# Patient Record
Sex: Female | Born: 1990 | Race: Black or African American | Hispanic: No | Smoking: Never smoker
Health system: Southern US, Community
[De-identification: ages and names within clinical notes are randomized; demographics above are authoritative.]

## PROBLEM LIST (undated history)

## (undated) DIAGNOSIS — Z8669 Personal history of other diseases of the nervous system and sense organs: Secondary | ICD-10-CM

## (undated) DIAGNOSIS — Z86711 Personal history of pulmonary embolism: Secondary | ICD-10-CM

## (undated) DIAGNOSIS — F419 Anxiety disorder, unspecified: Secondary | ICD-10-CM

## (undated) DIAGNOSIS — F32A Depression, unspecified: Secondary | ICD-10-CM

## (undated) DIAGNOSIS — N946 Dysmenorrhea, unspecified: Secondary | ICD-10-CM

## (undated) DIAGNOSIS — I6310 Cerebral infarction due to embolism of unspecified precerebral artery: Secondary | ICD-10-CM

## (undated) DIAGNOSIS — G809 Cerebral palsy, unspecified: Secondary | ICD-10-CM

## (undated) DIAGNOSIS — T7840XA Allergy, unspecified, initial encounter: Secondary | ICD-10-CM

## (undated) DIAGNOSIS — Z8742 Personal history of other diseases of the female genital tract: Secondary | ICD-10-CM

## (undated) HISTORY — DX: Personal history of other diseases of the nervous system and sense organs: Z86.69

## (undated) HISTORY — DX: Anxiety disorder, unspecified: F41.9

## (undated) HISTORY — DX: Personal history of pulmonary embolism: Z86.711

## (undated) HISTORY — DX: Cerebral palsy, unspecified: G80.9

## (undated) HISTORY — DX: Allergy, unspecified, initial encounter: T78.40XA

## (undated) HISTORY — DX: Dysmenorrhea, unspecified: N94.6

## (undated) HISTORY — DX: Personal history of other diseases of the female genital tract: Z87.42

## (undated) HISTORY — DX: Depression, unspecified: F32.A

## (undated) HISTORY — PX: COLPOSCOPY: SHX161

## (undated) HISTORY — DX: Cerebral infarction due to embolism of unspecified precerebral artery: I63.10

---

## 2017-09-18 ENCOUNTER — Ambulatory Visit: Payer: Self-pay | Admitting: Emergency Medicine

## 2017-09-20 ENCOUNTER — Encounter: Payer: Self-pay | Admitting: Obstetrics and Gynecology

## 2017-09-20 ENCOUNTER — Other Ambulatory Visit: Payer: Self-pay

## 2017-09-20 ENCOUNTER — Ambulatory Visit: Payer: BC Managed Care – PPO | Admitting: Obstetrics and Gynecology

## 2017-09-20 VITALS — BP 122/80 | HR 104 | Resp 14 | Ht 64.5 in | Wt 194.0 lb

## 2017-09-20 DIAGNOSIS — Z7189 Other specified counseling: Secondary | ICD-10-CM

## 2017-09-20 DIAGNOSIS — Z8669 Personal history of other diseases of the nervous system and sense organs: Secondary | ICD-10-CM

## 2017-09-20 DIAGNOSIS — Z833 Family history of diabetes mellitus: Secondary | ICD-10-CM | POA: Diagnosis not present

## 2017-09-20 DIAGNOSIS — Z01419 Encounter for gynecological examination (general) (routine) without abnormal findings: Secondary | ICD-10-CM

## 2017-09-20 DIAGNOSIS — Z124 Encounter for screening for malignant neoplasm of cervix: Secondary | ICD-10-CM | POA: Diagnosis not present

## 2017-09-20 DIAGNOSIS — Z7185 Encounter for immunization safety counseling: Secondary | ICD-10-CM

## 2017-09-20 DIAGNOSIS — Z0000 Encounter for general adult medical examination without abnormal findings: Secondary | ICD-10-CM

## 2017-09-20 DIAGNOSIS — Z23 Encounter for immunization: Secondary | ICD-10-CM | POA: Diagnosis not present

## 2017-09-20 MED ORDER — BLISOVI 24 FE 1-20 MG-MCG(24) PO TABS: 1.0000 | ORAL_TABLET | Freq: Every day | ORAL | 3 refills | Status: DC

## 2017-09-20 NOTE — Progress Notes (Signed)
27 y.o. G0P0000 MarriedAfrican AmericanF here for annual exam.   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 Mom had preeclampsia.   Menses q month, occasionally skipped cycles on OCP's. Bleeds x 3-4 day, light. No cramps with OCP's.  Sexually active, no pain. Same partner x 10 years, married in 2016. No current plans for children. Husband is getting his PhD in educational psychology.    Patient's last menstrual period was 09/12/2017.          Sexually active: Yes.    The current method of family planning is OCP (estrogen/progesterone).    Exercising: No.  The patient does not participate in regular exercise at present. Smoker:  no  Health Maintenance: Pap:  2017 WNL per patient  History of abnormal Pap:  Yes 2016 + HR HPV - had colposcopy TDaP:  unsure Gardasil: no    reports that  has never smoked. she has never used smokeless tobacco. She reports that she drinks about 0.6 oz of alcohol per week. She reports that she does not use drugs. 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 Prefers to work with [employer]. Just finished her masters degree in curriculum and instruction. Thinking about changing her job.   Past Medical History:  Diagnosis Date  . Cerebral palsy (HCC)   . Dysmenorrhea   . History of abnormal cervical Pap smear    + HPV  . 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 COLPOSCOPY      Current Outpatient Medications  Medication Sig Dispense Refill  . BLISOVI 24 FE 1-20 MG-MCG(24) tablet TAKE 1 TAB BY MOUTH DAILY. CALL OFFICE FOR APPOINTMENT.  0   No current facility-administered medications for this visit.     Family History  Problem Relation Age of Onset  . Hypertension Mother   . Diabetes Mother     Review of Systems  Constitutional: Negative.   HENT: Negative.   Eyes: Negative.   Respiratory: Negative.   Cardiovascular: Negative.   Gastrointestinal:  Negative.   Endocrine: Negative.   Genitourinary: Negative.   Musculoskeletal: Negative.   Skin: Negative.   Allergic/Immunologic: Negative.   Neurological: Negative.   Psychiatric/Behavioral: Negative.     Exam:   BP 122/80 (BP Location: Right Arm, Patient Position: Sitting, Cuff Size: Normal)   Pulse (!) 104   Resp 14   Ht 5' 4.5" (1.638 m)   Wt 194 lb (88 kg)   LMP 09/12/2017   BMI 32.79 kg/m   Weight change: @WEIGHTCHANGE @ Height:   Height: 5' 4.5" (163.8 cm)  Ht Readings from Last 3 Encounters:  09/20/17 5' 4.5" (1.638 m)    General appearance: alert, cooperative and appears stated age Head: Normocephalic, without obvious abnormality, atraumatic Neck: no adenopathy, supple, symmetrical, trachea midline and thyroid normal to inspection and palpation Lungs: clear to auscultation bilaterally Cardiovascular: regular rate and rhythm Breasts: normal appearance, no masses or tenderness Abdomen: soft, non-tender; non distended,  no masses,  no organomegaly Extremities: extremities normal, atraumatic, no cyanosis or edema Skin: Skin color, texture, turgor normal. No rashes or lesions Lymph nodes: Cervical, supraclavicular, and axillary nodes normal. No abnormal inguinal nodes palpated Neurologic: Grossly normal   Pelvic: External genitalia:  no lesions              Urethra:  normal appearing urethra with no masses, tenderness or lesions  Bartholins and Skenes: normal                 Vagina: normal appearing vagina with normal color and discharge, no lesions              Cervix: no lesions               Bimanual Exam:  Uterus:  normal size, contour, position, consistency, mobility, non-tender and anteverted              Adnexa: no mass, fullness, tenderness               Rectovaginal: Confirms               Anus:  normal sphincter tone, no lesions  Chaperone was present for exam.  A:  Well Woman with normal exam  Contraception, happy with OCP's  H/O  +HPV  H/O preterm birth, stroke, has very mild CP  P:   Pap with reflex hpv  Continue OCP's  TDAP  Gardasil, will start the series today  Screening labs  Discussed breast self exam  Discussed calcium and vit D intake

## 2017-09-20 NOTE — Patient Instructions (Signed)
EXERCISE AND DIET:  We recommended that you start or continue a regular exercise program for good health. Regular exercise means any activity that makes your heart beat faster and makes you sweat.  We recommend exercising at least 30 minutes per day at least 3 days a week, preferably 4 or 5.  We also recommend a diet low in fat and sugar.  Inactivity, poor dietary choices and obesity can cause diabetes, heart attack, stroke, and kidney damage, among others.    ALCOHOL AND SMOKING:  Women should limit their alcohol intake to no more than 7 drinks/beers/glasses of wine (combined, not each!) per week. Moderation of alcohol intake to this level decreases your risk of breast cancer and liver damage. And of course, no recreational drugs are part of a healthy lifestyle.  And absolutely no smoking or even second hand smoke. Most people know smoking can cause heart and lung diseases, but did you know it also contributes to weakening of your bones? Aging of your skin?  Yellowing of your teeth and nails?  CALCIUM AND VITAMIN D:  Adequate intake of calcium and Vitamin D are recommended.  The recommendations for exact amounts of these supplements seem to change often, but generally speaking 600 mg of calcium (either carbonate or citrate) and 800 units of Vitamin D per day seems prudent. Certain women may benefit from higher intake of Vitamin D.  If you are among these women, your doctor will have told you during your visit.    PAP SMEARS:  Pap smears, to check for cervical cancer or precancers,  have traditionally been done yearly, although recent scientific advances have shown that most women can have pap smears less often.  However, every woman still should have a physical exam from her gynecologist every year. It will include a breast check, inspection of the vulva and vagina to check for abnormal growths or skin changes, a visual exam of the cervix, and then an exam to evaluate the size and shape of the uterus and  ovaries.  And after 27 years of age, a rectal exam is indicated to check for rectal cancers. We will also provide age appropriate advice regarding health maintenance, like when you should have certain vaccines, screening for sexually transmitted diseases, bone density testing, colonoscopy, mammograms, etc.   MAMMOGRAMS:  All women over 40 years old should have a yearly mammogram. Many facilities now offer a "3D" mammogram, which may cost around $50 extra out of pocket. If possible,  we recommend you accept the option to have the 3D mammogram performed.  It both reduces the number of women who will be called back for extra views which then turn out to be normal, and it is better than the routine mammogram at detecting truly abnormal areas.    COLONOSCOPY:  Colonoscopy to screen for colon cancer is recommended for all women at age 50.  We know, you hate the idea of the prep.  We agree, BUT, having colon cancer and not knowing it is worse!!  Colon cancer so often starts as a polyp that can be seen and removed at colonscopy, which can quite literally save your life!  And if your first colonoscopy is normal and you have no family history of colon cancer, most women don't have to have it again for 10 years.  Once every ten years, you can do something that may end up saving your life, right?  We will be happy to help you get it scheduled when you are ready.    Be sure to check your insurance coverage so you understand how much it will cost.  It may be covered as a preventative service at no cost, but you should check your particular policy.      Breast Self-Awareness Breast self-awareness means being familiar with how your breasts look and feel. It involves checking your breasts regularly and reporting any changes to your health care provider. Practicing breast self-awareness is important. A change in your breasts can be a sign of a serious medical problem. Being familiar with how your breasts look and feel allows  you to find any problems early, when treatment is more likely to be successful. All women should practice breast self-awareness, including women who have had breast implants. How to do a breast self-exam One way to learn what is normal for your breasts and whether your breasts are changing is to do a breast self-exam. To do a breast self-exam: Look for Changes  1. Remove all the clothing above your waist. 2. Stand in front of a mirror in a room with good lighting. 3. Put your hands on your hips. 4. Push your hands firmly downward. 5. Compare your breasts in the mirror. Look for differences between them (asymmetry), such as: ? Differences in shape. ? Differences in size. ? Puckers, dips, and bumps in one breast and not the other. 6. Look at each breast for changes in your skin, such as: ? Redness. ? Scaly areas. 7. Look for changes in your nipples, such as: ? Discharge. ? Bleeding. ? Dimpling. ? Redness. ? A change in position. Feel for Changes  Carefully feel your breasts for lumps and changes. It is best to do this while lying on your back on the floor and again while sitting or standing in the shower or tub with soapy water on your skin. Feel each breast in the following way:  Place the arm on the side of the breast you are examining above your head.  Feel your breast with the other hand.  Start in the nipple area and make  inch (2 cm) overlapping circles to feel your breast. Use the pads of your three middle fingers to do this. Apply light pressure, then medium pressure, then firm pressure. The light pressure will allow you to feel the tissue closest to the skin. The medium pressure will allow you to feel the tissue that is a little deeper. The firm pressure will allow you to feel the tissue close to the ribs.  Continue the overlapping circles, moving downward over the breast until you feel your ribs below your breast.  Move one finger-width toward the center of the body.  Continue to use the  inch (2 cm) overlapping circles to feel your breast as you move slowly up toward your collarbone.  Continue the up and down exam using all three pressures until you reach your armpit.  Write Down What You Find  Write down what is normal for each breast and any changes that you find. Keep a written record with breast changes or normal findings for each breast. By writing this information down, you do not need to depend only on memory for size, tenderness, or location. Write down where you are in your menstrual cycle, if you are still menstruating. If you are having trouble noticing differences in your breasts, do not get discouraged. With time you will become more familiar with the variations in your breasts and more comfortable with the exam. How often should I examine my breasts? Examine   your breasts every month. If you are breastfeeding, the best time to examine your breasts is after a feeding or after using a breast pump. If you menstruate, the best time to examine your breasts is 5-7 days after your period is over. During your period, your breasts are lumpier, and it may be more difficult to notice changes. When should I see my health care provider? See your health care provider if you notice:  A change in shape or size of your breasts or nipples.  A change in the skin of your breast or nipples, such as a reddened or scaly area.  Unusual discharge from your nipples.  A lump or thick area that was not there before.  Pain in your breasts.  Anything that concerns you.  This information is not intended to replace advice given to you by your health care provider. Make sure you discuss any questions you have with your health care provider. Document Released: 07/10/2005 Document Revised: 12/16/2015 Document Reviewed: 05/30/2015 Elsevier Interactive Patient Education  2018 Elsevier Inc.  

## 2017-09-21 ENCOUNTER — Other Ambulatory Visit (HOSPITAL_COMMUNITY)
Admission: RE | Admit: 2017-09-21 | Discharge: 2017-09-21 | Disposition: A | Discharge status: Home / Self Care | Payer: BC Managed Care – PPO | Source: Ambulatory Visit | Attending: Obstetrics and Gynecology | Admitting: Obstetrics and Gynecology

## 2017-09-21 DIAGNOSIS — Z124 Encounter for screening for malignant neoplasm of cervix: Secondary | ICD-10-CM | POA: Insufficient documentation

## 2017-09-21 LAB — COMPREHENSIVE METABOLIC PANEL
A/G RATIO: 1.1 — AB (ref 1.2–2.2)
ALK PHOS: 43 IU/L (ref 39–117)
ALT: 11 IU/L (ref 0–32)
AST: 14 IU/L (ref 0–40)
Albumin: 4.1 g/dL (ref 3.5–5.5)
BUN/Creatinine Ratio: 12 (ref 9–23)
BUN: 9 mg/dL (ref 6–20)
Bilirubin Total: 0.2 mg/dL (ref 0.0–1.2)
CALCIUM: 9.6 mg/dL (ref 8.7–10.2)
CHLORIDE: 103 mmol/L (ref 96–106)
CO2: 25 mmol/L (ref 20–29)
Creatinine, Ser: 0.74 mg/dL (ref 0.57–1.00)
GFR calc Af Amer: 128 mL/min/{1.73_m2} (ref >59)
GFR, EST NON AFRICAN AMERICAN: 111 mL/min/{1.73_m2} (ref >59)
Globulin, Total: 3.9 g/dL (ref 1.5–4.5)
Glucose: 76 mg/dL (ref 65–99)
POTASSIUM: 4 mmol/L (ref 3.5–5.2)
SODIUM: 140 mmol/L (ref 134–144)
Total Protein: 8 g/dL (ref 6.0–8.5)

## 2017-09-21 LAB — HEMOGLOBIN A1C
Est. average glucose Bld gHb Est-mCnc: 103 mg/dL
Hgb A1c MFr Bld: 5.2 % (ref 4.8–5.6)

## 2017-09-21 LAB — CBC
Hematocrit: 40 % (ref 34.0–46.6)
Hemoglobin: 13 g/dL (ref 11.1–15.9)
MCH: 30.1 pg (ref 26.6–33.0)
MCHC: 32.5 g/dL (ref 31.5–35.7)
MCV: 93 fL (ref 79–97)
PLATELETS: 449 10*3/uL — AB (ref 150–379)
RBC: 4.32 x10E6/uL (ref 3.77–5.28)
RDW: 12.8 % (ref 12.3–15.4)
WBC: 8.5 10*3/uL (ref 3.4–10.8)

## 2017-09-21 LAB — LIPID PANEL
CHOLESTEROL TOTAL: 135 mg/dL (ref 100–199)
Chol/HDL Ratio: 2.8 ratio (ref 0.0–4.4)
HDL: 49 mg/dL (ref >39)
LDL Calculated: 67 mg/dL (ref 0–99)
TRIGLYCERIDES: 93 mg/dL (ref 0–149)
VLDL Cholesterol Cal: 19 mg/dL (ref 5–40)

## 2017-09-21 NOTE — Addendum Note (Signed)
Addended by: Tobi BastosJERTSON, JILL E on: 09/21/2017 05:11 PM   Modules accepted: Orders

## 2017-09-25 LAB — CYTOLOGY - PAP: Diagnosis: NEGATIVE

## 2017-10-17 ENCOUNTER — Other Ambulatory Visit: Payer: Self-pay

## 2017-10-17 ENCOUNTER — Ambulatory Visit: Payer: BC Managed Care – PPO | Admitting: Emergency Medicine

## 2017-10-17 ENCOUNTER — Encounter: Payer: Self-pay | Admitting: Emergency Medicine

## 2017-10-17 VITALS — BP 123/81 | HR 85 | Temp 98.6°F | Resp 16 | Ht 65.25 in | Wt 193.2 lb

## 2017-10-17 DIAGNOSIS — M25551 Pain in right hip: Secondary | ICD-10-CM | POA: Insufficient documentation

## 2017-10-17 DIAGNOSIS — G8191 Hemiplegia, unspecified affecting right dominant side: Secondary | ICD-10-CM | POA: Insufficient documentation

## 2017-10-17 DIAGNOSIS — M545 Low back pain, unspecified: Secondary | ICD-10-CM

## 2017-10-17 DIAGNOSIS — Z7689 Persons encountering health services in other specified circumstances: Secondary | ICD-10-CM

## 2017-10-17 DIAGNOSIS — M25552 Pain in left hip: Secondary | ICD-10-CM | POA: Diagnosis not present

## 2017-10-17 DIAGNOSIS — Z8669 Personal history of other diseases of the nervous system and sense organs: Secondary | ICD-10-CM

## 2017-10-17 DIAGNOSIS — M549 Dorsalgia, unspecified: Secondary | ICD-10-CM | POA: Insufficient documentation

## 2017-10-17 NOTE — Patient Instructions (Addendum)
     IF you received an x-ray today, you will receive an invoice from [facility]. Please contact [facility] at [phone] with questions or concerns regarding your invoice.   IF you received labwork today, you will receive an invoice from [facility]. Please contact [facility] at [phone] with questions or concerns regarding your invoice.   Our billing staff will not be able to assist you with questions regarding bills from these companies.  You will be contacted with the lab results as soon as they are available. The fastest way to get your results is to activate your My Chart account. Instructions are located on the last page of this paperwork. If you have not heard from us regarding the results in 2 weeks, please contact this office.     Hip Pain The hip is the joint between the upper legs and the lower pelvis. The bones, cartilage, tendons, and muscles of your hip joint support your body and allow you to move around. Hip pain can range from a minor ache to severe pain in one or both of your hips. The pain may be felt on the inside of the hip joint near the groin, or the outside near the buttocks and upper thigh. You may also have swelling or stiffness. Follow these instructions at home: Managing pain, stiffness, and swelling  If directed, apply ice to the injured area. ? Put ice in a plastic bag. ? Place a towel between your skin and the bag. ? Leave the ice on for 20 minutes, 2-3 times a day  Sleep with a pillow between your legs on your most comfortable side.  Avoid any activities that cause pain. General instructions  Take over-the-counter and prescription medicines only as told by your health care provider.  Do any exercises as told by your health care provider.  Record the following: ? How often you have hip pain. ? The location of your pain. ? What the pain feels like. ? What makes the pain worse.  Keep all follow-up visits as told by your health  care provider. This is important. Contact a health care provider if:  You cannot put weight on your leg.  Your pain or swelling continues or gets worse after one week.  It gets harder to walk.  You have a fever. Get help right away if:  You fall.  You have a sudden increase in pain and swelling in your hip.  Your hip is red or swollen or very tender to touch. Summary  Hip pain can range from a minor ache to severe pain in one or both of your hips.  The pain may be felt on the inside of the hip joint near the groin, or the outside near the buttocks and upper thigh.  Avoid any activities that cause pain.  Record how often you have hip pain, the location of the pain, what makes it worse and what it feels like. This information is not intended to replace advice given to you by your health care provider. Make sure you discuss any questions you have with your health care provider. Document Released: 12/28/2009 Document Revised: 06/12/2016 Document Reviewed: 06/12/2016 Elsevier Interactive Patient Education  2018 Elsevier Inc.  

## 2017-10-17 NOTE — Progress Notes (Signed)
Elizabeth Stanton 27 y.o.   Chief Complaint  Patient presents with  . Establish Care    PATIENT HAS HEMIPLEGIA,  REFERRAL - PATIENT WILL DISCUSS REASON    HISTORY OF PRESENT ILLNESS: This is a 27 y.o. female here to establish care.  Has a history of CP with right-sided hemiplegia.  Concerned about her low back and hips.  States she was told right hip is higher than the left hip.  Sometimes after sitting down for 20-30 minutes she gets up, feels sharp pain to her hips, feels unsteady, legs feel weak, but has some trouble walking.  After short while symptoms get better.  Requesting referral to orthopedics.  Recently seen by her GYN doctor who did some blood work and a Pap smear.  All results within normal limits.  Reviewed with patient. HPI   Prior to Admission medications   Medication Sig Start Date End Date Taking? Authorizing Provider  BLISOVI 24 FE 1-20 MG-MCG(24) tablet Take 1 tablet by mouth daily. 09/20/17  Yes Romualdo Bolk, MD    Allergies  Allergen Reactions  . Lavender Oil Hives    Patient Active Problem List   Diagnosis Date Noted  . History of cerebral palsy     Past Medical History:  Diagnosis Date  . Cerebral palsy (HCC)   . Dysmenorrhea   . History of abnormal cervical Pap smear    + HPV  . History of cerebral palsy   . 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 COLPOSCOPY      Social History   Socioeconomic History  . Marital status: Married    Spouse name: Not on file  . Number of children: Not on file  . Years of education: Not on file  . Highest education level: Not on file  Occupational History  . Not on file  Social Needs  . Financial resource strain: Not on file  . Food insecurity:    Worry: Not on file    Inability: Not on file  . Transportation needs:    Medical: Not on file    Non-medical: Not on file  Tobacco Use  . Smoking status: Never Smoker  . Smokeless  tobacco: Never Used  Substance and Sexual Activity  . Alcohol use: Yes    Alcohol/week: 0.6 oz    Types: 1 Standard drinks or equivalent per week  . Drug use: No  . Sexual activity: Yes    Partners: Male    Birth control/protection: Pill  Lifestyle  . Physical activity:    Days per week: Not on file    Minutes per session: Not on file  . Stress: Not on file  Relationships  . Social connections:    Talks on phone: Not on file    Gets together: Not on file    Attends religious service: Not on file    Active member of club or organization: Not on file    Attends meetings of clubs or organizations: Not on file    Relationship status: Not on file  . Intimate partner violence:    Fear of current or ex partner: Not on file    Emotionally abused: Not on file    Physically abused: Not on file    Forced sexual activity: Not on file  Other Topics Concern  . Not on file  Social History Narrative  . Not on file    Family History  Problem Relation Age of Onset  . Hypertension Mother   . Diabetes Mother      Review of Systems  Constitutional: Negative.  Negative for chills, fever and weight loss.  HENT: Negative.  Negative for congestion, nosebleeds and sore throat.   Eyes: Negative.  Negative for blurred vision and double vision.  Respiratory: Negative.  Negative for cough, hemoptysis, shortness of breath and wheezing.   Cardiovascular: Negative.  Negative for chest pain, palpitations and leg swelling.  Gastrointestinal: Negative.  Negative for abdominal pain, blood in stool, diarrhea, heartburn, melena, nausea and vomiting.  Genitourinary: Negative.  Negative for dysuria and hematuria.  Musculoskeletal: Positive for back pain and joint pain (Bilateral hips).  Skin: Negative.  Negative for rash.  Neurological: Negative.  Negative for dizziness, sensory change, focal weakness, weakness and headaches.  Endo/Heme/Allergies: Negative.   All other systems reviewed and are  negative.   Vitals:   10/17/17 0837  BP: 123/81  Pulse: 85  Resp: 16  Temp: 98.6 F (37 C)  SpO2: 98%    Physical Exam  Constitutional: She is oriented to person, place, and time. She appears well-developed and well-nourished.  HENT:  Head: Normocephalic and atraumatic.  Right Ear: External ear normal.  Left Ear: External ear normal.  Nose: Nose normal.  Mouth/Throat: Oropharynx is clear and moist.  Eyes: Pupils are equal, round, and reactive to light. Conjunctivae and EOM are normal.  Neck: Normal range of motion. Neck supple. No thyromegaly present.  Cardiovascular: Normal rate, regular rhythm and normal heart sounds.  Pulmonary/Chest: Effort normal and breath sounds normal. No respiratory distress.  Abdominal: Soft. Bowel sounds are normal. She exhibits no distension. There is no tenderness.  Musculoskeletal:       Lumbar back: She exhibits decreased range of motion and tenderness. She exhibits no bony tenderness, no spasm and normal pulse.  Hips: Nontender.  Fairly good range of motion.  Lymphadenopathy:    She has no cervical adenopathy.  Neurological: She is alert and oriented to person, place, and time. No sensory deficit. She exhibits normal muscle tone.  Right-sided hemiplegia.  Some atrophy noted on the right arm.  Skin: Skin is warm and dry. Capillary refill takes less than 2 seconds. No rash noted.  Psychiatric: She has a normal mood and affect. Her behavior is normal.  Vitals reviewed.  A total of 30 minutes was spent in the room with the patient, greater than 50% of which was in counseling/coordination of care.   ASSESSMENT & PLAN: Elizabeth Stanton was seen today for establish care.  Diagnoses and all orders for this visit:  Lumbar pain -     Ambulatory referral to Orthopedic Surgery  Bilateral hip pain -     Ambulatory referral to Orthopedic Surgery  History of cerebral palsy  Right hemiplegia (HCC)  Encounter to establish care    Patient Instructions        IF you received an x-ray today, you will receive an invoice from [facility]. Please contact [facility] at [phone] with questions or concerns regarding your invoice.   IF you received labwork today, you will receive an invoice from [facility]. Please contact [facility] at [phone] with questions or concerns regarding your invoice.   Our billing staff will not be able to assist you with questions regarding bills from these companies.  You will be contacted with the lab results as soon as they are available. The fastest way to get your results is to activate your My Chart account. Instructions  are located on the last page of this paperwork. If you have not heard from [place] regarding the results in 2 weeks, please contact this office.     Hip Pain The hip is the joint between the upper legs and the lower pelvis. The bones, cartilage, tendons, and muscles of your hip joint support your body and allow you to move around. Hip pain can range from a minor ache to severe pain in one or both of your hips. The pain may be felt on the inside of the hip joint near the groin, or the outside near the buttocks and upper thigh. You may also have swelling or stiffness. Follow these instructions at home: Managing pain, stiffness, and swelling  If directed, apply ice to the injured area. ? Put ice in a plastic bag. ? Place a towel between your skin and the bag. ? Leave the ice on for 20 minutes, 2-3 times a day  Sleep with a pillow between your legs on your most comfortable side.  Avoid any activities that cause pain. General instructions  Take over-the-counter and prescription medicines only as told by your health care provider.  Do any exercises as told by your health care provider.  Record the following: ? How often you have hip pain. ? The location of your pain. ? What the pain feels like. ? What makes the pain worse.  Keep all follow-up visits as told by your  health care provider. This is important. Contact a health care provider if:  You cannot put weight on your leg.  Your pain or swelling continues or gets worse after one week.  It gets harder to walk.  You have a fever. Get help right away if:  You fall.  You have a sudden increase in pain and swelling in your hip.  Your hip is red or swollen or very tender to touch. Summary  Hip pain can range from a minor ache to severe pain in one or both of your hips.  The pain may be felt on the inside of the hip joint near the groin, or the outside near the buttocks and upper thigh.  Avoid any activities that cause pain.  Record how often you have hip pain, the location of the pain, what makes it worse and what it feels like. This information is not intended to replace advice given to you by your health care provider. Make sure you discuss any questions you have with your health care provider. Document Released: 12/28/2009 Document Revised: 06/12/2016 Document Reviewed: 06/12/2016 Elsevier Interactive Patient Education  2018 Elsevier Inc.      Edwina Barth, MD [facility]

## 2017-10-23 ENCOUNTER — Ambulatory Visit (INDEPENDENT_AMBULATORY_CARE_PROVIDER_SITE_OTHER): Payer: BC Managed Care – PPO | Admitting: Orthopaedic Surgery

## 2017-10-29 ENCOUNTER — Other Ambulatory Visit (INDEPENDENT_AMBULATORY_CARE_PROVIDER_SITE_OTHER): Payer: Self-pay | Admitting: Radiology

## 2017-10-29 ENCOUNTER — Ambulatory Visit (INDEPENDENT_AMBULATORY_CARE_PROVIDER_SITE_OTHER): Payer: BC Managed Care – PPO | Admitting: Orthopaedic Surgery

## 2017-10-29 ENCOUNTER — Ambulatory Visit (INDEPENDENT_AMBULATORY_CARE_PROVIDER_SITE_OTHER): Payer: Self-pay

## 2017-10-29 ENCOUNTER — Encounter (INDEPENDENT_AMBULATORY_CARE_PROVIDER_SITE_OTHER): Payer: Self-pay | Admitting: Orthopaedic Surgery

## 2017-10-29 VITALS — BP 120/84 | HR 80 | Resp 18 | Ht 63.0 in | Wt 190.0 lb

## 2017-10-29 DIAGNOSIS — G8929 Other chronic pain: Secondary | ICD-10-CM | POA: Diagnosis not present

## 2017-10-29 DIAGNOSIS — M545 Low back pain, unspecified: Secondary | ICD-10-CM

## 2017-10-29 NOTE — Progress Notes (Signed)
Office Visit Note   Patient: Elizabeth SimpsonCaptoria Schwegel           Date of Birth: 12/12/1990           MRN: [id number] Visit Date: 10/29/2017              Requested by: Georgina QuintSagardia, Miguel Jose, MD [address] [place], [place] [id number] PCP: Georgina QuintSagardia, Miguel Jose, MD   Assessment & Plan: Visit Diagnoses:  1. Chronic midline low back pain without sciatica     Plan: Probable musculoligamentous origin for low back pain without evidence of radiculopathy.  Long discussion regarding x-ray findings and need for physical therapy.  We will set this up and check her back in 6 weeks or sooner if there is no improvement.  Follow-Up Instructions: No follow-ups on file.   Orders:  Orders Placed This Encounter  Procedures  . XR Lumbar Spine 2-3 Views  . XR Pelvis 1-2 Views   No orders of the defined types were placed in this encounter.     Procedures: No procedures performed   Clinical Data: No additional findings.   Subjective: Chief Complaint  Patient presents with  . Left Hip - Pain  . Right Hip - Pain  . Right Knee - Pain  . Left Knee - Pain  . Lower Back - Pain  . New Patient (Initial Visit)    LOWER BACK, BIL LAT HIP AND KNEE PAIN FOR 10 YEARS. HEMOPLESIA   Mrs. Elizabeth Stanton relates at least a 5-year history of recurrent low back pain.  Pain  seems to originate lower part of her back and radiate to the one buttock or the other.  Occasionally she has had some knee pain.  No radicular symptoms.  Just returned with her husband from a road trip to [place] and notes that she has had an exacerbation of her pain to the point where she is compromised.  She is having trouble sleeping.  Has tried some over-the-counter medicines. Has a history of hemiplegia  since birth affecting more of her right upper than her right lower extremity.  Denies any hip discomfort or problem with her back up until 5 years ago  HPI  Review of Systems  Constitutional: Negative for fever.  HENT: Negative for ear pain.   Eyes:  Negative for pain.  Respiratory: Negative for cough and shortness of breath.   Cardiovascular: Negative for leg swelling.  Gastrointestinal: Negative for constipation and diarrhea.  Genitourinary: Negative for difficulty urinating.  Musculoskeletal: Positive for back pain. Negative for neck pain.  Skin: Negative for rash.  Allergic/Immunologic: Negative for food allergies.  Neurological: Positive for weakness. Negative for dizziness, numbness and headaches.  Hematological: Bruises/bleeds easily.  Psychiatric/Behavioral: Negative for sleep disturbance.     Objective: Vital Signs: BP 120/84 (BP Location: Left Arm, Patient Position: Sitting, Cuff Size: Normal)   Pulse 80   Resp 18   Ht 5\' 3"  (1.6 m)   Wt 190 lb (86.2 kg)   LMP 10/10/2017   BMI 33.66 kg/m   Physical Exam  Ortho Exam awake alert and oriented x3.  Comfortable sitting hyperreflexic right upper and lower extremity.  Straight leg raise negative.  Some percussible tenderness of the lumbar spine.  Painless range of motion of both hips with internal and external rotation.  Functions without braces.  Atrophy of both right upper and right lower extremity related to her neurologic deficits. Right upper extremity postures with increased reflexes.  Positive stretch reflexes in both upper and lower extremities  walks with a mildly spastic limp to right lower extremity.  Motor exam intact right foot with active dorsiflexion plantarflexion inversion and Specialty Comments:  No specialty comments available.  Imaging: No results found.   PMFS History: Patient Active Problem List   Diagnosis Date Noted  . Lumbar pain 10/17/2017  . Bilateral hip pain 10/17/2017  . Right hemiplegia (HCC) 10/17/2017  . History of cerebral palsy    Past Medical History:  Diagnosis Date  . Cerebral palsy (HCC)   . Dysmenorrhea   . History of abnormal cervical Pap smear    + HPV  . History of cerebral palsy   . 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 Hypertension Mother   . Diabetes Mother   . Hypertension Maternal Grandmother   . Cancer Paternal Grandfather        skin    Past Surgical History:  Procedure Laterality Date  . COLPOSCOPY     Social History   Occupational History  . Not on file  Tobacco Use  . Smoking status: Never Smoker  . Smokeless tobacco: Never Used  Substance and Sexual Activity  . Alcohol use: Yes    Alcohol/week: 0.6 oz    Types: 1 Standard drinks or equivalent per week    Comment: wine  . Drug use: No  . Sexual activity: Yes    Partners: Male    Birth control/protection: Pill

## 2017-11-19 ENCOUNTER — Ambulatory Visit (INDEPENDENT_AMBULATORY_CARE_PROVIDER_SITE_OTHER): Payer: BC Managed Care – PPO

## 2017-11-19 VITALS — BP 138/80 | HR 76 | Ht 64.5 in | Wt 194.0 lb

## 2017-11-19 DIAGNOSIS — Z23 Encounter for immunization: Secondary | ICD-10-CM

## 2017-11-19 NOTE — Progress Notes (Signed)
Patient here today for 2nd Gardasil vaccine which is within dates.  BC--OCPs  Patient given Gardasil vaccine in Left deltoid and tolerated injection well. Her next injection will be due in 4 months.

## 2017-11-20 ENCOUNTER — Ambulatory Visit: Payer: BC Managed Care – PPO | Admitting: Physical Therapy

## 2018-01-02 ENCOUNTER — Encounter: Payer: Self-pay | Admitting: Obstetrics and Gynecology

## 2018-01-02 ENCOUNTER — Telehealth: Payer: Self-pay | Admitting: Obstetrics and Gynecology

## 2018-01-02 NOTE — Telephone Encounter (Signed)
Message   ----- Message from Mychart, Generic sent at 01/02/2018 12:54 AM EDT -----    Hello Dr. Oscar LaJertson,     I was looking at my blood work and was just wondering if these results also tested for STIs or HIV. If not is there a way that I can request that I be tested. I have only been with my husband however, I am not sure that I have ever been tested. Thank you in advance for your response.

## 2018-01-11 ENCOUNTER — Ambulatory Visit: Payer: BC Managed Care – PPO | Admitting: Obstetrics and Gynecology

## 2018-02-19 ENCOUNTER — Ambulatory Visit (INDEPENDENT_AMBULATORY_CARE_PROVIDER_SITE_OTHER): Payer: BC Managed Care – PPO

## 2018-02-19 VITALS — BP 115/68 | HR 68 | Resp 14 | Ht 63.0 in | Wt 196.0 lb

## 2018-02-19 DIAGNOSIS — Z23 Encounter for immunization: Secondary | ICD-10-CM

## 2018-02-19 NOTE — Progress Notes (Signed)
Patient in today for 3rd Gardasil injection.   Contraception: OCP LMP: 02/05/18 Last AEX: 09/20/17 with JJ  Injection given in Left Deltoid  Patient tolerated shot well.   Patient informed this is her last injection  Advised patient, if not on birth control, to return for next injection with cycle.   Routed to provider for final review.  Encounter closed.

## 2018-08-11 ENCOUNTER — Other Ambulatory Visit: Payer: Self-pay | Admitting: Obstetrics and Gynecology

## 2018-08-12 NOTE — Telephone Encounter (Signed)
Medication refill request: BLISOVI Last AEX:  09/20/17 JJ Next AEX: 10/10/18 Last MMG (if hormonal medication request): n/a Refill authorized: 09/20/17 #3 w/3 refills; today please advise. Quantity and number of refills adjusted in order to last patient until her AEX.

## 2018-09-05 ENCOUNTER — Other Ambulatory Visit: Payer: Self-pay | Admitting: Obstetrics and Gynecology

## 2018-09-16 ENCOUNTER — Other Ambulatory Visit: Payer: Self-pay

## 2018-09-16 ENCOUNTER — Ambulatory Visit (HOSPITAL_COMMUNITY)
Admission: EM | Admit: 2018-09-16 | Discharge: 2018-09-16 | Disposition: A | Discharge status: Home / Self Care | Payer: BC Managed Care – PPO | Attending: Family Medicine | Admitting: Family Medicine

## 2018-09-16 ENCOUNTER — Encounter (HOSPITAL_COMMUNITY): Payer: Self-pay | Admitting: Emergency Medicine

## 2018-09-16 DIAGNOSIS — J069 Acute upper respiratory infection, unspecified: Secondary | ICD-10-CM

## 2018-09-16 MED ORDER — TRIAMCINOLONE ACETONIDE 55 MCG/ACT NA AERO: 2.0000 | INHALATION_SPRAY | Freq: Every day | NASAL | 0 refills | Status: DC

## 2018-09-16 MED ORDER — BENZONATATE 200 MG PO CAPS: 200.0000 mg | ORAL_CAPSULE | Freq: Two times a day (BID) | ORAL | 0 refills | Status: DC | PRN

## 2018-09-16 NOTE — ED Triage Notes (Signed)
Cough and both ears hurting "feels like fluid in ears".  Having pain behind right eye.  No known fever, non -productive cough, has throat drainage

## 2018-09-16 NOTE — Discharge Instructions (Signed)
Rest Lots of fluids.  Tea and honey is a good idea Take Tessalon 2 times a day.  This is for cough Use Nasacort to help with the sinus congestion and drainage Expect improvement over the next few days

## 2018-09-16 NOTE — ED Provider Notes (Signed)
[facility]    CSN: [id number] Arrival date & time: 09/16/18  1641     History   Chief Complaint Chief Complaint  Patient presents with  . Cough  . Appointment    5:00pm    HPI Elizabeth Stanton is a 28 y.o. female.   HPI  Patient is here with nasal pressure, nasal congestion, postnasal drip.  Ear pressure and discomfort.  Sore throat.  She has coughing and some clear mucus.  No chest pain.  Low-grade fever.  No sweats or chills.  No body aches.  She has a headache.  She is a Runner, broadcasting/film/video.  This is been going on for 2 or 3 days.    Past Medical History:  Diagnosis Date  . Cerebral palsy (HCC)   . Dysmenorrhea   . History of abnormal cervical Pap smear    + HPV  . History of cerebral palsy   . 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 [facility])     Patient Active Problem List   Diagnosis Date Noted  . Lumbar pain 10/17/2017  . Bilateral hip pain 10/17/2017  . Right hemiplegia (HCC) 10/17/2017  . History of cerebral palsy     Past Surgical History:  Procedure Laterality Date  . COLPOSCOPY      OB History    Gravida  0   Para  0   Term  0   Preterm  0   AB  0   Living  0     SAB  0   TAB  0   Ectopic  0   Multiple  0   Live Births  0            Home Medications    Prior to Admission medications   Medication Sig Start Date End Date Taking? Authorizing Provider  benzonatate (TESSALON) 200 MG capsule Take 1 capsule (200 mg total) by mouth 2 (two) times daily as needed for cough. 09/16/18   Eustace Moore, MD  BLISOVI 24 FE 1-20 MG-MCG(24) tablet TAKE 1 TABLET BY MOUTH EVERY DAY 08/13/18   Romualdo Bolk, MD  triamcinolone (NASACORT) 55 MCG/ACT AERO nasal inhaler Place 2 sprays into the nose daily. 09/16/18   Eustace Moore, MD    Family History Family History  Problem Relation Age of Onset  . Hypertension Mother   . Diabetes Mother   . Hypertension Maternal Grandmother   . Cancer Paternal Grandfather        skin     Social History Social History   Tobacco Use  . Smoking status: Never Smoker  . Smokeless tobacco: Never Used  Substance Use Topics  . Alcohol use: Yes    Alcohol/week: 1.0 standard drinks    Types: 1 Standard drinks or equivalent per week    Comment: wine  . Drug use: No     Allergies   Lavender oil   Review of Systems Review of Systems  Constitutional: Positive for fatigue and fever. Negative for chills.  HENT: Positive for congestion, postnasal drip and rhinorrhea. Negative for ear pain and sore throat.   Eyes: Negative for pain and visual disturbance.  Respiratory: Positive for cough. Negative for shortness of breath.   Cardiovascular: Negative for chest pain and palpitations.  Gastrointestinal: Negative for abdominal pain and vomiting.  Genitourinary: Negative for dysuria and hematuria.  Musculoskeletal: Negative for arthralgias and back pain.  Skin: Negative for color change and rash.  Neurological: Positive for weakness. Negative for seizures and  syncope.       Cerebral palsy  All other systems reviewed and are negative.    Physical Exam Triage Vital Signs ED Triage Vitals  Enc Vitals Group     BP 09/16/18 1708 (!) 132/95     Pulse Rate 09/16/18 1708 98     Resp 09/16/18 1708 18     Temp 09/16/18 1708 98.2 F (36.8 C)     Temp Source 09/16/18 1708 Oral     SpO2 09/16/18 1708 98 %     Weight --      Height --      Head Circumference --      Peak Flow --      Pain Score 09/16/18 1705 6     Pain Loc --      Pain Edu? --      Excl. in GC? --    No data found.  Updated Vital Signs BP (!) 132/95 (BP Location: Right Arm)   Pulse 98   Temp 98.2 F (36.8 C) (Oral)   Resp 18   LMP 09/12/2018   SpO2 98%      Physical Exam Constitutional:      General: She is not in acute distress.    Appearance: She is well-developed. She is ill-appearing.     Comments: Congested  HENT:     Head: Normocephalic and atraumatic.     Right Ear: Tympanic  membrane, ear canal and external ear normal.     Left Ear: Tympanic membrane, ear canal and external ear normal.     Nose: No congestion or rhinorrhea.     Mouth/Throat:     Mouth: Mucous membranes are moist.     Pharynx: Posterior oropharyngeal erythema present.     Comments: Posterior pharyngeal injection.  No exudate Eyes:     Conjunctiva/sclera: Conjunctivae normal.     Pupils: Pupils are equal, round, and reactive to light.  Neck:     Musculoskeletal: Normal range of motion.  Cardiovascular:     Rate and Rhythm: Normal rate and regular rhythm.     Heart sounds: Normal heart sounds.  Pulmonary:     Effort: Pulmonary effort is normal. No respiratory distress.     Breath sounds: Normal breath sounds.  Abdominal:     General: Abdomen is flat. There is no distension.     Palpations: Abdomen is soft.  Musculoskeletal: Normal range of motion.  Lymphadenopathy:     Cervical: Cervical adenopathy present.  Skin:    General: Skin is warm and dry.  Neurological:     Mental Status: She is alert.     Comments: Right arm atrophy and weakness  Psychiatric:        Mood and Affect: Mood normal.        Behavior: Behavior normal.      UC Treatments / Results  Labs (all labs ordered are listed, but only abnormal results are displayed) Labs Reviewed - No data to display  EKG None  Radiology No results found.  Procedures Procedures (including critical care time)  Medications Ordered in UC Medications - No data to display  Initial Impression / Assessment and Plan / UC Course  I have reviewed the triage vital signs and the nursing notes.  Pertinent labs & imaging results that were available during my care of the patient were reviewed by me and considered in my medical decision making (see chart for details).     I reviewed with the patient symptomatic care of  viral upper respiratory infection.  Explained that antibiotics will not help her. Final Clinical Impressions(s) / UC  Diagnoses   Final diagnoses:  Acute upper respiratory infection     Discharge Instructions     Rest Lots of fluids.  Tea and honey is a good idea Take Tessalon 2 times a day.  This is for cough Use Nasacort to help with the sinus congestion and drainage Expect improvement over the next few days   ED Prescriptions    Medication Sig Dispense Auth. Provider   benzonatate (TESSALON) 200 MG capsule Take 1 capsule (200 mg total) by mouth 2 (two) times daily as needed for cough. 20 capsule Eustace Moore, MD   triamcinolone (NASACORT) 55 MCG/ACT AERO nasal inhaler Place 2 sprays into the nose daily. 1 Inhaler Eustace Moore, MD     Controlled Substance Prescriptions [place] Controlled Substance Registry consulted? Not Applicable   Eustace Moore, MD 09/16/18 [phone]

## 2018-10-08 NOTE — Progress Notes (Signed)
28 y.o. G0P0000 Married Black or [place] American Not Hispanic or Latino female here for annual exam. She is on OCP's, doing well. No dyspareunia.     Period Cycle (Days): 28 Period Duration (Days): 3-4 days Period Pattern: Regular Menstrual Flow: Moderate, Light Menstrual Control: Tampon, Thin pad, Panty liner Menstrual Control Change Freq (Hours): changes tamon/pad every 4 hours Dysmenorrhea: None  Patient's last menstrual period was 09/13/2018 (exact date).          Sexually active: Yes.    The current method of family planning is OCP (estrogen/progesterone).    Exercising: No.  The patient does not participate in regular exercise at present. Smoker:  no  Health Maintenance: Pap:  09/21/2017 WNL, 2017 WNL per patient  History of abnormal Pap:  Yes 2016 + HR HPV - had colposcopy TDaP: 09/20/2017 Gardasil: completed all 3   reports that she has never smoked. She has never used smokeless tobacco. She reports current alcohol use of about 1.0 standard drinks of alcohol per week. She reports that she does not use drugs. She is a high school social studies Runner, broadcasting/film/video. She has her masters degree in curriculum and instruction. Her husband is getting his PhD in educational psychology.  Past Medical History:  Diagnosis Date  . Cerebral palsy (HCC)   . Dysmenorrhea   . History of abnormal cervical Pap smear    + HPV  . History of cerebral palsy   . 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 Mom had preeclampsia.  Past Surgical History:  Procedure Laterality Date  . COLPOSCOPY      Current Outpatient Medications  Medication Sig Dispense Refill  . BLISOVI 24 FE 1-20 MG-MCG(24) tablet TAKE 1 TABLET BY MOUTH EVERY DAY 28 tablet 1   No current facility-administered medications for this visit.     Family History  Problem Relation Age of Onset  . Hypertension Mother   . Diabetes Mother   . Hypertension  Maternal Grandmother   . Cancer Paternal Grandfather        skin    Review of Systems  Constitutional: Negative.   HENT: Negative.   Eyes: Negative.   Respiratory: Negative.   Cardiovascular: Negative.   Gastrointestinal: Negative.   Endocrine: Negative.   Genitourinary: Negative.   Musculoskeletal: Positive for joint swelling.       Joint pain Muscle weakness  Skin: Negative.   Allergic/Immunologic: Negative.   Neurological: Negative.   Hematological: Negative.   Psychiatric/Behavioral: Negative.     Exam:   BP 120/68 (BP Location: Right Arm, Patient Position: Sitting, Cuff Size: Normal)   Pulse 60   Ht 5\' 5"  (1.651 m)   Wt 189 lb 9.6 oz (86 kg)   LMP 09/13/2018 (Exact Date)   BMI 31.55 kg/m   Weight change: @WEIGHTCHANGE @ Height:   Height: 5\' 5"  (165.1 cm)  Ht Readings from Last 3 Encounters:  10/10/18 5\' 5"  (1.651 m)  02/19/18 5\' 3"  (1.6 m)  11/19/17 5' 4.5" (1.638 m)    General appearance: alert, cooperative and appears stated age Head: Normocephalic, without obvious abnormality, atraumatic Neck: no adenopathy, supple, symmetrical, trachea midline and thyroid normal to inspection and palpation Lungs: clear to auscultation bilaterally Cardiovascular: regular rate and rhythm Breasts: normal appearance, no masses or tenderness Abdomen: soft, non-tender; non distended,  no masses,  no organomegaly Extremities: extremities normal, atraumatic, no cyanosis or edema Skin: Skin color, texture,  turgor normal. No rashes or lesions Lymph nodes: Cervical, supraclavicular, and axillary nodes normal. No abnormal inguinal nodes palpated Neurologic: Grossly normal   Pelvic: External genitalia:  no lesions              Urethra:  normal appearing urethra with no masses, tenderness or lesions              Bartholins and Skenes: normal                 Vagina: normal appearing vagina with normal color and discharge, no lesions              Cervix: no lesions                Bimanual Exam:  Uterus:  normal size, contour, position, consistency, mobility, non-tender              Adnexa: no mass, fullness, tenderness               Rectovaginal: Confirms               Anus:  normal sphincter tone, no lesions  Chaperone was present for exam.  A:  Well Woman with normal exam  On OCP's doing well  FH of diabetes  BMI 31  P:   No pap this year  Discussed breast self exam  Discussed calcium and vit D intake  Screening labs, HgbA1C, TSH  Continue OCP's

## 2018-10-10 ENCOUNTER — Ambulatory Visit: Payer: BC Managed Care – PPO | Admitting: Obstetrics and Gynecology

## 2018-10-10 ENCOUNTER — Other Ambulatory Visit: Payer: Self-pay

## 2018-10-10 ENCOUNTER — Encounter: Payer: Self-pay | Admitting: Obstetrics and Gynecology

## 2018-10-10 VITALS — BP 120/68 | HR 60 | Ht 65.0 in | Wt 189.6 lb

## 2018-10-10 DIAGNOSIS — Z833 Family history of diabetes mellitus: Secondary | ICD-10-CM

## 2018-10-10 DIAGNOSIS — Z3041 Encounter for surveillance of contraceptive pills: Secondary | ICD-10-CM

## 2018-10-10 DIAGNOSIS — Z01419 Encounter for gynecological examination (general) (routine) without abnormal findings: Secondary | ICD-10-CM | POA: Diagnosis not present

## 2018-10-10 DIAGNOSIS — Z0000 Encounter for general adult medical examination without abnormal findings: Secondary | ICD-10-CM | POA: Diagnosis not present

## 2018-10-10 DIAGNOSIS — Z6831 Body mass index (BMI) 31.0-31.9, adult: Secondary | ICD-10-CM

## 2018-10-10 MED ORDER — BLISOVI 24 FE 1-20 MG-MCG(24) PO TABS: 1.0000 | ORAL_TABLET | Freq: Every day | ORAL | 3 refills | Status: DC

## 2018-10-10 NOTE — Patient Instructions (Signed)
EXERCISE AND DIET:  We recommended that you start or continue a regular exercise program for good health. Regular exercise means any activity that makes your heart beat faster and makes you sweat.  We recommend exercising at least 30 minutes per day at least 3 days a week, preferably 4 or 5.  We also recommend a diet low in fat and sugar.  Inactivity, poor dietary choices and obesity can cause diabetes, heart attack, stroke, and kidney damage, among others.    ALCOHOL AND SMOKING:  Women should limit their alcohol intake to no more than 7 drinks/beers/glasses of wine (combined, not each!) per week. Moderation of alcohol intake to this level decreases your risk of breast cancer and liver damage. And of course, no recreational drugs are part of a healthy lifestyle.  And absolutely no smoking or even second hand smoke. Most people know smoking can cause heart and lung diseases, but did you know it also contributes to weakening of your bones? Aging of your skin?  Yellowing of your teeth and nails?  CALCIUM AND VITAMIN D:  Adequate intake of calcium and Vitamin D are recommended.  The recommendations for exact amounts of these supplements seem to change often, but generally speaking 1,000 mg of calcium (between diet and supplement) and 800 units of Vitamin D per day seems prudent. Certain women may benefit from higher intake of Vitamin D.  If you are among these women, your doctor will have told you during your visit.    PAP SMEARS:  Pap smears, to check for cervical cancer or precancers,  have traditionally been done yearly, although recent scientific advances have shown that most women can have pap smears less often.  However, every woman still should have a physical exam from her gynecologist every year. It will include a breast check, inspection of the vulva and vagina to check for abnormal growths or skin changes, a visual exam of the cervix, and then an exam to evaluate the size and shape of the uterus and  ovaries.  And after 28 years of age, a rectal exam is indicated to check for rectal cancers. We will also provide age appropriate advice regarding health maintenance, like when you should have certain vaccines, screening for sexually transmitted diseases, bone density testing, colonoscopy, mammograms, etc.   MAMMOGRAMS:  All women over 40 years old should have a yearly mammogram. Many facilities now offer a "3D" mammogram, which may cost around $50 extra out of pocket. If possible,  we recommend you accept the option to have the 3D mammogram performed.  It both reduces the number of women who will be called back for extra views which then turn out to be normal, and it is better than the routine mammogram at detecting truly abnormal areas.    COLON CANCER SCREENING: Now recommend starting at age 45. At this time colonoscopy is not covered for routine screening until 50. There are take home tests that can be done between 45-49.   COLONOSCOPY:  Colonoscopy to screen for colon cancer is recommended for all women at age 50.  We know, you hate the idea of the prep.  We agree, BUT, having colon cancer and not knowing it is worse!!  Colon cancer so often starts as a polyp that can be seen and removed at colonscopy, which can quite literally save your life!  And if your first colonoscopy is normal and you have no family history of colon cancer, most women don't have to have it again for   10 years.  Once every ten years, you can do something that may end up saving your life, right?  We will be happy to help you get it scheduled when you are ready.  Be sure to check your insurance coverage so you understand how much it will cost.  It may be covered as a preventative service at no cost, but you should check your particular policy.      Breast Self-Awareness Breast self-awareness means being familiar with how your breasts look and feel. It involves checking your breasts regularly and reporting any changes to your  health care provider. Practicing breast self-awareness is important. A change in your breasts can be a sign of a serious medical problem. Being familiar with how your breasts look and feel allows you to find any problems early, when treatment is more likely to be successful. All women should practice breast self-awareness, including women who have had breast implants. How to do a breast self-exam One way to learn what is normal for your breasts and whether your breasts are changing is to do a breast self-exam. To do a breast self-exam: Look for Changes  1. Remove all the clothing above your waist. 2. Stand in front of a mirror in a room with good lighting. 3. Put your hands on your hips. 4. Push your hands firmly downward. 5. Compare your breasts in the mirror. Look for differences between them (asymmetry), such as: ? Differences in shape. ? Differences in size. ? Puckers, dips, and bumps in one breast and not the other. 6. Look at each breast for changes in your skin, such as: ? Redness. ? Scaly areas. 7. Look for changes in your nipples, such as: ? Discharge. ? Bleeding. ? Dimpling. ? Redness. ? A change in position. Feel for Changes Carefully feel your breasts for lumps and changes. It is best to do this while lying on your back on the floor and again while sitting or standing in the shower or tub with soapy water on your skin. Feel each breast in the following way:  Place the arm on the side of the breast you are examining above your head.  Feel your breast with the other hand.  Start in the nipple area and make  inch (2 cm) overlapping circles to feel your breast. Use the pads of your three middle fingers to do this. Apply light pressure, then medium pressure, then firm pressure. The light pressure will allow you to feel the tissue closest to the skin. The medium pressure will allow you to feel the tissue that is a little deeper. The firm pressure will allow you to feel the tissue  close to the ribs.  Continue the overlapping circles, moving downward over the breast until you feel your ribs below your breast.  Move one finger-width toward the center of the body. Continue to use the  inch (2 cm) overlapping circles to feel your breast as you move slowly up toward your collarbone.  Continue the up and down exam using all three pressures until you reach your armpit.  Write Down What You Find  Write down what is normal for each breast and any changes that you find. Keep a written record with breast changes or normal findings for each breast. By writing this information down, you do not need to depend only on memory for size, tenderness, or location. Write down where you are in your menstrual cycle, if you are still menstruating. If you are having trouble noticing differences   in your breasts, do not get discouraged. With time you will become more familiar with the variations in your breasts and more comfortable with the exam. How often should I examine my breasts? Examine your breasts every month. If you are breastfeeding, the best time to examine your breasts is after a feeding or after using a breast pump. If you menstruate, the best time to examine your breasts is 5-7 days after your period is over. During your period, your breasts are lumpier, and it may be more difficult to notice changes. When should I see my health care provider? See your health care provider if you notice:  A change in shape or size of your breasts or nipples.  A change in the skin of your breast or nipples, such as a reddened or scaly area.  Unusual discharge from your nipples.  A lump or thick area that was not there before.  Pain in your breasts.  Anything that concerns you.  

## 2018-10-11 LAB — CBC
HEMOGLOBIN: 12.8 g/dL (ref 11.1–15.9)
Hematocrit: 39.2 % (ref 34.0–46.6)
MCH: 29.6 pg (ref 26.6–33.0)
MCHC: 32.7 g/dL (ref 31.5–35.7)
MCV: 91 fL (ref 79–97)
PLATELETS: 431 10*3/uL (ref 150–450)
RBC: 4.33 x10E6/uL (ref 3.77–5.28)
RDW: 11.6 % — ABNORMAL LOW (ref 11.7–15.4)
WBC: 6.5 10*3/uL (ref 3.4–10.8)

## 2018-10-11 LAB — COMPREHENSIVE METABOLIC PANEL
ALBUMIN: 4 g/dL (ref 3.9–5.0)
ALT: 7 IU/L (ref 0–32)
AST: 12 IU/L (ref 0–40)
Albumin/Globulin Ratio: 1 — ABNORMAL LOW (ref 1.2–2.2)
Alkaline Phosphatase: 46 IU/L (ref 39–117)
BUN/Creatinine Ratio: 12 (ref 9–23)
BUN: 9 mg/dL (ref 6–20)
CALCIUM: 9.3 mg/dL (ref 8.7–10.2)
CHLORIDE: 101 mmol/L (ref 96–106)
CO2: 24 mmol/L (ref 20–29)
CREATININE: 0.73 mg/dL (ref 0.57–1.00)
GFR, EST AFRICAN AMERICAN: 130 mL/min/{1.73_m2} (ref >59)
GFR, EST NON AFRICAN AMERICAN: 112 mL/min/{1.73_m2} (ref >59)
GLUCOSE: 84 mg/dL (ref 65–99)
Globulin, Total: 4.1 g/dL (ref 1.5–4.5)
Potassium: 4.8 mmol/L (ref 3.5–5.2)
Sodium: 139 mmol/L (ref 134–144)
TOTAL PROTEIN: 8.1 g/dL (ref 6.0–8.5)

## 2018-10-11 LAB — TSH: TSH: 1.46 u[IU]/mL (ref 0.450–4.500)

## 2018-10-11 LAB — LIPID PANEL
Chol/HDL Ratio: 3.1 ratio (ref 0.0–4.4)
Cholesterol, Total: 154 mg/dL (ref 100–199)
HDL: 49 mg/dL (ref >39)
LDL Calculated: 88 mg/dL (ref 0–99)
Triglycerides: 84 mg/dL (ref 0–149)
VLDL Cholesterol Cal: 17 mg/dL (ref 5–40)

## 2018-10-11 LAB — HEMOGLOBIN A1C
Est. average glucose Bld gHb Est-mCnc: 103 mg/dL
Hgb A1c MFr Bld: 5.2 % (ref 4.8–5.6)

## 2019-03-08 ENCOUNTER — Other Ambulatory Visit: Payer: Self-pay | Admitting: Radiology

## 2019-03-08 DIAGNOSIS — Z20822 Contact with and (suspected) exposure to covid-19: Secondary | ICD-10-CM

## 2019-03-09 LAB — NOVEL CORONAVIRUS, NAA: SARS-CoV-2, NAA: NOT DETECTED

## 2019-06-26 ENCOUNTER — Other Ambulatory Visit: Payer: Self-pay

## 2019-09-01 ENCOUNTER — Other Ambulatory Visit: Payer: Self-pay | Admitting: Obstetrics and Gynecology

## 2019-09-01 NOTE — Telephone Encounter (Signed)
Medication refill request:Blisovi  Last AEX:  10/10/18 Next AEX: 10/13/19 Last MMG (if hormonal medication request): NA Refill authorized: #[id number] with 1 RF to get her to her annual.

## 2019-09-10 ENCOUNTER — Telehealth: Payer: Self-pay | Admitting: Obstetrics and Gynecology

## 2019-09-10 NOTE — Telephone Encounter (Signed)
Left message regarding upcoming appointment has been canceled and needs to be rescheduled. °

## 2019-09-18 ENCOUNTER — Emergency Department: Payer: BC Managed Care – PPO

## 2019-09-18 ENCOUNTER — Encounter: Payer: Self-pay | Admitting: Radiology

## 2019-09-18 ENCOUNTER — Other Ambulatory Visit: Payer: Self-pay

## 2019-09-18 ENCOUNTER — Observation Stay
Admission: EM | Admit: 2019-09-18 | Discharge: 2019-09-19 | Disposition: A | Discharge status: Home / Self Care | Payer: BC Managed Care – PPO | Attending: Internal Medicine | Admitting: Internal Medicine

## 2019-09-18 DIAGNOSIS — I2609 Other pulmonary embolism with acute cor pulmonale: Secondary | ICD-10-CM

## 2019-09-18 DIAGNOSIS — Z20822 Contact with and (suspected) exposure to covid-19: Secondary | ICD-10-CM | POA: Diagnosis not present

## 2019-09-18 DIAGNOSIS — G809 Cerebral palsy, unspecified: Secondary | ICD-10-CM | POA: Diagnosis not present

## 2019-09-18 DIAGNOSIS — Z79899 Other long term (current) drug therapy: Secondary | ICD-10-CM | POA: Diagnosis not present

## 2019-09-18 DIAGNOSIS — I2699 Other pulmonary embolism without acute cor pulmonale: Principal | ICD-10-CM | POA: Insufficient documentation

## 2019-09-18 DIAGNOSIS — S50811A Abrasion of right forearm, initial encounter: Secondary | ICD-10-CM | POA: Insufficient documentation

## 2019-09-18 DIAGNOSIS — N390 Urinary tract infection, site not specified: Secondary | ICD-10-CM | POA: Insufficient documentation

## 2019-09-18 DIAGNOSIS — Z888 Allergy status to other drugs, medicaments and biological substances status: Secondary | ICD-10-CM | POA: Diagnosis not present

## 2019-09-18 DIAGNOSIS — W19XXXA Unspecified fall, initial encounter: Secondary | ICD-10-CM | POA: Insufficient documentation

## 2019-09-18 DIAGNOSIS — Z23 Encounter for immunization: Secondary | ICD-10-CM | POA: Insufficient documentation

## 2019-09-18 DIAGNOSIS — I69351 Hemiplegia and hemiparesis following cerebral infarction affecting right dominant side: Secondary | ICD-10-CM | POA: Diagnosis not present

## 2019-09-18 DIAGNOSIS — Z793 Long term (current) use of hormonal contraceptives: Secondary | ICD-10-CM | POA: Diagnosis not present

## 2019-09-18 DIAGNOSIS — J90 Pleural effusion, not elsewhere classified: Secondary | ICD-10-CM | POA: Diagnosis not present

## 2019-09-18 DIAGNOSIS — I6310 Cerebral infarction due to embolism of unspecified precerebral artery: Secondary | ICD-10-CM | POA: Diagnosis present

## 2019-09-18 DIAGNOSIS — S0990XA Unspecified injury of head, initial encounter: Secondary | ICD-10-CM | POA: Diagnosis not present

## 2019-09-18 DIAGNOSIS — Z7901 Long term (current) use of anticoagulants: Secondary | ICD-10-CM | POA: Diagnosis present

## 2019-09-18 LAB — URINALYSIS, COMPLETE (UACMP) WITH MICROSCOPIC
Bilirubin Urine: NEGATIVE
Glucose, UA: NEGATIVE mg/dL
Ketones, ur: NEGATIVE mg/dL
Nitrite: NEGATIVE
Protein, ur: NEGATIVE mg/dL
Specific Gravity, Urine: 1.005 (ref 1.005–1.030)
pH: 7 (ref 5.0–8.0)

## 2019-09-18 LAB — CBC WITH DIFFERENTIAL/PLATELET
Abs Immature Granulocytes: 0.04 10*3/uL (ref 0.00–0.07)
Basophils Absolute: 0 10*3/uL (ref 0.0–0.1)
Basophils Relative: 0 %
Eosinophils Absolute: 0.1 10*3/uL (ref 0.0–0.5)
Eosinophils Relative: 1 %
HCT: 41.2 % (ref 36.0–46.0)
Hemoglobin: 13.4 g/dL (ref 12.0–15.0)
Immature Granulocytes: 0 %
Lymphocytes Relative: 22 %
Lymphs Abs: 2.2 10*3/uL (ref 0.7–4.0)
MCH: 29.9 pg (ref 26.0–34.0)
MCHC: 32.5 g/dL (ref 30.0–36.0)
MCV: 92 fL (ref 80.0–100.0)
Monocytes Absolute: 1 10*3/uL (ref 0.1–1.0)
Monocytes Relative: 10 %
Neutro Abs: 6.8 10*3/uL (ref 1.7–7.7)
Neutrophils Relative %: 67 %
Platelets: 410 10*3/uL — ABNORMAL HIGH (ref 150–400)
RBC: 4.48 MIL/uL (ref 3.87–5.11)
RDW: 11.4 % — ABNORMAL LOW (ref 11.5–15.5)
WBC: 10.3 10*3/uL (ref 4.0–10.5)
nRBC: 0 % (ref 0.0–0.2)

## 2019-09-18 LAB — HEPARIN ANTI-XA: Heparin LMW: <0.1 IU/mL

## 2019-09-18 LAB — BRAIN NATRIURETIC PEPTIDE: B Natriuretic Peptide: 22 pg/mL (ref 0.0–100.0)

## 2019-09-18 LAB — SARS CORONAVIRUS 2 (TAT 6-24 HRS): SARS Coronavirus 2: NEGATIVE

## 2019-09-18 LAB — BASIC METABOLIC PANEL
Anion gap: 10 (ref 5–15)
BUN: 9 mg/dL (ref 6–20)
CO2: 24 mmol/L (ref 22–32)
Calcium: 8.8 mg/dL — ABNORMAL LOW (ref 8.9–10.3)
Chloride: 105 mmol/L (ref 98–111)
Creatinine, Ser: 0.72 mg/dL (ref 0.44–1.00)
GFR calc Af Amer: >60 mL/min (ref >60)
GFR calc non Af Amer: >60 mL/min (ref >60)
Glucose, Bld: 86 mg/dL (ref 70–99)
Potassium: 4.2 mmol/L (ref 3.5–5.1)
Sodium: 139 mmol/L (ref 135–145)

## 2019-09-18 LAB — TSH: TSH: 1.649 u[IU]/mL (ref 0.350–4.500)

## 2019-09-18 LAB — HEPARIN LEVEL (UNFRACTIONATED): Heparin Unfractionated: 0.29 IU/mL — ABNORMAL LOW (ref 0.30–0.70)

## 2019-09-18 LAB — APTT: aPTT: 25 seconds (ref 24–36)

## 2019-09-18 LAB — TROPONIN I (HIGH SENSITIVITY)
Troponin I (High Sensitivity): <2 ng/L (ref <18)
Troponin I (High Sensitivity): <2 ng/L (ref <18)

## 2019-09-18 LAB — POCT PREGNANCY, URINE: Preg Test, Ur: NEGATIVE

## 2019-09-18 LAB — HIV ANTIBODY (ROUTINE TESTING W REFLEX): HIV Screen 4th Generation wRfx: NONREACTIVE

## 2019-09-18 LAB — PROTIME-INR
INR: 1.1 (ref 0.8–1.2)
Prothrombin Time: 14 seconds (ref 11.4–15.2)

## 2019-09-18 LAB — HCG, QUANTITATIVE, PREGNANCY: hCG, Beta Chain, Quant, S: <1 m[IU]/mL (ref <5)

## 2019-09-18 MED ORDER — DM-GUAIFENESIN ER 30-600 MG PO TB12
1.0000 | ORAL_TABLET | Freq: Two times a day (BID) | ORAL | Status: DC
  Administered 2019-09-18 – 2019-09-19 (×3): 1 via ORAL
  Filled 2019-09-18 (×3): qty 1

## 2019-09-18 MED ORDER — HEPARIN BOLUS VIA INFUSION
1000.0000 [IU] | Freq: Once | INTRAVENOUS | Status: AC
  Administered 2019-09-18: 1000 [IU] via INTRAVENOUS
  Filled 2019-09-18: qty 1000

## 2019-09-18 MED ORDER — ACETAMINOPHEN 325 MG PO TABS: 650.0000 mg | ORAL_TABLET | Freq: Four times a day (QID) | ORAL | Status: DC | PRN

## 2019-09-18 MED ORDER — ACETAMINOPHEN 650 MG RE SUPP: 650.0000 mg | Freq: Four times a day (QID) | RECTAL | Status: DC | PRN

## 2019-09-18 MED ORDER — MORPHINE SULFATE (PF) 2 MG/ML IV SOLN: 1.0000 mg | INTRAVENOUS | Status: DC | PRN

## 2019-09-18 MED ORDER — IOHEXOL 350 MG/ML SOLN
75.0000 mL | Freq: Once | INTRAVENOUS | Status: AC | PRN
  Administered 2019-09-18: 11:00:00 75 mL via INTRAVENOUS

## 2019-09-18 MED ORDER — HEPARIN (PORCINE) 25000 UT/250ML-% IV SOLN
1350.0000 [IU]/h | INTRAVENOUS | Status: DC
  Administered 2019-09-18: 13:00:00 1200 [IU]/h via INTRAVENOUS
  Administered 2019-09-19: 06:00:00 1350 [IU]/h via INTRAVENOUS
  Filled 2019-09-18 (×2): qty 250

## 2019-09-18 MED ORDER — ALBUTEROL SULFATE (2.5 MG/3ML) 0.083% IN NEBU: 2.5000 mg | INHALATION_SOLUTION | RESPIRATORY_TRACT | Status: DC | PRN

## 2019-09-18 MED ORDER — HEPARIN BOLUS VIA INFUSION
3800.0000 [IU] | Freq: Once | INTRAVENOUS | Status: AC
  Administered 2019-09-18: 13:00:00 3800 [IU] via INTRAVENOUS
  Filled 2019-09-18: qty 3800

## 2019-09-18 MED ORDER — SODIUM CHLORIDE 0.9 % IV BOLUS
1000.0000 mL | Freq: Once | INTRAVENOUS | Status: AC
  Administered 2019-09-18: 10:00:00 1000 mL via INTRAVENOUS

## 2019-09-18 MED ORDER — OXYCODONE-ACETAMINOPHEN 5-325 MG PO TABS
1.0000 | ORAL_TABLET | ORAL | Status: DC | PRN
  Administered 2019-09-18 (×2): 1 via ORAL
  Filled 2019-09-18 (×2): qty 1

## 2019-09-18 MED ORDER — SODIUM CHLORIDE 0.9 % IV SOLN
2.0000 g | INTRAVENOUS | Status: DC
  Administered 2019-09-18: 2 g via INTRAVENOUS
  Filled 2019-09-18: qty 2
  Filled 2019-09-18: qty 20

## 2019-09-18 MED ORDER — TETANUS-DIPHTH-ACELL PERTUSSIS 5-2.5-18.5 LF-MCG/0.5 IM SUSP
0.5000 mL | Freq: Once | INTRAMUSCULAR | Status: AC
  Administered 2019-09-18: 09:00:00 0.5 mL via INTRAMUSCULAR
  Filled 2019-09-18: qty 0.5

## 2019-09-18 NOTE — ED Provider Notes (Signed)
[facility] Emergency Department Provider Note  ____________________________________________  Time seen: Approximately 9:05 AM  I have reviewed the triage vital signs and the nursing notes.   HISTORY  Chief Complaint Shortness of Breath    HPI Elizabeth Stanton is a 29 y.o. female with a history of cerebral palsy, stroke, right hemiplegia who comes the ED complaining of chest pain and shortness of breath.  She was in her usual state of health until 3 days ago when her left leg buckled causing her to have a mechanical fall.  She reports an abrasion to her right forearm at the time.  However, yesterday she started developing sharp pain in the left upper back, hurts to breathe, associated with shortness of breath.  Worse with movement.  No alleviating factors.  No cough fevers or chills.  No lower extremity weakness or paresthesia.  Denies head trauma or vision change or neck pain.  No blood thinner use.  No recent travel, hospitalization or surgery, no history of DVT or PE.      Past Medical History:  Diagnosis Date  . Cerebral palsy (HCC)   . Dysmenorrhea   . History of abnormal cervical Pap smear    + HPV  . History of cerebral palsy   . 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 Acute pulmonary embolism (HCC) 09/18/2019  . Acute pulmonary embolus (HCC) 09/18/2019  . 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 UTI (urinary tract infection)   . Lumbar pain 10/17/2017  . Bilateral hip pain 10/17/2017  . Right hemiplegia (HCC) 10/17/2017  . History of cerebral palsy      Past Surgical History:  Procedure Laterality Date  . COLPOSCOPY       Prior to Admission medications   Medication Sig Start Date End Date Taking? Authorizing Provider  BLISOVI 24 FE 1-20 MG-MCG(24) tablet TAKE 1 TABLET BY MOUTH EVERY DAY 09/01/19   Romualdo Bolk, MD     Allergies Lavender  oil   Family History  Problem Relation Age of Onset  . Hypertension Mother   . Diabetes Mother   . Hypertension Maternal Grandmother   . Cancer Paternal Grandfather        skin    Social History Social History   Tobacco Use  . Smoking status: Never Smoker  . Smokeless tobacco: Never Used  Substance Use Topics  . Alcohol use: Yes    Alcohol/week: 1.0 standard drinks    Types: 1 Standard drinks or equivalent per week    Comment: wine  . Drug use: No    Review of Systems  Constitutional:   No fever or chills.  ENT:   No sore throat. No rhinorrhea. Cardiovascular:   Positive posterior chest pain as above without syncope. Respiratory:   Positive shortness of breath without cough. Gastrointestinal:   Negative for abdominal pain, vomiting and diarrhea.  Musculoskeletal:   Positive back pain as above without All other systems reviewed and are negative except as documented above in ROS and HPI.  ____________________________________________   PHYSICAL EXAM:  VITAL SIGNS: ED Triage Vitals  Enc Vitals Group     BP 09/18/19 0822 (!) 118/97     Pulse Rate 09/18/19 0822 (!) 123     Resp 09/18/19 0822 20     Temp 09/18/19 0822 98.7 F (37.1 C)     Temp Source 09/18/19 9628  Oral     SpO2 09/18/19 0822 100 %     Weight 09/18/19 0824 200 lb (90.7 kg)     Height 09/18/19 0824 5\' 5"  (1.651 m)     Head Circumference --      Peak Flow --      Pain Score 09/18/19 0824 6     Pain Loc --      Pain Edu? --      Excl. in GC? --     Vital signs reviewed, nursing assessments reviewed.   Constitutional:   Alert and oriented. Non-toxic appearance. Eyes:   Conjunctivae are normal. EOMI. PERRL. ENT      Head:   Normocephalic and atraumatic.      Nose:   Wearing a mask.      Mouth/Throat:   Wearing a mask.      Neck:   No meningismus. Full ROM. Hematological/Lymphatic/Immunilogical:   No cervical lymphadenopathy. Cardiovascular:   Tachycardia heart rate 120. Symmetric bilateral  radial and DP pulses.  No murmurs. Cap refill less than 2 seconds. Respiratory:   Normal respiratory effort without tachypnea/retractions. Breath sounds are clear and equal bilaterally. No wheezes/rales/rhonchi. Gastrointestinal:   Soft and nontender. Non distended. There is no CVA tenderness.  No rebound, rigidity, or guarding.  Musculoskeletal:   Normal range of motion in all extremities. No joint effusions.  No lower extremity tenderness.  Left leg calf circumference greater than right, chronic per patient owing to right leg atrophy from hemiplegia.  No edema.  Tenderness in the left upper back rhomboid and infrascapular musculature which reproduces pain.  No midline spinal tenderness Neurologic:   Normal speech and language.  Motor grossly intact. No acute focal neurologic deficits are appreciated.  Skin:    Skin is warm, dry and intact. No rash noted.  No petechiae, purpura, or bullae.  ____________________________________________    LABS (pertinent positives/negatives) (all labs ordered are listed, but only abnormal results are displayed) Labs Reviewed  BASIC METABOLIC PANEL - Abnormal; Notable for the following components:      Result Value   Calcium 8.8 (*)    All other components within normal limits  CBC WITH DIFFERENTIAL/PLATELET - Abnormal; Notable for the following components:   RDW 11.4 (*)    Platelets 410 (*)    All other components within normal limits  URINALYSIS, COMPLETE (UACMP) WITH MICROSCOPIC - Abnormal; Notable for the following components:   Color, Urine YELLOW (*)    APPearance HAZY (*)    Hgb urine dipstick SMALL (*)    Leukocytes,Ua LARGE (*)    Bacteria, UA MANY (*)    All other components within normal limits  SARS CORONAVIRUS 2 (TAT 6-24 HRS)  TSH  HCG, QUANTITATIVE, PREGNANCY  APTT  PROTIME-INR  HEPARIN ANTI-XA  HEPARIN LEVEL (UNFRACTIONATED)  POC URINE PREG, ED  POCT PREGNANCY, URINE  TROPONIN I (HIGH SENSITIVITY)  TROPONIN I (HIGH  SENSITIVITY)   ____________________________________________   EKG  Interpreted by me Sinus tachycardia rate 117.  Right axis, normal intervals.  Normal QRS and ST segments.  Diffuse T wave inversions in inferior and anterolateral leads.  There is an S1Q3T3 pattern.  ____________________________________________    RADIOLOGY  CT Angio Chest PE W and/or Wo Contrast  Result Date: 09/18/2019 CLINICAL DATA:  09/20/2019 back pain and shortness of breath since last night. No fever or cough. EXAM: CT ANGIOGRAPHY CHEST WITH CONTRAST TECHNIQUE: Multidetector CT imaging of the chest was performed using the standard protocol during bolus administration of  intravenous contrast. Multiplanar CT image reconstructions and MIPs were obtained to evaluate the vascular anatomy. CONTRAST:  57mL OMNIPAQUE IOHEXOL 350 MG/ML SOLN COMPARISON:  None. FINDINGS: Cardiovascular: Satisfactory opacification of the pulmonary arteries to the segmental level. Suspected subsegmental pulmonary embolism in the peripheral lateral basal segment of the left lower lobe (series 9, images 47). Normal heart size. No pericardial effusion. No thoracic aortic aneurysm or dissection. Mediastinum/Nodes: No enlarged mediastinal, hilar, or axillary lymph nodes. Thyroid gland, trachea, and esophagus demonstrate no significant findings. Lungs/Pleura: Trace left pleural effusion. Subsegmental atelectasis in the lingula and left greater than right lower lobes. No consolidation or pneumothorax. 6 mm subpleural nodule in the right lower lobe (series 6, image 41). Upper Abdomen: No acute abnormality. Musculoskeletal: No chest wall abnormality. No acute or significant osseous findings. Review of the MIP images confirms the above findings. IMPRESSION: 1. Suspected subsegmental pulmonary embolism in the peripheral lateral basal segment of the left lower lobe. 2. Trace left pleural effusion. 3. 6 mm subpleural nodule in the right lower lobe, almost certainly benign.  Please note that Fleischner criteria do not apply to patients younger than 35. Electronically Signed   By: Obie Dredge M.D.   On: 09/18/2019 11:31    ____________________________________________   PROCEDURES .Critical Care Performed by: Sharman Cheek, MD Authorized by: Sharman Cheek, MD   Critical care provider statement:    Critical care time (minutes):  33   Critical care time was exclusive of:  Separately billable procedures and treating other patients   Critical care was necessary to treat or prevent imminent or life-threatening deterioration of the following conditions:  Circulatory failure and respiratory failure   Critical care was time spent personally by me on the following activities:  Development of treatment plan with patient or surrogate, discussions with consultants, evaluation of patient's response to treatment, examination of patient, obtaining history from patient or surrogate, ordering and performing treatments and interventions, ordering and review of laboratory studies, ordering and review of radiographic studies, pulse oximetry, re-evaluation of patient's condition and review of old charts    ____________________________________________  DIFFERENTIAL DIAGNOSIS   Musculoskeletal pain, dehydration, small pneumothorax, pulmonary embolism, hyperthyroidism, electrolyte abnormality  CLINICAL IMPRESSION / ASSESSMENT AND PLAN / ED COURSE  Medications ordered in the ED: Medications  heparin bolus via infusion 3,800 Units (has no administration in time range)  heparin ADULT infusion 100 units/mL (25000 units/230mL sodium chloride 0.45%) (has no administration in time range)  oxyCODONE-acetaminophen (PERCOCET/ROXICET) 5-325 MG per tablet 1 tablet (has no administration in time range)  morphine 2 MG/ML injection 1 mg (has no administration in time range)  albuterol (PROVENTIL) (2.5 MG/3ML) 0.083% nebulizer solution 2.5 mg (has no administration in time range)   dextromethorphan-guaiFENesin (MUCINEX DM) 30-600 MG per 12 hr tablet 1 tablet (has no administration in time range)  sodium chloride 0.9 % bolus 1,000 mL (0 mLs Intravenous Stopping Infusion hung by another clincian 09/18/19 1143)  Tdap (BOOSTRIX) injection 0.5 mL (0.5 mLs Intramuscular Given 09/18/19 0921)  iohexol (OMNIPAQUE) 350 MG/ML injection 75 mL (75 mLs Intravenous Contrast Given 09/18/19 1053)    Pertinent labs & imaging results that were available during my care of the patient were reviewed by me and considered in my medical decision making (see chart for details).  Elizabeth Stanton was evaluated in Emergency Department on 09/18/2019 for the symptoms described in the history of present illness. She was evaluated in the context of the global COVID-19 pandemic, which necessitated consideration that the patient might be at  risk for infection with the SARS-CoV-2 virus that causes COVID-19. Institutional protocols and algorithms that pertain to the evaluation of patients at risk for COVID-19 are in a state of rapid change based on information released by regulatory bodies including the CDC and federal and state organizations. These policies and algorithms were followed during the patient's care in the ED.   Patient presents with pleuritic chest pain and shortness of breath after a fall, abnormal EKG which is suggestive of right heart strain.  With tachycardia, will need to obtain CT angiogram of the chest to rule out PE, obtain labs, give IV fluids for hydration.  ----------------------------------------- 12:37 PM on 09/18/2019 -----------------------------------------  CT scan positive for left lower lung PE matching the patient's symptoms.  Still tachycardic and tachypneic.  Still has persistent pain.  Plan to admit, start heparin drip.  Urinalysis suggestive of UTI but patient has no symptoms of cystitis.  I will add on a urine culture for now and defer antibiotics.       ____________________________________________   FINAL CLINICAL IMPRESSION(S) / ED DIAGNOSES    Final diagnoses:  Acute pulmonary embolism without acute cor pulmonale, unspecified pulmonary embolism type (HCC)  Cerebral palsy, unspecified type [facility])     ED Discharge Orders    None      Portions of this note were generated with dragon dictation software. Dictation errors may occur despite best attempts at proofreading.   Carrie Mew, MD 09/18/19 [phone]

## 2019-09-18 NOTE — Consult Note (Signed)
ANTICOAGULATION CONSULT NOTE  Pharmacy Consult for Heparin Indication: pulmonary embolus  Allergies  Allergen Reactions  . Lavender Oil Hives    Patient Measurements: Height: 5\' 5"  (165.1 cm) Weight: 200 lb (90.7 kg) IBW/kg (Calculated) : 57 Heparin Dosing Weight: 77.1 kg  Vital Signs: Temp: 98.7 F (37.1 C) (02/25 0822) Temp Source: Oral (02/25 0822) BP: 140/96 (02/25 1006) Pulse Rate: 106 (02/25 1006)  Labs: Recent Labs    09/18/19 0909  HGB 13.4  HCT 41.2  PLT 410*  CREATININE 0.72  TROPONINIHS <2    Estimated Creatinine Clearance: 115.5 mL/min (by C-G formula based on SCr of 0.72 mg/dL).   Medical History: Past Medical History:  Diagnosis Date  . Cerebral palsy (HCC)   . Dysmenorrhea   . History of abnormal cervical Pap smear    + HPV  . History of cerebral palsy   . 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 No DOAC PTA.   Goal of Therapy:  Heparin level 0.3-0.7 units/ml Monitor platelets by anticoagulation protocol: Yes   Plan:  Give 3800 units bolus x 1 Start heparin infusion at 1200 units/hr Check anti-Xa level in 6 hours and daily while on heparin Continue to monitor H&H and platelets  09/20/19, PharmD, BCPS 09/18/2019,12:22 PM

## 2019-09-18 NOTE — H&P (Addendum)
History and Physical    Elizabeth Stanton HYQ:[id number] DOB: 05/21/1991 DOA: 09/18/2019  Referring MD/NP/PA:   PCP: Horald Pollen, MD   Patient coming from:  The patient is coming from home.  At baseline, pt is independent for most of ADL.        Chief Complaint: SOB  HPI: Elizabeth Stanton is a 29 y.o. female with medical history significant of cerebral palsy, stroke, right arm weakkness, who presents with SOB.  Pt states that she developed SOB since yesterday afternoon, which has been progressively worsening.  She also has left-sided chest pain and left upper back pain.  No cough, fever or chills.  No tenderness in the calf areas.  Patient states that she has increased urinary frequency, no dysuria or burning on urination.  No nausea, vomiting, diarrhea, abdominal pain. Of note, she states that she had mechanic fall when she had her left leg buckled on Saturday. She reports an abrasion to her right forearm at the time. No loss of consciousness. Denies head trauma or vision change or neck pain.    ED Course: pt was found to have positive urinalysis (hazy appearance, large amount of leukocyte, many bacteria, WBC 21-50), negative troponin, negative pregnancy test, pending COVID-19 PCR, WBC 10.3, electrolytes renal function okay, temperature normal, blood pressure 140/96, tachycardia, tachypnea, oxygen saturation 99% on room air.   CTA of chest: 1. Suspected subsegmental pulmonary embolism in the peripheral lateral basal segment of the left lower lobe. 2. Trace left pleural effusion. 3. 6 mm subpleural nodule in the right lower lobe, almost certainly benign. Please note that Fleischner criteria do not apply to patients younger than 35.  Review of Systems:   General: no fevers, chills, no body weight gain, has fatigue HEENT: no blurry vision, hearing changes or sore throat Respiratory: has dyspnea, no coughing, wheezing CV: has chest pain, no palpitations GI: no nausea, vomiting,  abdominal pain, diarrhea, constipation GU: no dysuria, burning on urination, increased urinary frequency, hematuria  Ext: no leg edema Neuro: has right arm weakness, no vision change or hearing loss. Had fall. Skin: no rash, no skin tear. MSK: No muscle spasm, no deformity, no limitation of range of movement in spin Heme: No easy bruising.  Travel history: No recent long distant travel.  Allergy:  Allergies  Allergen Reactions  . Lavender Oil Hives    Past Medical History:  Diagnosis Date  . Cerebral palsy ([place])   . Dysmenorrhea   . History of abnormal cervical Pap smear    + HPV  . History of cerebral palsy   . 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 COLPOSCOPY      Social History:  reports that she has never smoked. She has never used smokeless tobacco. She reports current alcohol use of about 1.0 standard drinks of alcohol per week. She reports that she does not use drugs.  Family History:  Family History  Problem Relation Age of Onset  . Hypertension Mother   . Diabetes Mother   . Hypertension Maternal Grandmother   . Cancer Paternal Grandfather        skin     Prior to Admission medications   Medication Sig Start Date End Date Taking? Authorizing Provider  BLISOVI 24 FE 1-20 MG-MCG(24) tablet TAKE 1 TABLET BY MOUTH EVERY DAY 09/01/19   Salvadore Dom, MD    Physical Exam: Vitals:   09/18/19 0900 09/18/19 1006  09/18/19 1330 09/18/19 1412  BP: (!) 144/94 (!) 140/96 (!) 142/98 (!) 139/97  Pulse: (!) 108 (!) 106 (!) 109 (!) 103  Resp: (!) 29 (!) 25 (!) 26   Temp:    98.6 F (37 C)  TempSrc:    Oral  SpO2: 100% 100% 99% 99%  Weight:      Height:       General: Not in acute distress HEENT:       Eyes: PERRL, EOMI, no scleral icterus.       ENT: No discharge from the ears and nose, no pharynx injection, no tonsillar enlargement.        Neck: No JVD, no bruit, no mass felt. Heme: No  neck lymph node enlargement. Cardiac: S1/S2, RRR, No murmurs, No gallops or rubs. Respiratory:  No rales, wheezing, rhonchi or rubs. GI: Soft, nondistended, nontender, no rebound pain, no organomegaly, BS present. GU: No hematuria Ext: No pitting leg edema bilaterally. 2+DP/PT pulse bilaterally. Musculoskeletal: No joint deformities, No joint redness or warmth, no limitation of ROM in spin. Skin: No rashes.  Neuro: Alert, oriented X3, cranial nerves II-XII grossly intact, has right arm weakness.  Psych: Patient is not psychotic, no suicidal or hemocidal ideation.  Labs on Admission: I have personally reviewed following labs and imaging studies  CBC: Recent Labs  Lab 09/18/19 0909  WBC 10.3  NEUTROABS 6.8  HGB 13.4  HCT 41.2  MCV 92.0  PLT 410*   Basic Metabolic Panel: Recent Labs  Lab 09/18/19 0909  NA 139  K 4.2  CL 105  CO2 24  GLUCOSE 86  BUN 9  CREATININE 0.72  CALCIUM 8.8*   GFR: Estimated Creatinine Clearance: 115.5 mL/min (by C-G formula based on SCr of 0.72 mg/dL). Liver Function Tests: No results for input(s): AST, ALT, ALKPHOS, BILITOT, PROT, ALBUMIN in the last 168 hours. No results for input(s): LIPASE, AMYLASE in the last 168 hours. No results for input(s): AMMONIA in the last 168 hours. Coagulation Profile: Recent Labs  Lab 09/18/19 1301  INR 1.1   Cardiac Enzymes: No results for input(s): CKTOTAL, CKMB, CKMBINDEX, TROPONINI in the last 168 hours. BNP (last 3 results) No results for input(s): PROBNP in the last 8760 hours. HbA1C: No results for input(s): HGBA1C in the last 72 hours. CBG: No results for input(s): GLUCAP in the last 168 hours. Lipid Profile: No results for input(s): CHOL, HDL, LDLCALC, TRIG, CHOLHDL, LDLDIRECT in the last 72 hours. Thyroid Function Tests: Recent Labs    09/18/19 0909  TSH 1.649   Anemia Panel: No results for input(s): VITAMINB12, FOLATE, FERRITIN, TIBC, IRON, RETICCTPCT in the last 72 hours. Urine  analysis:    Component Value Date/Time   COLORURINE YELLOW (A) 09/18/2019 1036   APPEARANCEUR HAZY (A) 09/18/2019 1036   LABSPEC 1.005 09/18/2019 1036   PHURINE 7.0 09/18/2019 1036   GLUCOSEU NEGATIVE 09/18/2019 1036   HGBUR SMALL (A) 09/18/2019 1036   BILIRUBINUR NEGATIVE 09/18/2019 1036   KETONESUR NEGATIVE 09/18/2019 1036   PROTEINUR NEGATIVE 09/18/2019 1036   NITRITE NEGATIVE 09/18/2019 1036   LEUKOCYTESUR LARGE (A) 09/18/2019 1036   Sepsis Labs: @LABRCNTIP (procalcitonin:4,lacticidven:4) )No results found for this or any previous visit (from the past 240 hour(s)).   Radiological Exams on Admission: CT Angio Chest PE W and/or Wo Contrast  Result Date: 09/18/2019 CLINICAL DATA:  09/20/2019 back pain and shortness of breath since last night. No fever or cough. EXAM: CT ANGIOGRAPHY CHEST WITH CONTRAST TECHNIQUE: Multidetector CT imaging of the chest  was performed using the standard protocol during bolus administration of intravenous contrast. Multiplanar CT image reconstructions and MIPs were obtained to evaluate the vascular anatomy. CONTRAST:  30mL OMNIPAQUE IOHEXOL 350 MG/ML SOLN COMPARISON:  None. FINDINGS: Cardiovascular: Satisfactory opacification of the pulmonary arteries to the segmental level. Suspected subsegmental pulmonary embolism in the peripheral lateral basal segment of the left lower lobe (series 9, images 47). Normal heart size. No pericardial effusion. No thoracic aortic aneurysm or dissection. Mediastinum/Nodes: No enlarged mediastinal, hilar, or axillary lymph nodes. Thyroid gland, trachea, and esophagus demonstrate no significant findings. Lungs/Pleura: Trace left pleural effusion. Subsegmental atelectasis in the lingula and left greater than right lower lobes. No consolidation or pneumothorax. 6 mm subpleural nodule in the right lower lobe (series 6, image 41). Upper Abdomen: No acute abnormality. Musculoskeletal: No chest wall abnormality. No acute or significant osseous  findings. Review of the MIP images confirms the above findings. IMPRESSION: 1. Suspected subsegmental pulmonary embolism in the peripheral lateral basal segment of the left lower lobe. 2. Trace left pleural effusion. 3. 6 mm subpleural nodule in the right lower lobe, almost certainly benign. Please note that Fleischner criteria do not apply to patients younger than 35. Electronically Signed   By: Obie Dredge M.D.   On: 09/18/2019 11:31     EKG: Independently reviewed.  Sinus rhythm, QTC 433, LAE, T wave inversion in lead III/aVF, V3-V6.  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  UTI (urinary tract infection): -Rocephin -f/u Bx and Ux.  DVT ppx: on IV Heparin    Code Status: Full code Family Communication: not done, no family member is at bed side.   Disposition Plan:  Anticipate discharge back to previous home environment Consults called:  none Admission status: Med-surg bed for obs   Date of Service 09/18/2019    Lorretta Harp [facility]   If 7PM-7AM, please contact night-coverage [email or web address] 09/18/2019, 5:42 PM

## 2019-09-18 NOTE — ED Notes (Signed)
Family updated as to patient's status. Spoke w/ pt's mom.

## 2019-09-18 NOTE — Consult Note (Signed)
ANTICOAGULATION CONSULT NOTE  Pharmacy Consult for Heparin Indication: pulmonary embolus  Allergies  Allergen Reactions  . Lavender Oil Hives    Patient Measurements: Height: 5\' 5"  (165.1 cm) Weight: 200 lb (90.7 kg) IBW/kg (Calculated) : 57 Heparin Dosing Weight: 77.1 kg  Vital Signs: Temp: 98.6 F (37 C) (02/25 1412) Temp Source: Oral (02/25 1412) BP: 139/97 (02/25 1412) Pulse Rate: 103 (02/25 1412)  Labs: Recent Labs    09/18/19 0909 09/18/19 1301 09/18/19 1426 09/18/19 1920  HGB 13.4  --   --   --   HCT 41.2  --   --   --   PLT 410*  --   --   --   APTT  --  25  --   --   LABPROT  --  14.0  --   --   INR  --  1.1  --   --   HEPARINUNFRC  --   --   --  0.29*  HEPRLOWMOCWT  --  <0.10  --   --   CREATININE 0.72  --   --   --   TROPONINIHS <2  --  <2  --     Estimated Creatinine Clearance: 115.5 mL/min (by C-G formula based on SCr of 0.72 mg/dL).   Medical History: Past Medical History:  Diagnosis Date  . Cerebral palsy (HCC)   . Dysmenorrhea   . History of abnormal cervical Pap smear    + HPV  . History of cerebral palsy   . 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 BLISOVI 24 FE 1-20 MG-MCG(24) tablet TAKE 1 TABLET BY MOUTH EVERY DAY 84 tablet 0    Scheduled:  Infusions:  PRN:  Anti-infectives (From admission, onward)   Start     Dose/Rate Route Frequency Ordered Stop   09/18/19 1800  cefTRIAXone (ROCEPHIN) 2 g in sodium chloride 0.9 % 100 mL IVPB     2 g 200 mL/hr over 30 Minutes Intravenous Every 24 hours 09/18/19 1728        Assessment: Pharmacy consulted to start heparin for PE. No DOAC PTA.   2/25 1920 HL 0.29   Goal of Therapy:  Heparin level 0.3-0.7 units/ml Monitor platelets by anticoagulation protocol: Yes   Plan:  Heparin level slightly subtherapeutic.  Give 1000 units bolus x 1 Increase heparin infusion to 1350  units/hr Check anti-Xa level in 6 hours and daily while on heparin Continue to monitor H&H and platelets  3/25, PharmD, BCPS 09/18/2019,7:47 PM

## 2019-09-18 NOTE — ED Triage Notes (Signed)
presents with some SOB  States started last pm  Denies any fever or cough  Developed sharp pain to back prior becoming SOB

## 2019-09-18 NOTE — ED Notes (Signed)
ED TO INPATIENT HANDOFF REPORT  ED Nurse Name and Phone #: Karena Addison [phone]  S Name/Age/Gender Elizabeth Stanton 29 y.o. female Room/Bed: ED12A/ED12A  Code Status   Code Status: Not on file  Home/SNF/Other Skilled nursing facility    Triage Complete: Triage complete  Chief Complaint Acute pulmonary embolus [facility]) [I26.99]  Triage Note presents with some SOB  States started last pm  Denies any fever or cough  Developed sharp pain to back prior becoming SOB     Allergies Allergies  Allergen Reactions  . Lavender Oil Hives    Level of Care/Admitting Diagnosis ED Disposition    ED Disposition Condition Comment   Admit  Hospital Area: [facility] [[id number]]  Level of Care: Med-Surg [16]  Covid Evaluation: Asymptomatic Screening Protocol (No Symptoms)  Diagnosis: Acute pulmonary embolus [facility]) [[id number]]  Admitting Physician: Ivor Costa [place]  Attending Physician: Ivor Costa [[id number]]       B Medical/Surgery History Past Medical History:  Diagnosis Date  . Cerebral palsy ([place])   . Dysmenorrhea   . History of abnormal cervical Pap smear    + HPV  . History of cerebral palsy   . 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)    Past Surgical History:  Procedure Laterality Date  . COLPOSCOPY       A IV Location/Drains/Wounds Patient Lines/Drains/Airways Status   Active Line/Drains/Airways    Name:   Placement date:   Placement time:   Site:   Days:   Peripheral IV 09/18/19 Left Forearm   09/18/19    0928    Forearm   less than 1          Intake/Output Last 24 hours No intake or output data in the 24 hours ending 09/18/19 1317  Labs/Imaging Results for orders placed or performed during the hospital encounter of 09/18/19 (from the past 48 hour(s))  Basic metabolic panel     Status: Abnormal   Collection Time: 09/18/19  9:09 AM  Result Value Ref Range   Sodium 139 135 - 145 mmol/L   Potassium 4.2 3.5 - 5.1 mmol/L   Chloride 105 98 - 111  mmol/L   CO2 24 22 - 32 mmol/L   Glucose, Bld 86 70 - 99 mg/dL    Comment: Glucose reference range applies only to samples taken after fasting for at least 8 hours.   BUN 9 6 - 20 mg/dL   Creatinine, Ser 0.72 0.44 - 1.00 mg/dL   Calcium 8.8 (L) 8.9 - 10.3 mg/dL   GFR calc non Af Amer >60 >60 mL/min   GFR calc Af Amer >60 >60 mL/min   Anion gap 10 5 - 15    Comment: Performed at [facility], [address] [place], [place] [address]  CBC with Differential     Status: Abnormal   Collection Time: 09/18/19  9:09 AM  Result Value Ref Range   WBC 10.3 4.0 - 10.5 K/uL   RBC 4.48 3.87 - 5.11 MIL/uL   Hemoglobin 13.4 12.0 - 15.0 g/dL   HCT 41.2 36.0 - 46.0 %   MCV 92.0 80.0 - 100.0 fL   MCH 29.9 26.0 - 34.0 pg   MCHC 32.5 30.0 - 36.0 g/dL   RDW 11.4 (L) 11.5 - 15.5 %   Platelets 410 (H) 150 - 400 K/uL   nRBC 0.0 0.0 - 0.2 %   Neutrophils Relative % 67 %   Neutro Abs 6.8 1.7 - 7.7 K/uL  Lymphocytes Relative 22 %   Lymphs Abs 2.2 0.7 - 4.0 K/uL   Monocytes Relative 10 %   Monocytes Absolute 1.0 0.1 - 1.0 K/uL   Eosinophils Relative 1 %   Eosinophils Absolute 0.1 0.0 - 0.5 K/uL   Basophils Relative 0 %   Basophils Absolute 0.0 0.0 - 0.1 K/uL   Immature Granulocytes 0 %   Abs Immature Granulocytes 0.04 0.00 - 0.07 K/uL    Comment: Performed at [facility], [address] [place], [place] [address]  Troponin I (High Sensitivity)     Status: None   Collection Time: 09/18/19  9:09 AM  Result Value Ref Range   Troponin I (High Sensitivity) <2 <18 ng/L    Comment: (NOTE) Elevated high sensitivity troponin I (hsTnI) values and significant  changes across serial measurements may suggest ACS but many other  chronic and acute conditions are known to elevate hsTnI results.  Refer to the "Links" section for chest pain algorithms and additional  guidance. Performed at [facility], [address] [place], [place] [address]   TSH     Status: None    Collection Time: 09/18/19  9:09 AM  Result Value Ref Range   TSH 1.649 0.350 - 4.500 uIU/mL    Comment: Performed by a 3rd Generation assay with a functional sensitivity of <=0.01 uIU/mL. Performed at [facility], [address] [place], [place] [address]   hCG, quantitative, pregnancy     Status: None   Collection Time: 09/18/19  9:09 AM  Result Value Ref Range   hCG, Beta Chain, Quant, S <1 <5 mIU/mL    Comment:          GEST. AGE      CONC.  (mIU/mL)   <=1 WEEK        5 - 50     2 WEEKS       50 - 500     3 WEEKS       100 - 10,000     4 WEEKS     1,000 - 30,000     5 WEEKS     3,500 - 115,000   6-8 WEEKS     12,000 - 270,000    12 WEEKS     15,000 - 220,000        FEMALE AND NON-PREGNANT FEMALE:     LESS THAN 5 mIU/mL Performed at [facility], [address] [place], [place] [address]   Urinalysis, Complete w Microscopic     Status: Abnormal   Collection Time: 09/18/19 10:36 AM  Result Value Ref Range   Color, Urine YELLOW (A) YELLOW   APPearance HAZY (A) CLEAR   Specific Gravity, Urine 1.005 1.005 - 1.030   pH 7.0 5.0 - 8.0   Glucose, UA NEGATIVE NEGATIVE mg/dL   Hgb urine dipstick SMALL (A) NEGATIVE   Bilirubin Urine NEGATIVE NEGATIVE   Ketones, ur NEGATIVE NEGATIVE mg/dL   Protein, ur NEGATIVE NEGATIVE mg/dL   Nitrite NEGATIVE NEGATIVE   Leukocytes,Ua LARGE (A) NEGATIVE   RBC / HPF 0-5 0 - 5 RBC/hpf   WBC, UA 21-50 0 - 5 WBC/hpf   Bacteria, UA MANY (A) NONE SEEN   Squamous Epithelial / LPF 0-5 0 - 5   Mucus PRESENT     Comment: Performed at [facility], [address], [place], [place] [address]  Pregnancy, urine POC     Status: None   Collection Time: 09/18/19 10:41 AM  Result Value Ref Range   Preg Test, Ur NEGATIVE NEGATIVE    Comment:        THE SENSITIVITY OF THIS METHODOLOGY IS >24 mIU/mL    CT Angio Chest PE W and/or Wo Contrast  Result Date: 09/18/2019 CLINICAL DATA:  Lambert Mody back pain and shortness of breath since  last night. No fever or cough. EXAM: CT ANGIOGRAPHY CHEST WITH CONTRAST TECHNIQUE: Multidetector CT imaging of the chest was performed using the standard protocol during bolus administration of intravenous contrast. Multiplanar CT image reconstructions and MIPs were obtained to evaluate the vascular anatomy. CONTRAST:  19mL OMNIPAQUE IOHEXOL 350 MG/ML SOLN COMPARISON:  None. FINDINGS: Cardiovascular: Satisfactory opacification of the pulmonary arteries to the segmental level. Suspected subsegmental pulmonary embolism in the peripheral lateral basal segment of the left lower lobe (series 9, images 47). Normal heart size. No pericardial effusion. No thoracic aortic aneurysm or dissection. Mediastinum/Nodes: No enlarged mediastinal, hilar, or axillary lymph nodes. Thyroid gland, trachea, and esophagus demonstrate no significant findings. Lungs/Pleura: Trace left pleural effusion. Subsegmental atelectasis in the lingula and left greater than right lower lobes. No consolidation or pneumothorax. 6 mm subpleural nodule in the right lower lobe (series 6, image 41). Upper Abdomen: No acute abnormality. Musculoskeletal: No chest wall abnormality. No acute or significant osseous findings. Review of the MIP images confirms the above findings. IMPRESSION: 1. Suspected subsegmental pulmonary embolism in the peripheral lateral basal segment of the left lower lobe. 2. Trace left pleural effusion. 3. 6 mm subpleural nodule in the right lower lobe, almost certainly benign. Please note that Fleischner criteria do not apply to patients younger than 35. Electronically Signed   By: Obie Dredge M.D.   On: 09/18/2019 11:31    Pending Labs Unresulted Labs (From admission, onward)    Start     Ordered   09/18/19 1900  Heparin level (unfractionated)  Once-Timed,   STAT     09/18/19 1226   09/18/19 1238  Urine Culture  Add-on,   AD     09/18/19 1237   09/18/19 1237  SARS CORONAVIRUS 2 (TAT 6-24 HRS) Nasopharyngeal Nasopharyngeal  Swab  (Tier 3 (TAT 6-24 hrs))  ONCE - STAT,   STAT    Question Answer Comment  Is this test for diagnosis or screening Screening   Symptomatic for COVID-19 as defined by CDC No   Hospitalized for COVID-19 No   Admitted to ICU for COVID-19 No   Previously tested for COVID-19 Yes   Resident in a congregate (group) care setting No   Employed in healthcare setting No   Pregnant No      09/18/19 1236   09/18/19 1226  Low molecular wgt heparin (fractionated)  Once,   STAT     09/18/19 1226   09/18/19 1224  APTT  ONCE - STAT,   STAT     09/18/19 1226   09/18/19 1224  Protime-INR  ONCE - STAT,   STAT     09/18/19 1226   Signed and Held  Brain natriuretic peptide  Add-on,   R     Signed and Held   Signed and Held  HIV Antibody (routine testing w rflx)  (HIV Antibody (Routine testing w reflex) panel)  Once,   R     Signed and Held   Signed and Held  Basic metabolic panel  Tomorrow morning,   R     Signed and Held   Signed and Held  CBC  Tomorrow morning,   R  Signed and Held          Vitals/Pain Today's Vitals   09/18/19 0835 09/18/19 0900 09/18/19 0937 09/18/19 1006  BP:  (!) 144/94  (!) 140/96  Pulse:  (!) 108  (!) 106  Resp:  (!) 29  (!) 25  Temp:      TempSrc:      SpO2: 99% 100%  100%  Weight:      Height:      PainSc:   6      Isolation Precautions No active isolations  Medications Medications  heparin ADULT infusion 100 units/mL (25000 units/276mL sodium chloride 0.45%) (1,200 Units/hr Intravenous New Bag/Given 09/18/19 1305)  oxyCODONE-acetaminophen (PERCOCET/ROXICET) 5-325 MG per tablet 1 tablet (has no administration in time range)  morphine 2 MG/ML injection 1 mg (has no administration in time range)  albuterol (PROVENTIL) (2.5 MG/3ML) 0.083% nebulizer solution 2.5 mg (has no administration in time range)  dextromethorphan-guaiFENesin (MUCINEX DM) 30-600 MG per 12 hr tablet 1 tablet (has no administration in time range)  sodium chloride 0.9 % bolus 1,000 mL  (0 mLs Intravenous Stopping Infusion hung by another clincian 09/18/19 1143)  Tdap (BOOSTRIX) injection 0.5 mL (0.5 mLs Intramuscular Given 09/18/19 0921)  iohexol (OMNIPAQUE) 350 MG/ML injection 75 mL (75 mLs Intravenous Contrast Given 09/18/19 1053)  heparin bolus via infusion 3,800 Units (3,800 Units Intravenous Bolus from Bag 09/18/19 1308)    Mobility non-ambulatory Low fall risk   Focused Assessments    R Recommendations: See Admitting Provider Note  Report given to:

## 2019-09-19 ENCOUNTER — Observation Stay (HOSPITAL_BASED_OUTPATIENT_CLINIC_OR_DEPARTMENT_OTHER)
Admit: 2019-09-19 | Discharge: 2019-09-19 | Disposition: A | Discharge status: Home / Self Care | Payer: BC Managed Care – PPO | Attending: Internal Medicine | Admitting: Internal Medicine

## 2019-09-19 ENCOUNTER — Observation Stay: Admit: 2019-09-19 | Payer: BC Managed Care – PPO

## 2019-09-19 ENCOUNTER — Observation Stay: Payer: BC Managed Care – PPO

## 2019-09-19 DIAGNOSIS — I2699 Other pulmonary embolism without acute cor pulmonale: Secondary | ICD-10-CM | POA: Diagnosis not present

## 2019-09-19 DIAGNOSIS — I2609 Other pulmonary embolism with acute cor pulmonale: Secondary | ICD-10-CM | POA: Diagnosis not present

## 2019-09-19 LAB — BASIC METABOLIC PANEL
Anion gap: 8 (ref 5–15)
BUN: 9 mg/dL (ref 6–20)
CO2: 24 mmol/L (ref 22–32)
Calcium: 8.6 mg/dL — ABNORMAL LOW (ref 8.9–10.3)
Chloride: 105 mmol/L (ref 98–111)
Creatinine, Ser: 0.62 mg/dL (ref 0.44–1.00)
GFR calc Af Amer: >60 mL/min (ref >60)
GFR calc non Af Amer: >60 mL/min (ref >60)
Glucose, Bld: 102 mg/dL — ABNORMAL HIGH (ref 70–99)
Potassium: 3.8 mmol/L (ref 3.5–5.1)
Sodium: 137 mmol/L (ref 135–145)

## 2019-09-19 LAB — CBC
HCT: 36.9 % (ref 36.0–46.0)
Hemoglobin: 12.1 g/dL (ref 12.0–15.0)
MCH: 30.5 pg (ref 26.0–34.0)
MCHC: 32.8 g/dL (ref 30.0–36.0)
MCV: 92.9 fL (ref 80.0–100.0)
Platelets: 393 10*3/uL (ref 150–400)
RBC: 3.97 MIL/uL (ref 3.87–5.11)
RDW: 11.6 % (ref 11.5–15.5)
WBC: 10 10*3/uL (ref 4.0–10.5)
nRBC: 0 % (ref 0.0–0.2)

## 2019-09-19 LAB — HEPARIN LEVEL (UNFRACTIONATED)
Heparin Unfractionated: 0.52 IU/mL (ref 0.30–0.70)
Heparin Unfractionated: 0.47 IU/mL (ref 0.30–0.70)

## 2019-09-19 LAB — ANTITHROMBIN III: AntiThromb III Func: 85 % (ref 75–120)

## 2019-09-19 LAB — ECHOCARDIOGRAM COMPLETE
Height: 65 in
Weight: 3200 oz

## 2019-09-19 LAB — URINE CULTURE

## 2019-09-19 MED ORDER — RIVAROXABAN 15 MG PO TABS
15.0000 mg | ORAL_TABLET | Freq: Two times a day (BID) | ORAL | Status: DC
  Administered 2019-09-19: 15 mg via ORAL
  Filled 2019-09-19 (×2): qty 1

## 2019-09-19 MED ORDER — RIVAROXABAN 20 MG PO TABS: 20.0000 mg | ORAL_TABLET | Freq: Every day | ORAL | Status: DC

## 2019-09-19 MED ORDER — CEFDINIR 300 MG PO CAPS: 300.0000 mg | ORAL_CAPSULE | Freq: Two times a day (BID) | ORAL | 0 refills | Status: AC

## 2019-09-19 MED ORDER — RIVAROXABAN (XARELTO) VTE STARTER PACK (15 & 20 MG): ORAL_TABLET | ORAL | 0 refills | Status: DC

## 2019-09-19 NOTE — Care Management (Signed)
Anticipate to discharge on Xarelto.  Provided 30 day trial, and $10 copay card

## 2019-09-19 NOTE — Progress Notes (Signed)
*  PRELIMINARY RESULTS* Echocardiogram 2D Echocardiogram has been performed.  Elizabeth Stanton 09/19/2019, 11:50 AM

## 2019-09-19 NOTE — Consult Note (Signed)
ANTICOAGULATION CONSULT NOTE  Pharmacy Consult for Heparin Indication: pulmonary embolus  Allergies  Allergen Reactions  . Lavender Oil Hives    Patient Measurements: Height: 5\' 5"  (165.1 cm) Weight: 200 lb (90.7 kg) IBW/kg (Calculated) : 57 Heparin Dosing Weight: 77.1 kg  Vital Signs: Temp: 98.4 F (36.9 C) (02/25 2111) Temp Source: Oral (02/25 2111) BP: 129/88 (02/25 2111) Pulse Rate: 99 (02/25 2111)  Labs: Recent Labs    09/18/19 0909 09/18/19 1301 09/18/19 1426 09/18/19 1920 09/19/19 0209  HGB 13.4  --   --   --  12.1  HCT 41.2  --   --   --  36.9  PLT 410*  --   --   --  393  APTT  --  25  --   --   --   LABPROT  --  14.0  --   --   --   INR  --  1.1  --   --   --   HEPARINUNFRC  --   --   --  0.29* 0.52  HEPRLOWMOCWT  --  <0.10  --   --   --   CREATININE 0.72  --   --   --  0.62  TROPONINIHS <2  --  <2  --   --     Estimated Creatinine Clearance: 115.5 mL/min (by C-G formula based on SCr of 0.62 mg/dL).   Medical History: Past Medical History:  Diagnosis Date  . Cerebral palsy (HCC)   . Dysmenorrhea   . History of abnormal cervical Pap smear    + HPV  . History of cerebral palsy   . 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 BLISOVI 24 FE 1-20 MG-MCG(24) tablet TAKE 1 TABLET BY MOUTH EVERY DAY 84 tablet 0    Scheduled:  Infusions:  PRN:  Anti-infectives (From admission, onward)   Start     Dose/Rate Route Frequency Ordered Stop   09/18/19 1800  cefTRIAXone (ROCEPHIN) 2 g in sodium chloride 0.9 % 100 mL IVPB     2 g 200 mL/hr over 30 Minutes Intravenous Every 24 hours 09/18/19 1728        Assessment: Pharmacy consulted to start heparin for PE. No DOAC PTA.   2/25 1920 HL 0.29   Goal of Therapy:  Heparin level 0.3-0.7 units/ml Monitor platelets by anticoagulation protocol: Yes   Plan:  02/26 @ 0200 HL 0.52 therapeutic. Will  continue current rate at 1350 units/hr and will recheck HL at 0800, CBC trended down, but stable will continue to monitor.  3/26, PharmD, BCPS 09/19/2019,2:40 AM

## 2019-09-19 NOTE — Discharge Summary (Signed)
Physician Discharge Summary  Elizabeth Stanton WPY:[id number] DOB: 29-Jul-1990 DOA: 09/18/2019  PCP: Georgina Quint, MD  Admit date: 09/18/2019 Discharge date: 09/19/2019  Admitted From: Home Disposition: Home  Recommendations for Outpatient Follow-up:  1. Follow up with PCP in 1-2 weeks 2. Please obtain BMP/CBC in one week 3. Please follow up on the following pending results: Urine culture.  Hypercoagulable labs.  Home Health: No Equipment/Devices: None Discharge Condition: Stable CODE STATUS: Full Diet recommendation: Regular   Brief/Interim Summary: Varie Elizabeth Stanton is a 29 y.o. female with medical history significant of cerebral palsy, stroke, right arm weakkness, who presents with SOB.  Found to have subsegmental PE.  Was on oral contraceptives which increases the risk of VTE.  Lower extremity venous Doppler were without any DVT.  Echocardiogram was within normal limit.  She was started on heparin infusion on admission and then transition to Xarelto for discharge. Hypercoagulable labs were drawn during hospitalization and results were pending on discharge. She was advised to discussed her contraceptive measures with her gynecologist and avoid oral contraceptive pills.   She will follow up with primary care physician for further management.  She was also found to have UTI with UA, having mild lower abdominal discomfort and increased urinary frequency and urgency.  Urine culture results were pending on discharge.  She was treated with ceftriaxone in the hospital and discharged on cefdinir for 3 days.  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 As we discussed please stop taking birth control pills as they increase the chance of clotting. We are starting you on Xarelto for your lung clot, you will take it 15 mg twice daily for 3 weeks and then start taking 20 mg daily, you are provided with 2 separate prescriptions please take it as directed. I am also giving you 3 days of antibiotics as your urine looks infected.  We will call you if your culture shows susceptibility to different antibiotics. Please follow-up with your primary care physician.   Increase activity slowly   Complete by: As directed      Allergies as of 09/19/2019      Reactions   Lavender Oil Hives      Medication List    STOP taking these medications   Blisovi 24 Fe 1-20 MG-MCG(24) tablet Generic drug: Norethindrone Acetate-Ethinyl Estrad-FE     TAKE these medications   cefdinir 300 MG capsule Commonly known as: OMNICEF Take 1 capsule (300 mg total) by mouth 2 (two) times daily for 3 days.   Rivaroxaban 15 & 20 MG Tbpk Follow package directions: Take one 15mg  tablet by mouth twice a day. On day 22, switch to one 20mg  tablet once a day. Take with food.       Allergies  Allergen Reactions  . Lavender Oil Hives    Consultations:  None  Procedures/Studies: CT HEAD WO CONTRAST  Result Date: 09/19/2019 CLINICAL DATA:  Head trauma. Headache. EXAM: CT HEAD WITHOUT CONTRAST TECHNIQUE: Contiguous axial images were obtained from the base of the skull through the vertex without intravenous contrast. COMPARISON:  None. FINDINGS: Brain: No acute  infarct, hemorrhage, or mass lesion is present. A focal area of left periventricular white matter hypoattenuation is noted. No other significant white matter disease is present. Basal ganglia are intact. Insular ribbon is normal. No focal cortical abnormalities are present. The brainstem and cerebellum are normal. Vascular: No hyperdense vessel or unexpected calcification. Skull:  Calvarium is intact. No focal lytic or blastic lesions are present. No significant extracranial soft tissue lesion is present. Sinuses/Orbits: The paranasal sinuses and mastoid air cells are clear. The globes and orbits are within normal limits. IMPRESSION: 1. Focal area of left periventricular white matter hypoattenuation. This is consistent with known remote infarct. 2. Otherwise normal CT of the head. Electronically Signed   By: San Morelle M.D.   On: 09/19/2019 07:25   CT Angio Chest PE W and/or Wo Contrast  Result Date: 09/18/2019 CLINICAL DATA:  Hervey Ard back pain and shortness of breath since last night. No fever or cough. EXAM: CT ANGIOGRAPHY CHEST WITH CONTRAST TECHNIQUE: Multidetector CT imaging of the chest was performed using the standard protocol during bolus administration of intravenous contrast. Multiplanar CT image reconstructions and MIPs were obtained to evaluate the vascular anatomy. CONTRAST:  78mL OMNIPAQUE IOHEXOL 350 MG/ML SOLN COMPARISON:  None. FINDINGS: Cardiovascular: Satisfactory opacification of the pulmonary arteries to the segmental level. Suspected subsegmental pulmonary embolism in the peripheral lateral basal segment of the left lower lobe (series 9, images 47). Normal heart size. No pericardial effusion. No thoracic aortic aneurysm or dissection. Mediastinum/Nodes: No enlarged mediastinal, hilar, or axillary lymph nodes. Thyroid gland, trachea, and esophagus demonstrate no significant findings. Lungs/Pleura: Trace left pleural effusion. Subsegmental atelectasis in the lingula and left greater than right lower lobes. No consolidation or pneumothorax. 6 mm subpleural nodule in the right lower lobe (series 6, image 41). Upper Abdomen: No acute abnormality. Musculoskeletal: No chest wall abnormality. No acute or significant osseous findings. Review of the MIP images confirms the above findings. IMPRESSION: 1. Suspected subsegmental pulmonary embolism in the peripheral  lateral basal segment of the left lower lobe. 2. Trace left pleural effusion. 3. 6 mm subpleural nodule in the right lower lobe, almost certainly benign. Please note that Fleischner criteria do not apply to patients younger than 35. Electronically Signed   By: Titus Dubin M.D.   On: 09/18/2019 11:31   US Venous Img Lower Bilateral (DVT)  Result Date: 09/19/2019 CLINICAL DATA:  Pulmonary emboli EXAM: BILATERAL LOWER EXTREMITY VENOUS DUPLEX ULTRASOUND TECHNIQUE: Gray-scale sonography with graded compression, as well as color Doppler and duplex ultrasound were performed to evaluate the lower extremity deep venous systems from the level of the common femoral vein and including the common femoral, femoral, profunda femoral, popliteal and calf veins including the posterior tibial, peroneal and gastrocnemius veins when visible. The superficial great saphenous vein was also interrogated. Spectral Doppler was utilized to evaluate flow at rest and with distal augmentation maneuvers in the common femoral, femoral and popliteal veins. COMPARISON:  None. FINDINGS: RIGHT LOWER EXTREMITY Common Femoral Vein: No evidence of thrombus. Normal compressibility, respiratory phasicity and response to augmentation. Saphenofemoral Junction: No evidence of thrombus. Normal compressibility and flow on color Doppler imaging. Profunda Femoral Vein: No evidence of thrombus. Normal compressibility and flow on color Doppler imaging. Femoral Vein: No evidence of thrombus. Normal compressibility, respiratory phasicity and response to augmentation. Popliteal Vein: No evidence of thrombus. Normal compressibility, respiratory phasicity and response to augmentation. Calf Veins: No evidence of thrombus. Normal compressibility and flow on color Doppler imaging. Superficial Great Saphenous Vein: No evidence  of thrombus. Normal compressibility. Venous Reflux:  None. Other Findings:  None. LEFT LOWER EXTREMITY Common Femoral Vein: No evidence of  thrombus. Normal compressibility, respiratory phasicity and response to augmentation. Saphenofemoral Junction: No evidence of thrombus. Normal compressibility and flow on color Doppler imaging. Profunda Femoral Vein: No evidence of thrombus. Normal compressibility and flow on color Doppler imaging. Femoral Vein: No evidence of thrombus. Normal compressibility, respiratory phasicity and response to augmentation. Popliteal Vein: No evidence of thrombus. Normal compressibility, respiratory phasicity and response to augmentation. Calf Veins: No evidence of thrombus. Normal compressibility and flow on color Doppler imaging. Superficial Great Saphenous Vein: No evidence of thrombus. Normal compressibility. Venous Reflux:  None. Other Findings:  None. IMPRESSION: No evidence of deep venous thrombosis in either lower extremity. Electronically Signed   By: Bretta BangWilliam  Woodruff III M.D.   On: 09/19/2019 08:59   ECHOCARDIOGRAM COMPLETE  Result Date: 09/19/2019    ECHOCARDIOGRAM REPORT   Patient Name:   Foster SimpsonCAPTORIA Paddock Date of Exam: 09/19/2019 Medical Rec #:  [id number]      Height:       65.0 in Accession #:    [id number]     Weight:       200.0 lb Date of Birth:  08/21/1990       BSA:          1.978 m Patient Age:    29 years       BP:           123/83 mmHg Patient Gender: F              HR:           85 bpm. Exam Location:  [facility] Procedure: 2D Echo, Color Doppler and Cardiac Doppler Indications:     I26.99 Pulmonary Embolus  History:         Patient has no prior history of Echocardiogram examinations.                  Stroke. Cerebral palsy.  Sonographer:     Humphrey RollsJoan Heiss RDCS (AE) Referring Phys:  Kern [id number] Brien FewXILIN NIU Diagnosing Phys: Julien Nordmannimothy Gollan MD  Sonographer Comments: Suboptimal apical window and suboptimal subcostal window. IMPRESSIONS  1. Left ventricular ejection fraction, by estimation, is 60 to 65%. The left ventricle has normal function. The left ventricle has no regional wall motion abnormalities. Left ventricular  diastolic parameters were normal.  2. Right ventricular systolic function is normal. The right ventricular size is normal.  3. The inferior vena cava is normal in size with greater than 50% respiratory variability, suggesting right atrial pressure of 3 mmHg. FINDINGS  Left Ventricle: Left ventricular ejection fraction, by estimation, is 60 to 65%. The left ventricle has normal function. The left ventricle has no regional wall motion abnormalities. The left ventricular internal cavity size was normal in size. There is  no left ventricular hypertrophy. Left ventricular diastolic parameters were normal. Right Ventricle: The right ventricular size is normal. No increase in right ventricular wall thickness. Right ventricular systolic function is normal. Left Atrium: Left atrial size was normal in size. Right Atrium: Right atrial size was normal in size. Pericardium: There is no evidence of pericardial effusion. Mitral Valve: The mitral valve is normal in structure and function. Normal mobility of the mitral valve leaflets. No evidence of mitral valve regurgitation. No evidence of mitral valve stenosis. MV peak gradient, 3.9 mmHg. The mean mitral valve gradient is 2.0 mmHg. Tricuspid Valve: The tricuspid valve is normal in structure. Tricuspid  valve regurgitation is mild . No evidence of tricuspid stenosis. Aortic Valve: The aortic valve is normal in structure and function. Aortic valve regurgitation is not visualized. No aortic stenosis is present. Aortic valve mean gradient measures 2.0 mmHg. Aortic valve peak gradient measures 4.8 mmHg. Aortic valve area, by VTI measures 1.93 cm. Pulmonic Valve: The pulmonic valve was normal in structure. Pulmonic valve regurgitation is not visualized. No evidence of pulmonic stenosis. Aorta: The aortic root is normal in size and structure. Venous: The inferior vena cava is normal in size with greater than 50% respiratory variability, suggesting right atrial pressure of 3 mmHg.  IAS/Shunts: No atrial level shunt detected by color flow Doppler.  LEFT VENTRICLE PLAX 2D LVIDd:         4.32 cm  Diastology LVIDs:         2.57 cm  LV e' lateral:   9.90 cm/s LV PW:         1.01 cm  LV E/e' lateral: 8.8 LV IVS:        0.78 cm  LV e' medial:    11.10 cm/s LVOT diam:     2.00 cm  LV E/e' medial:  7.8 LV SV:         39 LV SV Index:   20 LVOT Area:     3.14 cm  RIGHT VENTRICLE RV Basal diam:  2.93 cm LEFT ATRIUM             Index       RIGHT ATRIUM           Index LA diam:        2.70 cm 1.36 cm/m  RA Area:     14.10 cm LA Vol (A2C):   28.8 ml 14.56 ml/m RA Volume:   36.10 ml  18.25 ml/m LA Vol (A4C):   47.4 ml 23.96 ml/m LA Biplane Vol: 39.6 ml 20.02 ml/m  AORTIC VALVE                   PULMONIC VALVE AV Area (Vmax):    2.15 cm    PV Vmax:       1.00 m/s AV Area (Vmean):   2.13 cm    PV Vmean:      65.500 cm/s AV Area (VTI):     1.93 cm    PV VTI:        0.167 m AV Vmax:           109.00 cm/s PV Peak grad:  4.0 mmHg AV Vmean:          72.200 cm/s PV Mean grad:  2.0 mmHg AV VTI:            0.200 m AV Peak Grad:      4.8 mmHg AV Mean Grad:      2.0 mmHg LVOT Vmax:         74.60 cm/s LVOT Vmean:        48.900 cm/s LVOT VTI:          0.123 m LVOT/AV VTI ratio: 0.62  AORTA Ao Root diam: 2.80 cm MITRAL VALVE MV Area (PHT): 4.60 cm    SHUNTS MV Peak grad:  3.9 mmHg    Systemic VTI:  0.12 m MV Mean grad:  2.0 mmHg    Systemic Diam: 2.00 cm MV Vmax:       0.98 m/s MV Vmean:      55.9 cm/s MV Decel Time: 165 msec MV E  velocity: 86.80 cm/s MV A velocity: 53.40 cm/s MV E/A ratio:  1.63 Julien Nordmann MD Electronically signed by Julien Nordmann MD Signature Date/Time: 09/19/2019/1:02:53 PM    Final     Subjective: Patient was feeling better when seen during morning rounds.  No new complaints.  She would like to go back home.  Discharge Exam: Vitals:   09/18/19 2111 09/19/19 0453  BP: 129/88 123/83  Pulse: 99 87  Resp: 20 20  Temp: 98.4 F (36.9 C) 98.3 F (36.8 C)  SpO2: 100% 100%    Vitals:   09/18/19 1330 09/18/19 1412 09/18/19 2111 09/19/19 0453  BP: (!) 142/98 (!) 139/97 129/88 123/83  Pulse: (!) 109 (!) 103 99 87  Resp: (!) 26  20 20   Temp:  98.6 F (37 C) 98.4 F (36.9 C) 98.3 F (36.8 C)  TempSrc:  Oral Oral Oral  SpO2: 99% 99% 100% 100%  Weight:      Height:        General: Pt is alert, awake, not in acute distress Cardiovascular: RRR, S1/S2 +, no rubs, no gallops Respiratory: CTA bilaterally, no wheezing, no rhonchi Abdominal: Soft, NT, ND, bowel sounds + Extremities: no edema, no cyanosis   The results of significant diagnostics from this hospitalization (including imaging, microbiology, ancillary and laboratory) are listed below for reference.    Microbiology: Recent Results (from the past 240 hour(s))  SARS CORONAVIRUS 2 (TAT 6-24 HRS) Nasopharyngeal Nasopharyngeal Swab     Status: None   Collection Time: 09/18/19  1:01 PM   Specimen: Nasopharyngeal Swab  Result Value Ref Range Status   SARS Coronavirus 2 NEGATIVE NEGATIVE Final    Comment: (NOTE) SARS-CoV-2 target nucleic acids are NOT DETECTED. The SARS-CoV-2 RNA is generally detectable in upper and lower respiratory specimens during the acute phase of infection. Negative results do not preclude SARS-CoV-2 infection, do not rule out co-infections with other pathogens, and should not be used as the sole basis for treatment or other patient management decisions. Negative results must be combined with clinical observations, patient history, and epidemiological information. The expected result is Negative. Fact Sheet for Patients: 09/20/19 Fact Sheet for Healthcare Providers: HairSlick.no This test is not yet approved or cleared by the [place] FDA and  has been authorized for detection and/or diagnosis of SARS-CoV-2 by FDA under an Emergency Use Authorization (EUA). This EUA will remain  in effect (meaning this test  can be used) for the duration of the COVID-19 declaration under Section 56 4(b)(1) of the Act, 21 U.S.C. section 360bbb-3(b)(1), unless the authorization is terminated or revoked sooner. Performed at [facility], [address], [place], [place]      Labs: BNP (last 3 results) Recent Labs    09/18/19 1426  BNP 22.0   Basic Metabolic Panel: Recent Labs  Lab 09/18/19 0909 09/19/19 0209  NA 139 137  K 4.2 3.8  CL 105 105  CO2 24 24  GLUCOSE 86 102*  BUN 9 9  CREATININE 0.72 0.62  CALCIUM 8.8* 8.6*   Liver Function Tests: No results for input(s): AST, ALT, ALKPHOS, BILITOT, PROT, ALBUMIN in the last 168 hours. No results for input(s): LIPASE, AMYLASE in the last 168 hours. No results for input(s): AMMONIA in the last 168 hours. CBC: Recent Labs  Lab 09/18/19 0909 09/19/19 0209  WBC 10.3 10.0  NEUTROABS 6.8  --   HGB 13.4 12.1  HCT 41.2 36.9  MCV 92.0 92.9  PLT 410* 393  Cardiac Enzymes: No results for input(s): CKTOTAL, CKMB, CKMBINDEX, TROPONINI in the last 168 hours. BNP: Invalid input(s): POCBNP CBG: No results for input(s): GLUCAP in the last 168 hours. D-Dimer No results for input(s): DDIMER in the last 72 hours. Hgb A1c No results for input(s): HGBA1C in the last 72 hours. Lipid Profile No results for input(s): CHOL, HDL, LDLCALC, TRIG, CHOLHDL, LDLDIRECT in the last 72 hours. Thyroid function studies Recent Labs    09/18/19 0909  TSH 1.649   Anemia work up No results for input(s): VITAMINB12, FOLATE, FERRITIN, TIBC, IRON, RETICCTPCT in the last 72 hours. Urinalysis    Component Value Date/Time   COLORURINE YELLOW (A) 09/18/2019 1036   APPEARANCEUR HAZY (A) 09/18/2019 1036   LABSPEC 1.005 09/18/2019 1036   PHURINE 7.0 09/18/2019 1036   GLUCOSEU NEGATIVE 09/18/2019 1036   HGBUR SMALL (A) 09/18/2019 1036   BILIRUBINUR NEGATIVE 09/18/2019 1036   KETONESUR NEGATIVE 09/18/2019 1036   PROTEINUR NEGATIVE 09/18/2019 1036    NITRITE NEGATIVE 09/18/2019 1036   LEUKOCYTESUR LARGE (A) 09/18/2019 1036   Sepsis Labs Invalid input(s): PROCALCITONIN,  WBC,  LACTICIDVEN Microbiology Recent Results (from the past 240 hour(s))  SARS CORONAVIRUS 2 (TAT 6-24 HRS) Nasopharyngeal Nasopharyngeal Swab     Status: None   Collection Time: 09/18/19  1:01 PM   Specimen: Nasopharyngeal Swab  Result Value Ref Range Status   SARS Coronavirus 2 NEGATIVE NEGATIVE Final    Comment: (NOTE) SARS-CoV-2 target nucleic acids are NOT DETECTED. The SARS-CoV-2 RNA is generally detectable in upper and lower respiratory specimens during the acute phase of infection. Negative results do not preclude SARS-CoV-2 infection, do not rule out co-infections with other pathogens, and should not be used as the sole basis for treatment or other patient management decisions. Negative results must be combined with clinical observations, patient history, and epidemiological information. The expected result is Negative. Fact Sheet for Patients: HairSlick.no Fact Sheet for Healthcare Providers: quierodirigir.com This test is not yet approved or cleared by the [place] FDA and  has been authorized for detection and/or diagnosis of SARS-CoV-2 by FDA under an Emergency Use Authorization (EUA). This EUA will remain  in effect (meaning this test can be used) for the duration of the COVID-19 declaration under Section 56 4(b)(1) of the Act, 21 U.S.C. section 360bbb-3(b)(1), unless the authorization is terminated or revoked sooner. Performed at [facility], [address] [place], [place] [address]     Time coordinating discharge: Over 30 minutes  SIGNED:  Arnetha Courser, MD  [facility] 09/19/2019, 2:18 PM  If 7PM-7AM, please contact night-coverage [email or web address]  This record has been created using Conservation officer, historic buildings. Errors have been sought and corrected,but may  not always be located. Such creation errors do not reflect on the standard of care.

## 2019-09-19 NOTE — Consult Note (Signed)
ANTICOAGULATION CONSULT NOTE  Pharmacy Consult for Xarelto Indication: pulmonary embolus  Patient Measurements: Height: 5\' 5"  (165.1 cm) Weight: 200 lb (90.7 kg) IBW/kg (Calculated) : 57 Heparin Dosing Weight: 77.1 kg  Vital Signs: Temp: 98.3 F (36.8 C) (02/26 0453) Temp Source: Oral (02/26 0453) BP: 123/83 (02/26 0453) Pulse Rate: 87 (02/26 0453)  Labs: Recent Labs    09/18/19 0909 09/18/19 1301 09/18/19 1426 09/18/19 1920 09/19/19 0209  HGB 13.4  --   --   --  12.1  HCT 41.2  --   --   --  36.9  PLT 410*  --   --   --  393  APTT  --  25  --   --   --   LABPROT  --  14.0  --   --   --   INR  --  1.1  --   --   --   HEPARINUNFRC  --   --   --  0.29* 0.52  HEPRLOWMOCWT  --  <0.10  --   --   --   CREATININE 0.72  --   --   --  0.62  TROPONINIHS <2  --  <2  --   --     Estimated Creatinine Clearance: 115.5 mL/min (by C-G formula based on SCr of 0.62 mg/dL).   Medical History: Past Medical History:  Diagnosis Date  . Cerebral palsy (HCC)   . Dysmenorrhea   . History of abnormal cervical Pap smear    + HPV  . History of cerebral palsy   . 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 BLISOVI 24 FE 1-20 MG-MCG(24) tablet TAKE 1 TABLET BY MOUTH EVERY DAY 84 tablet 0    Scheduled:  Infusions:  PRN:  Anti-infectives (From admission, onward)   Start     Dose/Rate Route Frequency Ordered Stop   09/18/19 1800  cefTRIAXone (ROCEPHIN) 2 g in sodium chloride 0.9 % 100 mL IVPB     2 g 200 mL/hr over 30 Minutes Intravenous Every 24 hours 09/18/19 1728        Assessment: 29 y.o. female with medical history significant of cerebral palsy, stroke, right arm weakkness, who presents with SOB. Pharmacy was consulted to start heparin for the PE and shee is now being transitioned to [place]. CT shows a subsegmental pulmonary embolism in the peripheral lateral basal segment of  the left lower lobe  No DOAC PTA. H&H, platelets wnl and stable   Goal of Therapy:  Heparin level 0.3-0.7 units/ml Monitor platelets by anticoagulation protocol: Yes   Plan:   Stop heparin infusion  One hour after heparin infusion is stopped start rivaroxaban 15 mg twice daily with food for 21 days followed by 20 mg once daily with food  If she is not discharged later in the day CBC in am  [place], PharmD, BCPS 09/19/2019,7:35 AM

## 2019-09-20 LAB — CARDIOLIPIN ANTIBODIES, IGG, IGM, IGA
Anticardiolipin IgA: <9 APL U/mL (ref 0–11)
Anticardiolipin IgG: <9 GPL U/mL (ref 0–14)
Anticardiolipin IgM: <9 MPL U/mL (ref 0–12)

## 2019-09-20 LAB — HOMOCYSTEINE: Homocysteine: 7.4 umol/L (ref 0.0–14.5)

## 2019-09-21 LAB — PROTEIN S ACTIVITY: Protein S Activity: 62 % — ABNORMAL LOW (ref 63–140)

## 2019-09-21 LAB — LUPUS ANTICOAGULANT PANEL
DRVVT: 25.8 s (ref 0.0–47.0)
PTT Lupus Anticoagulant: 41 s (ref 0.0–51.9)

## 2019-09-21 LAB — PROTEIN S, TOTAL: Protein S Ag, Total: 73 % (ref 60–150)

## 2019-09-21 LAB — PROTEIN C, TOTAL: Protein C, Total: 83 % (ref 60–150)

## 2019-09-21 LAB — PROTEIN C ACTIVITY: Protein C Activity: 111 % (ref 73–180)

## 2019-09-22 LAB — BETA-2-GLYCOPROTEIN I ABS, IGG/M/A
Beta-2 Glyco I IgG: <9 GPI IgG units (ref 0–20)
Beta-2-Glycoprotein I IgA: <9 GPI IgA units (ref 0–25)
Beta-2-Glycoprotein I IgM: <9 GPI IgM units (ref 0–32)

## 2019-09-23 LAB — PROTHROMBIN GENE MUTATION

## 2019-09-24 ENCOUNTER — Encounter: Payer: Self-pay | Admitting: Emergency Medicine

## 2019-09-24 ENCOUNTER — Ambulatory Visit: Payer: BC Managed Care – PPO | Admitting: Emergency Medicine

## 2019-09-24 ENCOUNTER — Other Ambulatory Visit: Payer: Self-pay

## 2019-09-24 VITALS — BP 141/89 | HR 85 | Temp 98.3°F | Resp 16 | Ht 65.0 in | Wt 207.0 lb

## 2019-09-24 DIAGNOSIS — Z8669 Personal history of other diseases of the nervous system and sense organs: Secondary | ICD-10-CM | POA: Diagnosis not present

## 2019-09-24 DIAGNOSIS — G8191 Hemiplegia, unspecified affecting right dominant side: Secondary | ICD-10-CM

## 2019-09-24 DIAGNOSIS — I6930 Unspecified sequelae of cerebral infarction: Secondary | ICD-10-CM | POA: Diagnosis not present

## 2019-09-24 DIAGNOSIS — T45515A Adverse effect of anticoagulants, initial encounter: Secondary | ICD-10-CM

## 2019-09-24 DIAGNOSIS — I6310 Cerebral infarction due to embolism of unspecified precerebral artery: Secondary | ICD-10-CM

## 2019-09-24 DIAGNOSIS — I2699 Other pulmonary embolism without acute cor pulmonale: Secondary | ICD-10-CM

## 2019-09-24 DIAGNOSIS — D6859 Other primary thrombophilia: Secondary | ICD-10-CM | POA: Insufficient documentation

## 2019-09-24 LAB — CBC WITH DIFFERENTIAL/PLATELET
Basophils Absolute: 0 10*3/uL (ref 0.0–0.2)
Basos: 1 %
EOS (ABSOLUTE): 0.1 10*3/uL (ref 0.0–0.4)
Eos: 2 %
Hematocrit: 41.8 % (ref 34.0–46.6)
Hemoglobin: 13.8 g/dL (ref 11.1–15.9)
Immature Grans (Abs): 0 10*3/uL (ref 0.0–0.1)
Immature Granulocytes: 0 %
Lymphocytes Absolute: 2.5 10*3/uL (ref 0.7–3.1)
Lymphs: 37 %
MCH: 29.9 pg (ref 26.6–33.0)
MCHC: 33 g/dL (ref 31.5–35.7)
MCV: 91 fL (ref 79–97)
Monocytes Absolute: 0.6 10*3/uL (ref 0.1–0.9)
Monocytes: 10 %
Neutrophils Absolute: 3.3 10*3/uL (ref 1.4–7.0)
Neutrophils: 50 %
Platelets: 521 10*3/uL — ABNORMAL HIGH (ref 150–450)
RBC: 4.62 x10E6/uL (ref 3.77–5.28)
RDW: 11.5 % — ABNORMAL LOW (ref 11.7–15.4)
WBC: 6.6 10*3/uL (ref 3.4–10.8)

## 2019-09-24 LAB — COMPREHENSIVE METABOLIC PANEL
ALT: 17 IU/L (ref 0–32)
AST: 15 IU/L (ref 0–40)
Albumin/Globulin Ratio: 1 — ABNORMAL LOW (ref 1.2–2.2)
Albumin: 4 g/dL (ref 3.9–5.0)
Alkaline Phosphatase: 50 IU/L (ref 39–117)
BUN/Creatinine Ratio: 12 (ref 9–23)
BUN: 9 mg/dL (ref 6–20)
Bilirubin Total: <0.2 mg/dL (ref 0.0–1.2)
CO2: 21 mmol/L (ref 20–29)
Calcium: 9.8 mg/dL (ref 8.7–10.2)
Chloride: 104 mmol/L (ref 96–106)
Creatinine, Ser: 0.73 mg/dL (ref 0.57–1.00)
GFR calc Af Amer: 129 mL/min/{1.73_m2} (ref >59)
GFR calc non Af Amer: 112 mL/min/{1.73_m2} (ref >59)
Globulin, Total: 4 g/dL (ref 1.5–4.5)
Glucose: 94 mg/dL (ref 65–99)
Potassium: 4.4 mmol/L (ref 3.5–5.2)
Sodium: 139 mmol/L (ref 134–144)
Total Protein: 8 g/dL (ref 6.0–8.5)

## 2019-09-24 LAB — FACTOR 5 LEIDEN

## 2019-09-24 NOTE — Patient Instructions (Addendum)
If you have lab work done today you will be contacted with your lab results within the next 2 weeks.  If you have not heard from [place] then please contact us. The fastest way to get your results is to register for My Chart.   IF you received an x-ray today, you will receive an invoice from [facility]. Please contact [facility] at [phone] with questions or concerns regarding your invoice.   IF you received labwork today, you will receive an invoice from [facility]. Please contact [facility] at [phone] with questions or concerns regarding your invoice.   Our billing staff will not be able to assist you with questions regarding bills from these companies.  You will be contacted with the lab results as soon as they are available. The fastest way to get your results is to activate your My Chart account. Instructions are located on the last page of this paperwork. If you have not heard from [place] regarding the results in 2 weeks, please contact this office.     Pulmonary Embolism  A pulmonary embolism (PE) is a sudden blockage or decrease of blood flow in one or both lungs. Most blockages come from a blood clot that forms in the vein of a lower leg, thigh, or arm (deep vein thrombosis, DVT) and travels to the lungs. A clot is blood that has thickened into a gel or solid. PE is a dangerous and life-threatening condition that needs to be treated right away. What are the causes? This condition is usually caused by a blood clot that forms in a vein and moves to the lungs. In rare cases, it may be caused by air, fat, part of a tumor, or other tissue that moves through the veins and into the lungs. What increases the risk? The following factors may make you more likely to develop this condition:  Experiencing a traumatic injury, such as breaking a hip or leg.  Having: ? A spinal cord injury. ? Orthopedic surgery, especially hip or knee replacement. ? Any major surgery. ? A  stroke. ? DVT. ? Blood clots or blood clotting disease. ? Long-term (chronic) lung or heart disease. ? Cancer treated with chemotherapy. ? A central venous catheter.  Taking medicines that contain estrogen. These include birth control pills and hormone replacement therapy.  Being: ? Pregnant. ? In the period of time after your baby is delivered (postpartum). ? Older than age 55. ? Overweight. ? A smoker, especially if you have other risks. What are the signs or symptoms? Symptoms of this condition usually start suddenly and include:  Shortness of breath during activity or at rest.  Coughing, coughing up blood, or coughing up blood-tinged mucus.  Chest pain that is often worse with deep breaths.  Rapid or irregular heartbeat.  Feeling light-headed or dizzy.  Fainting.  Feeling anxious.  Fever.  Sweating.  Pain and swelling in a leg. This is a symptom of DVT, which can lead to PE. How is this diagnosed? This condition may be diagnosed based on:  Your medical history.  A physical exam.  Blood tests.  CT pulmonary angiogram. This test checks blood flow in and around your lungs.  Ventilation-perfusion scan, also called a lung VQ scan. This test measures air flow and blood flow to the lungs.  An ultrasound of the legs. How is this treated? Treatment for this condition depends on many factors, such as the cause of your PE, your risk for bleeding or developing more clots,  and other medical conditions you have. Treatment aims to remove, dissolve, or stop blood clots from forming or growing larger. Treatment may include:  Medicines, such as: ? Blood thinning medicines (anticoagulants) to stop clots from forming. ? Medicines that dissolve clots (thrombolytics).  Procedures, such as: ? Using a flexible tube to remove a blood clot (embolectomy) or to deliver medicine to destroy it (catheter-directed thrombolysis). ? Inserting a filter into a large vein that carries  blood to the heart (inferior vena cava). This filter (vena cava filter) catches blood clots before they reach the lungs. ? Surgery to remove the clot (surgical embolectomy). This is rare. You may need a combination of immediate, long-term (up to 3 months after diagnosis), and extended (more than 3 months after diagnosis) treatments. Your treatment may continue for several months (maintenance therapy). You and your health care provider will work together to choose the treatment program that is best for you. Follow these instructions at home: Medicines  Take over-the-counter and prescription medicines only as told by your health care provider.  If you are taking an anticoagulant medicine: ? Take the medicine every day at the same time each day. ? Understand what foods and drugs interact with your medicine. ? Understand the side effects of this medicine, including excessive bruising or bleeding. Ask your health care provider or pharmacist about other side effects. General instructions  Wear a medical alert bracelet or carry a medical alert card that says you have had a PE and lists what medicines you take.  Ask your health care provider when you may return to your normal activities. Avoid sitting or lying for a long time without moving.  Maintain a healthy weight. Ask your health care provider what weight is healthy for you.  Do not use any products that contain nicotine or tobacco, such as cigarettes, e-cigarettes, and chewing tobacco. If you need help quitting, ask your health care provider.  Talk with your health care provider about any travel plans. It is important to make sure that you are still able to take your medicine while on trips.  Keep all follow-up visits as told by your health care provider. This is important. Contact a health care provider if:  You missed a dose of your blood thinner medicine. Get help right away if:  You have: ? New or increased pain, swelling, warmth, or  redness in an arm or leg. ? Numbness or tingling in an arm or leg. ? Shortness of breath during activity or at rest. ? A fever. ? Chest pain. ? A rapid or irregular heartbeat. ? A severe headache. ? Vision changes. ? A serious fall or accident, or you hit your head. ? Stomach (abdominal) pain. ? Blood in your vomit, stool, or urine. ? A cut that will not stop bleeding.  You cough up blood.  You feel light-headed or dizzy.  You cannot move your arms or legs.  You are confused or have memory loss. These symptoms may represent a serious problem that is an emergency. Do not wait to see if the symptoms will go away. Get medical help right away. Call your local emergency services (911 in the [place] Do not drive yourself to the hospital. Summary  A pulmonary embolism (PE) is a sudden blockage or decrease of blood flow in one or both lungs. PE is a dangerous and life-threatening condition that needs to be treated right away.  Treatments for this condition usually include medicines to thin your blood (anticoagulants) or medicines to  break apart blood clots (thrombolytics).  If you are given blood thinners, it is important to take the medicine every day at the same time each day.  Understand what foods and drugs interact with any medicines that you are taking.  If you have signs of PE or DVT, call your local emergency services (911 in the [place] This information is not intended to replace advice given to you by your health care provider. Make sure you discuss any questions you have with your health care provider. Document Revised: 04/17/2018 Document Reviewed: 04/17/2018 Elsevier Patient Education  2020 [employer].

## 2019-09-24 NOTE — Progress Notes (Addendum)
Elizabeth Stanton 29 y.o.   Chief Complaint  Patient presents with  . pulmonary embolism    hospital follow up IP x 2 days    HISTORY OF PRESENT ILLNESS: This is a 29 y.o. female here for hospital discharge follow-up.  Status post pulmonary embolism. Patient was on birth control medication.  Work-up also disclosed protein S deficiency. Had a UTI.  Treated with IV ceftriaxone and 3 more days of oral cephalosporin.  Asymptomatic. Normal echocardiogram.  Normal Doppler ultrasounds of the lower extremities. CTA of chest reviewed with patient. Presently on Xarelto.  No bleeding issues. Asymptomatic.  Feeling better. 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   Physician Discharge Summary  Mardelle Pandolfi LPF:[id number] DOB: Dec 22, 1990 DOA: 09/18/2019  PCP: Georgina Quint, MD  Admit date: 09/18/2019 Discharge date: 09/19/2019  Admitted From: Home Disposition: Home  Recommendations for Outpatient Follow-up:  1. Follow up with PCP in 1-2 weeks 2. Please obtain BMP/CBC in one week 3. Please follow up on the following pending results: Urine culture.  Hypercoagulable labs.  Home Health: No Equipment/Devices: None Discharge Condition: Stable CODE STATUS: Full Diet recommendation: Regular   Brief/Interim Summary: Elizabeth Stanton a 29 y.o.femalewith medical history significant ofcerebral palsy, stroke, rightarm weakkness, who presents with SOB.  Found to have subsegmental PE.  Was on oral contraceptives which increases the risk of VTE.  Lower extremity venous Doppler were without any DVT.  Echocardiogram was within normal limit.  She was started on heparin infusion on admission and then transition to Xarelto for discharge. Hypercoagulable labs were drawn during hospitalization and results were pending on discharge. She was advised to discussed her contraceptive measures with her gynecologist and avoid oral contraceptive pills.     She will follow up with primary care physician for further management.  She was also found to have UTI with UA, having mild lower abdominal discomfort and increased urinary frequency and urgency.  Urine culture results were pending on discharge.  She was treated with ceftriaxone in the hospital and discharged on cefdinir for 3 days.  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 Authorizing Provider  Rivaroxaban 15 & 20 MG TBPK Follow package directions: Take one 15mg  tablet by mouth twice a day. On day 22, switch to one 20mg  tablet once a day. Take with food. 09/19/19  Yes , MD    Allergies  Allergen Reactions  . Lavender Oil Hives    Patient Active Problem List   Diagnosis Date Noted  . Acute pulmonary embolism (HCC) 09/18/2019  . Acute pulmonary embolus (HCC) 09/18/2019  . 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 Cerebral palsy (HCC)   . Right hemiplegia (HCC) 10/17/2017  . History of cerebral palsy     Past Medical History:  Diagnosis Date  . Cerebral palsy (HCC)   . Dysmenorrhea   . History of abnormal cervical Pap smear    + HPV  . History of cerebral palsy   . 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 [facility])     Past Surgical History:  Procedure Laterality Date  . COLPOSCOPY      Social History   Socioeconomic History  . Marital status:  Married    Spouse name: Not on file  . Number of children: Not on file  . Years of education: Not on file  . Highest education level: Not on file  Occupational History  . Not on file  Tobacco Use  . Smoking status: Never Smoker  . Smokeless tobacco: Never Used  Substance and Sexual Activity  . Alcohol use: Yes    Alcohol/week: 1.0 standard  drinks    Types: 1 Standard drinks or equivalent per week    Comment: wine  . Drug use: No  . Sexual activity: Yes    Partners: Male    Birth control/protection: Pill  Other Topics Concern  . Not on file  Social History Narrative  . Not on file   Social Determinants of Health   Financial Resource Strain:   . Difficulty of Paying Living Expenses: Not on file  Food Insecurity:   . Worried About Programme researcher, broadcasting/film/video in the Last Year: Not on file  . Ran Out of Food in the Last Year: Not on file  Transportation Needs:   . Lack of Transportation (Medical): Not on file  . Lack of Transportation (Non-Medical): Not on file  Physical Activity:   . Days of Exercise per Week: Not on file  . Minutes of Exercise per Session: Not on file  Stress:   . Feeling of Stress : Not on file  Social Connections:   . Frequency of Communication with Friends and Family: Not on file  . Frequency of Social Gatherings with Friends and Family: Not on file  . Attends Religious Services: Not on file  . Active Member of Clubs or Organizations: Not on file  . Attends Banker Meetings: Not on file  . Marital Status: Not on file  Intimate Partner Violence:   . Fear of Current or Ex-Partner: Not on file  . Emotionally Abused: Not on file  . Physically Abused: Not on file  . Sexually Abused: Not on file    Family History  Problem Relation Age of Onset  . Hypertension Mother   . Diabetes Mother   . Hypertension Maternal Grandmother   . Cancer Paternal Grandfather        skin     Review of Systems  Constitutional: Negative.  Negative for chills and fever.  HENT: Negative.  Negative for congestion and sore throat.   Eyes: Negative for blurred vision and double vision.  Respiratory: Negative.  Negative for cough and shortness of breath.   Cardiovascular: Negative.  Negative for chest pain and palpitations.  Gastrointestinal: Negative.  Negative for abdominal pain, blood in stool, diarrhea,  melena, nausea and vomiting.  Genitourinary: Negative.  Negative for dysuria and hematuria.  Musculoskeletal: Negative.   Skin: Negative.  Negative for rash.  Neurological: Negative for dizziness, sensory change, seizures, loss of consciousness and headaches.  Endo/Heme/Allergies: Does not bruise/bleed easily.  All other systems reviewed and are negative.  Today's Vitals   09/24/19 1008  BP: (!) 141/89  Pulse: 85  Resp: 16  Temp: 98.3 F (36.8 C)  TempSrc: Temporal  SpO2: 97%  Weight: 207 lb (93.9 kg)  Height: 5\' 5"  (1.651 m)   Body mass index is 34.45 kg/m.   Physical Exam Vitals reviewed.  Constitutional:      Appearance: Normal appearance.  HENT:     Head: Normocephalic and atraumatic.  Eyes:     Extraocular Movements: Extraocular movements intact.     Conjunctiva/sclera: Conjunctivae normal.  Pupils: Pupils are equal, round, and reactive to light.  Cardiovascular:     Rate and Rhythm: Normal rate and regular rhythm.     Pulses: Normal pulses.     Heart sounds: Normal heart sounds.  Pulmonary:     Effort: Pulmonary effort is normal.     Breath sounds: Normal breath sounds.  Musculoskeletal:     Cervical back: Normal range of motion and neck supple.  Skin:    General: Skin is warm and dry.     Comments: Healing abrasion on dorsal surface of right wrist.  No infection.  Neurological:     Mental Status: She is alert and oriented to person, place, and time. Mental status is at baseline.     Comments: Right arm hemiplegia with muscle atrophy  Psychiatric:        Mood and Affect: Mood normal.        Behavior: Behavior normal.     A total of 30 minutes was spent with the patient, greater than 50% of which was in counseling/coordination of care regarding pulmonary embolism and treatment including long-term anticoagulation therapy with Xarelto, review of all hospital notes, review of all blood results, review of imaging studies, anticoagulation precautions  regarding falls and bleeding problems, need for hematology evaluation regarding protein S deficiency, discussion about oral contraceptives and role in pathogenesis of blood clots, prognosis and need for follow-up.  ASSESSMENT & PLAN: Elizabeth Stanton was seen today for pulmonary embolism.  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)  Pulmonary embolism on long-term anticoagulation therapy (HCC) -     Ambulatory referral to Hematology  Protein S deficiency (HCC) -     Ambulatory referral to Hematology  Reason for Referral: Clotting disorder, protein S deficiency Referral discussed with patient: Yes Best contact number of patient for referral team: On the chart Has patient been seen by a specialist for this issue before: No Patient provider preference for referral: No preference Patient location preference for referral: Close to her home address     Patient Instructions       If you have lab work done today you will be contacted with your lab results within the next 2 weeks.  If you have not heard from [place] then please contact us. The fastest way to get your results is to register for My Chart.   IF you received an x-ray today, you will receive an invoice from [facility]. Please contact [facility] at [phone] with questions or concerns regarding your invoice.   IF you received labwork today, you will receive an invoice from [facility]. Please contact [facility] at [phone] with questions or concerns regarding your invoice.   Our billing staff will not be able to assist you with questions regarding bills from these companies.  You will be contacted with the lab results as soon as they are available. The fastest way to get  your results is to activate your My Chart account. Instructions are located on the last page of this paperwork. If you have not heard from [place] regarding the results in 2 weeks, please contact this office.     Pulmonary Embolism  A pulmonary embolism (PE) is a sudden blockage or decrease of blood flow in one or both  lungs. Most blockages come from a blood clot that forms in the vein of a lower leg, thigh, or arm (deep vein thrombosis, DVT) and travels to the lungs. A clot is blood that has thickened into a gel or solid. PE is a dangerous and life-threatening condition that needs to be treated right away. What are the causes? This condition is usually caused by a blood clot that forms in a vein and moves to the lungs. In rare cases, it may be caused by air, fat, part of a tumor, or other tissue that moves through the veins and into the lungs. What increases the risk? The following factors may make you more likely to develop this condition:  Experiencing a traumatic injury, such as breaking a hip or leg.  Having: ? A spinal cord injury. ? Orthopedic surgery, especially hip or knee replacement. ? Any major surgery. ? A stroke. ? DVT. ? Blood clots or blood clotting disease. ? Long-term (chronic) lung or heart disease. ? Cancer treated with chemotherapy. ? A central venous catheter.  Taking medicines that contain estrogen. These include birth control pills and hormone replacement therapy.  Being: ? Pregnant. ? In the period of time after your baby is delivered (postpartum). ? Older than age 49. ? Overweight. ? A smoker, especially if you have other risks. What are the signs or symptoms? Symptoms of this condition usually start suddenly and include:  Shortness of breath during activity or at rest.  Coughing, coughing up blood, or coughing up blood-tinged mucus.  Chest pain that is often worse with deep breaths.  Rapid or irregular heartbeat.  Feeling light-headed or  dizzy.  Fainting.  Feeling anxious.  Fever.  Sweating.  Pain and swelling in a leg. This is a symptom of DVT, which can lead to PE. How is this diagnosed? This condition may be diagnosed based on:  Your medical history.  A physical exam.  Blood tests.  CT pulmonary angiogram. This test checks blood flow in and around your lungs.  Ventilation-perfusion scan, also called a lung VQ scan. This test measures air flow and blood flow to the lungs.  An ultrasound of the legs. How is this treated? Treatment for this condition depends on many factors, such as the cause of your PE, your risk for bleeding or developing more clots, and other medical conditions you have. Treatment aims to remove, dissolve, or stop blood clots from forming or growing larger. Treatment may include:  Medicines, such as: ? Blood thinning medicines (anticoagulants) to stop clots from forming. ? Medicines that dissolve clots (thrombolytics).  Procedures, such as: ? Using a flexible tube to remove a blood clot (embolectomy) or to deliver medicine to destroy it (catheter-directed thrombolysis). ? Inserting a filter into a large vein that carries blood to the heart (inferior vena cava). This filter (vena cava filter) catches blood clots before they reach the lungs. ? Surgery to remove the clot (surgical embolectomy). This is rare. You may need a combination of immediate, long-term (up to 3 months after diagnosis), and extended (more than 3 months after diagnosis) treatments. Your treatment may continue for several months (maintenance therapy). You and your health care provider will work together to choose the treatment program that is best for you. Follow these instructions at home: Medicines  Take over-the-counter and prescription medicines only as told by your health care provider.  If you are taking an anticoagulant medicine: ? Take the medicine every day at the same time each day. ? Understand what  foods and  drugs interact with your medicine. ? Understand the side effects of this medicine, including excessive bruising or bleeding. Ask your health care provider or pharmacist about other side effects. General instructions  Wear a medical alert bracelet or carry a medical alert card that says you have had a PE and lists what medicines you take.  Ask your health care provider when you may return to your normal activities. Avoid sitting or lying for a long time without moving.  Maintain a healthy weight. Ask your health care provider what weight is healthy for you.  Do not use any products that contain nicotine or tobacco, such as cigarettes, e-cigarettes, and chewing tobacco. If you need help quitting, ask your health care provider.  Talk with your health care provider about any travel plans. It is important to make sure that you are still able to take your medicine while on trips.  Keep all follow-up visits as told by your health care provider. This is important. Contact a health care provider if:  You missed a dose of your blood thinner medicine. Get help right away if:  You have: ? New or increased pain, swelling, warmth, or redness in an arm or leg. ? Numbness or tingling in an arm or leg. ? Shortness of breath during activity or at rest. ? A fever. ? Chest pain. ? A rapid or irregular heartbeat. ? A severe headache. ? Vision changes. ? A serious fall or accident, or you hit your head. ? Stomach (abdominal) pain. ? Blood in your vomit, stool, or urine. ? A cut that will not stop bleeding.  You cough up blood.  You feel light-headed or dizzy.  You cannot move your arms or legs.  You are confused or have memory loss. These symptoms may represent a serious problem that is an emergency. Do not wait to see if the symptoms will go away. Get medical help right away. Call your local emergency services (911 in the [place] Do not drive yourself to the hospital. Summary  A pulmonary  embolism (PE) is a sudden blockage or decrease of blood flow in one or both lungs. PE is a dangerous and life-threatening condition that needs to be treated right away.  Treatments for this condition usually include medicines to thin your blood (anticoagulants) or medicines to break apart blood clots (thrombolytics).  If you are given blood thinners, it is important to take the medicine every day at the same time each day.  Understand what foods and drugs interact with any medicines that you are taking.  If you have signs of PE or DVT, call your local emergency services (911 in the [place] This information is not intended to replace advice given to you by your health care provider. Make sure you discuss any questions you have with your health care provider. Document Revised: 04/17/2018 Document Reviewed: 04/17/2018 Elsevier Patient Education  2020 [employer]      Edwina Barth, MD [facility]

## 2019-09-25 ENCOUNTER — Telehealth: Payer: Self-pay | Admitting: Oncology

## 2019-09-25 NOTE — Telephone Encounter (Signed)
Received a new pt referral from Dr. Alvy Bimler for pe/protein s deficiency. [name] has been cld and scheduled to see Dr. Clelia Croft on 3/10 at 11am. Pt aware to arrive 15 minutes early.

## 2019-10-01 ENCOUNTER — Inpatient Hospital Stay: Payer: BC Managed Care – PPO | Attending: Oncology | Admitting: Oncology

## 2019-10-01 ENCOUNTER — Other Ambulatory Visit: Payer: Self-pay

## 2019-10-01 VITALS — BP 138/98 | HR 98 | Temp 98.3°F | Resp 17 | Ht 65.0 in | Wt 208.0 lb

## 2019-10-01 DIAGNOSIS — I2699 Other pulmonary embolism without acute cor pulmonale: Secondary | ICD-10-CM | POA: Diagnosis not present

## 2019-10-01 NOTE — Progress Notes (Signed)
Reason for the request:    Pulmonary embolism  HPI: I was asked by Dr. Alvy Bimler to evaluate Elizabeth Stanton for the evaluation of pulmonary.  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  She presented with symptoms of shortness of breath on September 18, 2023.  Leading up to the presentation she has sustained a fall and was immobile for a few days.  CT scan of the chest showed a subsegmental pulmonary embolism in the peripheral lateral basal segment of the left lower lobe.  She was started on heparin and was discharged on 26 February on Eliquis.  Vascular ultrasound did not show any evidence of deep vein thrombosis in her lower extremities.  Hypercoagulable work-up obtained on February 25 and showed no evidence of antiphospholipid antibodies, normal lupus anticoagulant, normal protein CT total and activity level and negative factor V Leiden mutation as well as prothrombin gene mutation.  Her protein S activity was at 62% slightly lower than normal at 63%.  Protein S total was 73.  She was on oral contraceptives on presentation and currently off of it.  She denies any previous thrombosis or bleeding episodes.  Her symptoms have resolved at this time and have resumed all work-related activities.  She does not report any headaches, blurry vision, syncope or seizures. Does not report any fevers, chills or sweats.  Does not report any cough, wheezing or hemoptysis.  Does not report any chest pain, palpitation, orthopnea or leg edema.  Does not report any nausea, vomiting or abdominal pain.  Does not report any constipation or diarrhea.  Does not report any skeletal complaints.    Does not report frequency, urgency or hematuria.  Does not report any skin rashes or lesions. Does not report any heat or cold intolerance.  Does not report any lymphadenopathy or  petechiae.  Does not report any anxiety or depression.  Remaining review of systems is negative.    Past Medical History:  Diagnosis Date  . Cerebral palsy (HCC)   . Dysmenorrhea   . History of abnormal cervical Pap smear    + HPV  . History of cerebral palsy   . 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 COLPOSCOPY    :   Current Outpatient Medications:  .  Rivaroxaban 15 & 20 MG TBPK, Follow package directions: Take one 15mg  tablet by mouth twice a day. On day 22, switch to one 20mg  tablet once a day. Take with food., Disp: 51 each, Rfl: 0:  Allergies  Allergen Reactions  . Lavender Oil Hives  :  Family History  Problem Relation Age of Onset  . Hypertension Mother   . Diabetes Mother   . Hypertension Maternal Grandmother   . Cancer Paternal Grandfather        skin  :  Social History   Socioeconomic History  . Marital status: Married    Spouse name: Not on file  . Number of children: Not on file  . Years of education: Not on file  . Highest education level: Not on file  Occupational History  . Not on file  Tobacco Use  . Smoking status: Never Smoker  . Smokeless tobacco: Never Used  Substance and Sexual Activity  . Alcohol use: Yes  Alcohol/week: 1.0 standard drinks    Types: 1 Standard drinks or equivalent per week    Comment: wine  . Drug use: No  . Sexual activity: Yes    Partners: Male    Birth control/protection: Pill  Other Topics Concern  . Not on file  Social History Narrative  . Not on file   Social Determinants of Health   Financial Resource Strain:   . Difficulty of Paying Living Expenses: Not on file  Food Insecurity:   . Worried About Programme researcher, broadcasting/film/video in the Last Year: Not on file  . Ran Out of Food in the Last Year: Not on file  Transportation Needs:   . Lack of Transportation (Medical): Not on file  . Lack of Transportation (Non-Medical): Not on file   Physical Activity:   . Days of Exercise per Week: Not on file  . Minutes of Exercise per Session: Not on file  Stress:   . Feeling of Stress : Not on file  Social Connections:   . Frequency of Communication with Friends and Family: Not on file  . Frequency of Social Gatherings with Friends and Family: Not on file  . Attends Religious Services: Not on file  . Active Member of Clubs or Organizations: Not on file  . Attends Banker Meetings: Not on file  . Marital Status: Not on file  Intimate Partner Violence:   . Fear of Current or Ex-Partner: Not on file  . Emotionally Abused: Not on file  . Physically Abused: Not on file  . Sexually Abused: Not on file  :  Pertinent items are noted in HPI.  Exam: Blood pressure (!) 138/98, pulse 98, temperature 98.3 F (36.8 C), temperature source Temporal, resp. rate 17, height 5\' 5"  (1.651 m), weight 208 lb (94.3 kg), last menstrual period 09/18/2019, SpO2 100 %.  ECOG 0 General appearance: alert and cooperative appeared without distress. Head: atraumatic without any abnormalities. Eyes: conjunctivae/corneas clear. PERRL.  Sclera anicteric. Throat: lips, mucosa, and tongue normal; without oral thrush or ulcers. Resp: clear to auscultation bilaterally without rhonchi, wheezes or dullness to percussion. Cardio: regular rate and rhythm, S1, S2 normal, no murmur, click, rub or gallop GI: soft, non-tender; bowel sounds normal; no masses,  no organomegaly Skin: Skin color, texture, turgor normal. No rashes or lesions Lymph nodes: Cervical, supraclavicular, and axillary nodes normal. Neurologic: Slight weakness noted in her right arm which is chronic. Musculoskeletal: Contraction noted in her right hand.  CBC    Component Value Date/Time   WBC 6.6 09/24/2019 1112   WBC 10.0 09/19/2019 0209   RBC 4.62 09/24/2019 1112   RBC 3.97 09/19/2019 0209   HGB 13.8 09/24/2019 1112   HCT 41.8 09/24/2019 1112   PLT 521 (H) 09/24/2019 1112    MCV 91 09/24/2019 1112   MCH 29.9 09/24/2019 1112   MCH 30.5 09/19/2019 0209   MCHC 33.0 09/24/2019 1112   MCHC 32.8 09/19/2019 0209   RDW 11.5 (L) 09/24/2019 1112   LYMPHSABS 2.5 09/24/2019 1112   MONOABS 1.0 09/18/2019 0909   EOSABS 0.1 09/24/2019 1112   BASOSABS 0.0 09/24/2019 1112     Chemistry      Component Value Date/Time   NA 139 09/24/2019 1112   K 4.4 09/24/2019 1112   CL 104 09/24/2019 1112   CO2 21 09/24/2019 1112   BUN 9 09/24/2019 1112   CREATININE 0.73 09/24/2019 1112      Component Value Date/Time   CALCIUM 9.8 09/24/2019  1112   ALKPHOS 50 09/24/2019 1112   AST 15 09/24/2019 1112   ALT 17 09/24/2019 1112   BILITOT <0.2 09/24/2019 1112          CT Angio Chest PE W and/or Wo Contrast  Result Date: 09/18/2019 CLINICAL DATA:  Hervey Ard back pain and shortness of breath since last night. No fever or cough. EXAM: CT ANGIOGRAPHY CHEST WITH CONTRAST TECHNIQUE: Multidetector CT imaging of the chest was performed using the standard protocol during bolus administration of intravenous contrast. Multiplanar CT image reconstructions and MIPs were obtained to evaluate the vascular anatomy. CONTRAST:  6mL OMNIPAQUE IOHEXOL 350 MG/ML SOLN COMPARISON:  None. FINDINGS: Cardiovascular: Satisfactory opacification of the pulmonary arteries to the segmental level. Suspected subsegmental pulmonary embolism in the peripheral lateral basal segment of the left lower lobe (series 9, images 47). Normal heart size. No pericardial effusion. No thoracic aortic aneurysm or dissection. Mediastinum/Nodes: No enlarged mediastinal, hilar, or axillary lymph nodes. Thyroid gland, trachea, and esophagus demonstrate no significant findings. Lungs/Pleura: Trace left pleural effusion. Subsegmental atelectasis in the lingula and left greater than right lower lobes. No consolidation or pneumothorax. 6 mm subpleural nodule in the right lower lobe (series 6, image 41). Upper Abdomen: No acute abnormality.  Musculoskeletal: No chest wall abnormality. No acute or significant osseous findings. Review of the MIP images confirms the above findings. IMPRESSION: 1. Suspected subsegmental pulmonary embolism in the peripheral lateral basal segment of the left lower lobe. 2. Trace left pleural effusion. 3. 6 mm subpleural nodule in the right lower lobe, almost certainly benign. Please note that Fleischner criteria do not apply to patients younger than 35. Electronically Signed   By: Titus Dubin M.D.   On: 09/18/2019 11:31   US Venous Img Lower Bilateral (DVT)  Result Date: 09/19/2019 CLINICAL DATA:  Pulmonary emboli EXAM: BILATERAL LOWER EXTREMITY VENOUS DUPLEX ULTRASOUND TECHNIQUE: Gray-scale sonography with graded compression, as well as color Doppler and duplex ultrasound were performed to evaluate the lower extremity deep venous systems from the level of the common femoral vein and including the common femoral, femoral, profunda femoral, popliteal and calf veins including the posterior tibial, peroneal and gastrocnemius veins when visible. The superficial great saphenous vein was also interrogated. Spectral Doppler was utilized to evaluate flow at rest and with distal augmentation maneuvers in the common femoral, femoral and popliteal veins. COMPARISON:  None. FINDINGS: RIGHT LOWER EXTREMITY Common Femoral Vein: No evidence of thrombus. Normal compressibility, respiratory phasicity and response to augmentation. Saphenofemoral Junction: No evidence of thrombus. Normal compressibility and flow on color Doppler imaging. Profunda Femoral Vein: No evidence of thrombus. Normal compressibility and flow on color Doppler imaging. Femoral Vein: No evidence of thrombus. Normal compressibility, respiratory phasicity and response to augmentation. Popliteal Vein: No evidence of thrombus. Normal compressibility, respiratory phasicity and response to augmentation. Calf Veins: No evidence of thrombus. Normal compressibility and flow  on color Doppler imaging. Superficial Great Saphenous Vein: No evidence of thrombus. Normal compressibility. Venous Reflux:  None. Other Findings:  None. LEFT LOWER EXTREMITY Common Femoral Vein: No evidence of thrombus. Normal compressibility, respiratory phasicity and response to augmentation. Saphenofemoral Junction: No evidence of thrombus. Normal compressibility and flow on color Doppler imaging. Profunda Femoral Vein: No evidence of thrombus. Normal compressibility and flow on color Doppler imaging. Femoral Vein: No evidence of thrombus. Normal compressibility, respiratory phasicity and response to augmentation. Popliteal Vein: No evidence of thrombus. Normal compressibility, respiratory phasicity and response to augmentation. Calf Veins: No evidence of thrombus. Normal compressibility and flow  on color Doppler imaging. Superficial Great Saphenous Vein: No evidence of thrombus. Normal compressibility. Venous Reflux:  None. Other Findings:  None. IMPRESSION: No evidence of deep venous thrombosis in either lower extremity. Electronically Signed   By: Bretta Bang III M.D.   On: 09/19/2019 08:59     Assessment and Plan:    29 year old woman with:  1.  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  He presented with small subsegmental PE and symptoms of shortness of breath in the setting of immobility as well as oral contraception.  Hypercoagulable work-up did show a 62% protein S functional activity with the normal level of 63%.  Otherwise normal hypercoagulable work-up was noted.  The natural course of thromboembolism was discussed.  The role of inherited thrombophilia was explained and discussed today in details.  Her low protein S functional activity is rather mild I am not quite sure how clinically relevant or contributory at this time.  It is possible that she could have a mild form of S deficiency which in conjunction with immobility, all contraception resulted in her recent  thrombosis.  From a management standpoint, I have recommended a 80-month course of anticoagulation using Xarelto.  Upon completing her 6 months of anticoagulation, I recommended repeating protein S levels at that time off anticoagulation and in steady state to determine whether the protein S decline was related to her recent thrombosis or clinically relevant.  I do not recommend a longer term anticoagulation at this time but I do recommend avoiding hormone based contraception for the time being.  Upon completing her anticoagulation course, will discuss strategies to combat the recurrent thromboembolism.  We also discussed potential pregnancy in the future which could increase her risk of thrombosis pending the results of her repeat hypercoagulable work-up.  2.  Follow-up: will be in 6 months for repeat evaluation and final recommendations.  60  minutes were dedicated to this visit. The time was spent on reviewing laboratory data, imaging studies, discussing treatment options,  and answering questions regarding future plan.    A copy of this consult has been forwarded to the requesting physician.

## 2019-10-02 ENCOUNTER — Telehealth: Payer: Self-pay | Admitting: Oncology

## 2019-10-02 NOTE — Telephone Encounter (Signed)
Scheduled appt per 3/10 los.  Sent a message to HIM pool to get a calendar mailed out. 

## 2019-10-10 ENCOUNTER — Encounter: Payer: Self-pay | Admitting: Emergency Medicine

## 2019-10-10 NOTE — Telephone Encounter (Signed)
Patient is requesting a refill of the following medications:  Xarelto  Date of patient request: 10/10/2019 Last office visit: 09/24/2019 Date of last refill: 09/19/2019 Last refill amount: 51

## 2019-10-11 ENCOUNTER — Other Ambulatory Visit: Payer: Self-pay | Admitting: Emergency Medicine

## 2019-10-11 MED ORDER — RIVAROXABAN 20 MG PO TABS: 20.0000 mg | ORAL_TABLET | Freq: Every day | ORAL | 1 refills | Status: DC

## 2019-10-11 NOTE — Telephone Encounter (Signed)
Refilled. Thanks!

## 2019-10-13 ENCOUNTER — Ambulatory Visit: Payer: BC Managed Care – PPO | Admitting: Obstetrics and Gynecology

## 2019-10-15 NOTE — Progress Notes (Deleted)
29 y.o. G0P0000 Married Black or [place] American Not Hispanic or Latino female here for annual exam.      Patient's last menstrual period was 09/18/2019.          Sexually active: {yes no:314532}  The current method of family planning is {contraception:315051}.    Exercising: {yes no:314532}  {types:19826} Smoker:  {YES [id number]  Health Maintenance: Pap:  09/21/2017 WNL, 2017 WNL per patient History of abnormal Pap:  Yes 2016 + HR HPV - had colposcopy  TDaP:  09/20/2017 Gardasil: Completed    reports that she has never smoked. She has never used smokeless tobacco. She reports current alcohol use of about 1.0 standard drinks of alcohol per week. She reports that she does not use drugs.  Past Medical History:  Diagnosis Date  . Cerebral palsy (HCC)   . Dysmenorrhea   . History of abnormal cervical Pap smear    + HPV  . History of cerebral palsy   . 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 COLPOSCOPY      Current Outpatient Medications  Medication Sig Dispense Refill  . rivaroxaban (XARELTO) 20 MG TABS tablet Take 1 tablet (20 mg total) by mouth daily with supper. 90 tablet 1   No current facility-administered medications for this visit.    Family History  Problem Relation Age of Onset  . Hypertension Mother   . Diabetes Mother   . Hypertension Maternal Grandmother   . Cancer Paternal Grandfather        skin    Review of Systems  Exam:   LMP 09/18/2019 Comment: neg upt  Weight change: @WEIGHTCHANGE @ Height:      Ht Readings from Last 3 Encounters:  10/01/19 5\' 5"  (1.651 m)  09/24/19 5\' 5"  (1.651 m)  09/18/19 5\' 5"  (1.651 m)    General appearance: alert, cooperative and appears stated age Head: Normocephalic, without obvious abnormality, atraumatic Neck: no adenopathy, supple, symmetrical, trachea midline and thyroid {CHL AMB PHY EX THYROID NORM DEFAULT:[id number]::"normal to inspection and  palpation"} Lungs: clear to auscultation bilaterally Cardiovascular: regular rate and rhythm Breasts: {Exam; breast:13139::"normal appearance, no masses or tenderness"} Abdomen: soft, non-tender; non distended,  no masses,  no organomegaly Extremities: extremities normal, atraumatic, no cyanosis or edema Skin: Skin color, texture, turgor normal. No rashes or lesions Lymph nodes: Cervical, supraclavicular, and axillary nodes normal. No abnormal inguinal nodes palpated Neurologic: Grossly normal   Pelvic: External genitalia:  no lesions              Urethra:  normal appearing urethra with no masses, tenderness or lesions              Bartholins and Skenes: normal                 Vagina: normal appearing vagina with normal color and discharge, no lesions              Cervix: {CHL AMB PHY EX CERVIX NORM DEFAULT:[phone]::"no lesions"}               Bimanual Exam:  Uterus:  {CHL AMB PHY EX UTERUS NORM [phone]::"normal size, contour, position, consistency, mobility, non-tender"}              Adnexa: {CHL AMB PHY EX ADNEXA NO MASS DEFAULT:[phone]::"no mass, fullness, tenderness"}               Rectovaginal: Confirms  Anus:  normal sphincter tone, no lesions  *** chaperoned for the exam.  A:  Well Woman with normal exam  P:

## 2019-10-16 ENCOUNTER — Ambulatory Visit: Payer: BC Managed Care – PPO | Admitting: Obstetrics and Gynecology

## 2019-10-27 NOTE — Progress Notes (Signed)
29 y.o. G0P0000 Married Black or [place] American Not Hispanic or Latino female here for annual exam.  Patient states that she had a blood clot in lung 09/18/19 She has a protein S deficiency. She is no long taking birth control due to this.   She doesn't want to have children. Since being off of OCP's they have avoiding intercourse.  Her cycles off of OCP's are very heavy, cramps have been bad.  Period Cycle (Days): 28 Period Duration (Days): 4-5 Period Pattern: Regular Menstrual Flow: Heavy Menstrual Control: Thin pad, Tampon Menstrual Control Change Freq (Hours): 1-2 Dysmenorrhea: (!) Moderate Dysmenorrhea Symptoms: Cramping, Nausea, Diarrhea, Headache Patient's last menstrual period was 10/16/2019 (exact date).          Sexually active: Yes.    The current method of family planning is none.    Exercising: No.  The patient does not participate in regular exercise at present. Smoker:  no  Health Maintenance: Pap:  09/21/2017 WNL, 2017 WNL per patient History of abnormal Pap:  no TDaP:  08/31/17  Gardasil: Completed series   reports that she has never smoked. She has never used smokeless tobacco. She reports current alcohol use of about 1.0 standard drinks of alcohol per week. She reports that she does not use drugs. 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 She has her masters degree in curriculum and instruction.   Past Medical History:  Diagnosis Date  . Cerebral palsy ([place])   . Dysmenorrhea   . History of abnormal cervical Pap smear    + HPV  . History of cerebral palsy   . 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 COLPOSCOPY      Current Outpatient Medications  Medication Sig Dispense Refill  . rivaroxaban (XARELTO) 20 MG TABS tablet Take 1 tablet (20 mg total) by mouth daily with supper. 90 tablet 1   No current facility-administered medications for this visit.    Family  History  Problem Relation Age of Onset  . Hypertension Mother   . Diabetes Mother   . Hypertension Maternal Grandmother   . Cancer Paternal Grandfather        skin    Review of Systems  Constitutional: Positive for fatigue.  HENT: Negative.   Eyes: Negative.   Respiratory: Positive for shortness of breath.   Cardiovascular: Positive for chest pain.  Endocrine: Negative.   Genitourinary: Negative.   Musculoskeletal: Negative.   Skin: Negative.   Neurological: Negative.   Hematological: Negative.   Psychiatric/Behavioral: Negative.     Exam:   Pulse 71   Temp 97.9 F (36.6 C)   Ht 5' 5.5" (1.664 m)   Wt 209 lb (94.8 kg)   LMP 10/16/2019 (Exact Date)   SpO2 97%   BMI 34.25 kg/m   Weight change: @WEIGHTCHANGE @ Height:   Height: 5' 5.5" (166.4 cm)  Ht Readings from Last 3 Encounters:  10/29/19 5' 5.5" (1.664 m)  10/01/19 5\' 5"  (1.651 m)  09/24/19 5\' 5"  (1.651 m)    General appearance: alert, cooperative and appears stated age Head: Normocephalic, without obvious abnormality, atraumatic Neck: no adenopathy, supple, symmetrical, trachea midline and thyroid normal to inspection and palpation Lungs: clear to auscultation bilaterally Cardiovascular: regular rate and rhythm Breasts: normal appearance, no masses or tenderness Abdomen: soft, non-tender; non distended,  no masses,  no organomegaly Extremities: extremities normal, atraumatic, no cyanosis or edema Skin:  Skin color, texture, turgor normal. No rashes or lesions Lymph nodes: Cervical, supraclavicular, and axillary nodes normal. No abnormal inguinal nodes palpated Neurologic: Grossly normal   Pelvic: External genitalia:  no lesions              Urethra:  normal appearing urethra with no masses, tenderness or lesions              Bartholins and Skenes: normal                 Vagina: normal appearing vagina with normal color and discharge, no lesions              Cervix: no lesions               Bimanual Exam:   Uterus:  normal size, contour, position, consistency, mobility, non-tender and anteverted              Adnexa: no mass, fullness, tenderness               Rectovaginal: Confirms               Anus:  normal sphincter tone, no lesions  Carolynn Serve chaperoned for the exam.  A:  Well Woman with normal exam  H/O PE on OCP's last year  Protein S def  BMI 34, weight gain  FH DM  P:   No pap this year  Information on the mirena IUD given, given information from the CDC about use of the mirena with h/o PE (benefit outweighs risk)  Lipids, HgbA1C (other labs UTD)  Information on breast self exam and calcium/vit d given

## 2019-10-28 ENCOUNTER — Other Ambulatory Visit: Payer: Self-pay

## 2019-10-29 ENCOUNTER — Ambulatory Visit: Payer: BC Managed Care – PPO | Admitting: Obstetrics and Gynecology

## 2019-10-29 ENCOUNTER — Encounter: Payer: Self-pay | Admitting: Obstetrics and Gynecology

## 2019-10-29 VITALS — BP 122/66 | HR 71 | Temp 97.9°F | Ht 65.5 in | Wt 209.0 lb

## 2019-10-29 DIAGNOSIS — Z6834 Body mass index (BMI) 34.0-34.9, adult: Secondary | ICD-10-CM

## 2019-10-29 DIAGNOSIS — Z8669 Personal history of other diseases of the nervous system and sense organs: Secondary | ICD-10-CM

## 2019-10-29 DIAGNOSIS — I2699 Other pulmonary embolism without acute cor pulmonale: Secondary | ICD-10-CM | POA: Diagnosis not present

## 2019-10-29 DIAGNOSIS — G8191 Hemiplegia, unspecified affecting right dominant side: Secondary | ICD-10-CM

## 2019-10-29 DIAGNOSIS — D6859 Other primary thrombophilia: Secondary | ICD-10-CM | POA: Diagnosis not present

## 2019-10-29 DIAGNOSIS — T45515A Adverse effect of anticoagulants, initial encounter: Secondary | ICD-10-CM

## 2019-10-29 DIAGNOSIS — Z01419 Encounter for gynecological examination (general) (routine) without abnormal findings: Secondary | ICD-10-CM

## 2019-10-29 DIAGNOSIS — Z833 Family history of diabetes mellitus: Secondary | ICD-10-CM

## 2019-10-29 NOTE — Patient Instructions (Addendum)
EXERCISE AND DIET:  We recommended that you start or continue a regular exercise program for good health. Regular exercise means any activity that makes your heart beat faster and makes you sweat.  We recommend exercising at least 30 minutes per day at least 3 days a week, preferably 4 or 5.  We also recommend a diet low in fat and sugar.  Inactivity, poor dietary choices and obesity can cause diabetes, heart attack, stroke, and kidney damage, among others.    ALCOHOL AND SMOKING:  Women should limit their alcohol intake to no more than 7 drinks/beers/glasses of wine (combined, not each!) per week. Moderation of alcohol intake to this level decreases your risk of breast cancer and liver damage. And of course, no recreational drugs are part of a healthy lifestyle.  And absolutely no smoking or even second hand smoke. Most people know smoking can cause heart and lung diseases, but did you know it also contributes to weakening of your bones? Aging of your skin?  Yellowing of your teeth and nails?  CALCIUM AND VITAMIN D:  Adequate intake of calcium and Vitamin D are recommended.  The recommendations for exact amounts of these supplements seem to change often, but generally speaking 1,000 mg of calcium (between diet and supplement) and 800 units of Vitamin D per day seems prudent. Certain women may benefit from higher intake of Vitamin D.  If you are among these women, your doctor will have told you during your visit.    PAP SMEARS:  Pap smears, to check for cervical cancer or precancers,  have traditionally been done yearly, although recent scientific advances have shown that most women can have pap smears less often.  However, every woman still should have a physical exam from her gynecologist every year. It will include a breast check, inspection of the vulva and vagina to check for abnormal growths or skin changes, a visual exam of the cervix, and then an exam to evaluate the size and shape of the uterus and  ovaries.  And after 29 years of age, a rectal exam is indicated to check for rectal cancers. We will also provide age appropriate advice regarding health maintenance, like when you should have certain vaccines, screening for sexually transmitted diseases, bone density testing, colonoscopy, mammograms, etc.   MAMMOGRAMS:  All women over 40 years old should have a yearly mammogram. Many facilities now offer a "3D" mammogram, which may cost around $50 extra out of pocket. If possible,  we recommend you accept the option to have the 3D mammogram performed.  It both reduces the number of women who will be called back for extra views which then turn out to be normal, and it is better than the routine mammogram at detecting truly abnormal areas.    COLON CANCER SCREENING: Now recommend starting at age 45. At this time colonoscopy is not covered for routine screening until 50. There are take home tests that can be done between 45-49.   COLONOSCOPY:  Colonoscopy to screen for colon cancer is recommended for all women at age 50.  We know, you hate the idea of the prep.  We agree, BUT, having colon cancer and not knowing it is worse!!  Colon cancer so often starts as a polyp that can be seen and removed at colonscopy, which can quite literally save your life!  And if your first colonoscopy is normal and you have no family history of colon cancer, most women don't have to have it again for   10 years.  Once every ten years, you can do something that may end up saving your life, right?  We will be happy to help you get it scheduled when you are ready.  Be sure to check your insurance coverage so you understand how much it will cost.  It may be covered as a preventative service at no cost, but you should check your particular policy.      Breast Self-Awareness Breast self-awareness means being familiar with how your breasts look and feel. It involves checking your breasts regularly and reporting any changes to your  health care provider. Practicing breast self-awareness is important. A change in your breasts can be a sign of a serious medical problem. Being familiar with how your breasts look and feel allows you to find any problems early, when treatment is more likely to be successful. All women should practice breast self-awareness, including women who have had breast implants. How to do a breast self-exam One way to learn what is normal for your breasts and whether your breasts are changing is to do a breast self-exam. To do a breast self-exam: Look for Changes  1. Remove all the clothing above your waist. 2. Stand in front of a mirror in a room with good lighting. 3. Put your hands on your hips. 4. Push your hands firmly downward. 5. Compare your breasts in the mirror. Look for differences between them (asymmetry), such as: ? Differences in shape. ? Differences in size. ? Puckers, dips, and bumps in one breast and not the other. 6. Look at each breast for changes in your skin, such as: ? Redness. ? Scaly areas. 7. Look for changes in your nipples, such as: ? Discharge. ? Bleeding. ? Dimpling. ? Redness. ? A change in position. Feel for Changes Carefully feel your breasts for lumps and changes. It is best to do this while lying on your back on the floor and again while sitting or standing in the shower or tub with soapy water on your skin. Feel each breast in the following way:  Place the arm on the side of the breast you are examining above your head.  Feel your breast with the other hand.  Start in the nipple area and make  inch (2 cm) overlapping circles to feel your breast. Use the pads of your three middle fingers to do this. Apply light pressure, then medium pressure, then firm pressure. The light pressure will allow you to feel the tissue closest to the skin. The medium pressure will allow you to feel the tissue that is a little deeper. The firm pressure will allow you to feel the tissue  close to the ribs.  Continue the overlapping circles, moving downward over the breast until you feel your ribs below your breast.  Move one finger-width toward the center of the body. Continue to use the  inch (2 cm) overlapping circles to feel your breast as you move slowly up toward your collarbone.  Continue the up and down exam using all three pressures until you reach your armpit.  Write Down What You Find  Write down what is normal for each breast and any changes that you find. Keep a written record with breast changes or normal findings for each breast. By writing this information down, you do not need to depend only on memory for size, tenderness, or location. Write down where you are in your menstrual cycle, if you are still menstruating. If you are having trouble noticing differences   in your breasts, do not get discouraged. With time you will become more familiar with the variations in your breasts and more comfortable with the exam. How often should I examine my breasts? Examine your breasts every month. If you are breastfeeding, the best time to examine your breasts is after a feeding or after using a breast pump. If you menstruate, the best time to examine your breasts is 5-7 days after your period is over. During your period, your breasts are lumpier, and it may be more difficult to notice changes. When should I see my health care provider? See your health care provider if you notice:  A change in shape or size of your breasts or nipples.  A change in the skin of your breast or nipples, such as a reddened or scaly area.  Unusual discharge from your nipples.  A lump or thick area that was not there before.  Pain in your breasts.  Anything that concerns you.   Counselor Roosevelt Locks [phone]

## 2019-10-30 LAB — LIPID PANEL
Chol/HDL Ratio: 2.6 ratio (ref 0.0–4.4)
Cholesterol, Total: 150 mg/dL (ref 100–199)
HDL: 58 mg/dL (ref >39)
LDL Chol Calc (NIH): 78 mg/dL (ref 0–99)
Triglycerides: 74 mg/dL (ref 0–149)
VLDL Cholesterol Cal: 14 mg/dL (ref 5–40)

## 2019-10-30 LAB — HEMOGLOBIN A1C
Est. average glucose Bld gHb Est-mCnc: 103 mg/dL
Hgb A1c MFr Bld: 5.2 % (ref 4.8–5.6)

## 2019-11-12 ENCOUNTER — Encounter: Payer: BC Managed Care – PPO | Admitting: Emergency Medicine

## 2019-12-01 ENCOUNTER — Ambulatory Visit (INDEPENDENT_AMBULATORY_CARE_PROVIDER_SITE_OTHER): Payer: BC Managed Care – PPO | Admitting: Emergency Medicine

## 2019-12-01 ENCOUNTER — Encounter: Payer: Self-pay | Admitting: Emergency Medicine

## 2019-12-01 ENCOUNTER — Other Ambulatory Visit: Payer: Self-pay

## 2019-12-01 VITALS — BP 128/86 | HR 89 | Temp 98.6°F | Ht 64.0 in | Wt 210.2 lb

## 2019-12-01 DIAGNOSIS — Z7901 Long term (current) use of anticoagulants: Secondary | ICD-10-CM

## 2019-12-01 DIAGNOSIS — Z0001 Encounter for general adult medical examination with abnormal findings: Secondary | ICD-10-CM | POA: Diagnosis not present

## 2019-12-01 DIAGNOSIS — G8191 Hemiplegia, unspecified affecting right dominant side: Secondary | ICD-10-CM | POA: Diagnosis not present

## 2019-12-01 DIAGNOSIS — Z0000 Encounter for general adult medical examination without abnormal findings: Secondary | ICD-10-CM

## 2019-12-01 DIAGNOSIS — D6859 Other primary thrombophilia: Secondary | ICD-10-CM

## 2019-12-01 DIAGNOSIS — Z86711 Personal history of pulmonary embolism: Secondary | ICD-10-CM

## 2019-12-01 DIAGNOSIS — Z8669 Personal history of other diseases of the nervous system and sense organs: Secondary | ICD-10-CM

## 2019-12-01 NOTE — Patient Instructions (Addendum)
   If you have lab work done today you will be contacted with your lab results within the next 2 weeks.  If you have not heard from us then please contact us. The fastest way to get your results is to register for My Chart.   IF you received an x-ray today, you will receive an invoice from [facility]. Please contact [facility] at [phone] with questions or concerns regarding your invoice.   IF you received labwork today, you will receive an invoice from [facility]. Please contact [facility] at [phone] with questions or concerns regarding your invoice.   Our billing staff will not be able to assist you with questions regarding bills from these companies.  You will be contacted with the lab results as soon as they are available. The fastest way to get your results is to activate your My Chart account. Instructions are located on the last page of this paperwork. If you have not heard from us regarding the results in 2 weeks, please contact this office.      Health Maintenance, Female Adopting a healthy lifestyle and getting preventive care are important in promoting health and wellness. Ask your health care provider about:  The right schedule for you to have regular tests and exams.  Things you can do on your own to prevent diseases and keep yourself healthy. What should I know about diet, weight, and exercise? Eat a healthy diet   Eat a diet that includes plenty of vegetables, fruits, low-fat dairy products, and lean protein.  Do not eat a lot of foods that are high in solid fats, added sugars, or sodium. Maintain a healthy weight Body mass index (BMI) is used to identify weight problems. It estimates body fat based on height and weight. Your health care provider can help determine your BMI and help you achieve or maintain a healthy weight. Get regular exercise Get regular exercise. This is one of the most important things you can do for your health. Most  adults should:  Exercise for at least 150 minutes each week. The exercise should increase your heart rate and make you sweat (moderate-intensity exercise).  Do strengthening exercises at least twice a week. This is in addition to the moderate-intensity exercise.  Spend less time sitting. Even light physical activity can be beneficial. Watch cholesterol and blood lipids Have your blood tested for lipids and cholesterol at 29 years of age, then have this test every 5 years. Have your cholesterol levels checked more often if:  Your lipid or cholesterol levels are high.  You are older than 29 years of age.  You are at high risk for heart disease. What should I know about cancer screening? Depending on your health history and family history, you may need to have cancer screening at various ages. This may include screening for:  Breast cancer.  Cervical cancer.  Colorectal cancer.  Skin cancer.  Lung cancer. What should I know about heart disease, diabetes, and high blood pressure? Blood pressure and heart disease  High blood pressure causes heart disease and increases the risk of stroke. This is more likely to develop in people who have high blood pressure readings, are of African descent, or are overweight.  Have your blood pressure checked: ? Every 3-5 years if you are 18-39 years of age. ? Every year if you are 40 years old or older. Diabetes Have regular diabetes screenings. This checks your fasting blood sugar level. Have the screening done:  Once every   three years after age 40 if you are at a normal weight and have a low risk for diabetes.  More often and at a younger age if you are overweight or have a high risk for diabetes. What should I know about preventing infection? Hepatitis B If you have a higher risk for hepatitis B, you should be screened for this virus. Talk with your health care provider to find out if you are at risk for hepatitis B infection. Hepatitis  C Testing is recommended for:  Everyone born from 1945 through 1965.  Anyone with known risk factors for hepatitis C. Sexually transmitted infections (STIs)  Get screened for STIs, including gonorrhea and chlamydia, if: ? You are sexually active and are younger than 29 years of age. ? You are older than 29 years of age and your health care provider tells you that you are at risk for this type of infection. ? Your sexual activity has changed since you were last screened, and you are at increased risk for chlamydia or gonorrhea. Ask your health care provider if you are at risk.  Ask your health care provider about whether you are at high risk for HIV. Your health care provider may recommend a prescription medicine to help prevent HIV infection. If you choose to take medicine to prevent HIV, you should first get tested for HIV. You should then be tested every 3 months for as long as you are taking the medicine. Pregnancy  If you are about to stop having your period (premenopausal) and you may become pregnant, seek counseling before you get pregnant.  Take 400 to 800 micrograms (mcg) of folic acid every day if you become pregnant.  Ask for birth control (contraception) if you want to prevent pregnancy. Osteoporosis and menopause Osteoporosis is a disease in which the bones lose minerals and strength with aging. This can result in bone fractures. If you are 65 years old or older, or if you are at risk for osteoporosis and fractures, ask your health care provider if you should:  Be screened for bone loss.  Take a calcium or vitamin D supplement to lower your risk of fractures.  Be given hormone replacement therapy (HRT) to treat symptoms of menopause. Follow these instructions at home: Lifestyle  Do not use any products that contain nicotine or tobacco, such as cigarettes, e-cigarettes, and chewing tobacco. If you need help quitting, ask your health care provider.  Do not use street  drugs.  Do not share needles.  Ask your health care provider for help if you need support or information about quitting drugs. Alcohol use  Do not drink alcohol if: ? Your health care provider tells you not to drink. ? You are pregnant, may be pregnant, or are planning to become pregnant.  If you drink alcohol: ? Limit how much you use to 0-1 drink a day. ? Limit intake if you are breastfeeding.  Be aware of how much alcohol is in your drink. In the [place] one drink equals one 12 oz bottle of beer (355 mL), one 5 oz glass of wine (148 mL), or one 1 oz glass of hard liquor (44 mL). General instructions  Schedule regular health, dental, and eye exams.  Stay current with your vaccines.  Tell your health care provider if: ? You often feel depressed. ? You have ever been abused or do not feel safe at home. Summary  Adopting a healthy lifestyle and getting preventive care are important in promoting health and wellness.    Follow your health care provider's instructions about healthy diet, exercising, and getting tested or screened for diseases.  Follow your health care provider's instructions on monitoring your cholesterol and blood pressure. This information is not intended to replace advice given to you by your health care provider. Make sure you discuss any questions you have with your health care provider. Document Revised: 07/03/2018 Document Reviewed: 07/03/2018 Elsevier Patient Education  2020 [employer].   Health Maintenance, Female Adopting a healthy lifestyle and getting preventive care are important in promoting health and wellness. Ask your health care provider about:  The right schedule for you to have regular tests and exams.  Things you can do on your own to prevent diseases and keep yourself healthy. What should I know about diet, weight, and exercise? Eat a healthy diet   Eat a diet that includes plenty of vegetables, fruits, low-fat dairy products, and lean  protein.  Do not eat a lot of foods that are high in solid fats, added sugars, or sodium. Maintain a healthy weight Body mass index (BMI) is used to identify weight problems. It estimates body fat based on height and weight. Your health care provider can help determine your BMI and help you achieve or maintain a healthy weight. Get regular exercise Get regular exercise. This is one of the most important things you can do for your health. Most adults should:  Exercise for at least 150 minutes each week. The exercise should increase your heart rate and make you sweat (moderate-intensity exercise).  Do strengthening exercises at least twice a week. This is in addition to the moderate-intensity exercise.  Spend less time sitting. Even light physical activity can be beneficial. Watch cholesterol and blood lipids Have your blood tested for lipids and cholesterol at 29 years of age, then have this test every 5 years. Have your cholesterol levels checked more often if:  Your lipid or cholesterol levels are high.  You are older than 29 years of age.  You are at high risk for heart disease. What should I know about cancer screening? Depending on your health history and family history, you may need to have cancer screening at various ages. This may include screening for:  Breast cancer.  Cervical cancer.  Colorectal cancer.  Skin cancer.  Lung cancer. What should I know about heart disease, diabetes, and high blood pressure? Blood pressure and heart disease  High blood pressure causes heart disease and increases the risk of stroke. This is more likely to develop in people who have high blood pressure readings, are of African descent, or are overweight.  Have your blood pressure checked: ? Every 3-5 years if you are 35-17 years of age. ? Every year if you are 21 years old or older. Diabetes Have regular diabetes screenings. This checks your fasting blood sugar level. Have the screening  done:  Once every three years after age 52 if you are at a normal weight and have a low risk for diabetes.  More often and at a younger age if you are overweight or have a high risk for diabetes. What should I know about preventing infection? Hepatitis B If you have a higher risk for hepatitis B, you should be screened for this virus. Talk with your health care provider to find out if you are at risk for hepatitis B infection. Hepatitis C Testing is recommended for:  Everyone born from [birth year] through 1965.  Anyone with known risk factors for hepatitis C. Sexually transmitted infections (STIs)  Get screened for STIs, including gonorrhea and chlamydia, if: ? You are sexually active and are younger than 29 years of age. ? You are older than 29 years of age and your health care provider tells you that you are at risk for this type of infection. ? Your sexual activity has changed since you were last screened, and you are at increased risk for chlamydia or gonorrhea. Ask your health care provider if you are at risk.  Ask your health care provider about whether you are at high risk for HIV. Your health care provider may recommend a prescription medicine to help prevent HIV infection. If you choose to take medicine to prevent HIV, you should first get tested for HIV. You should then be tested every 3 months for as long as you are taking the medicine. Pregnancy  If you are about to stop having your period (premenopausal) and you may become pregnant, seek counseling before you get pregnant.  Take 400 to 800 micrograms (mcg) of folic acid every day if you become pregnant.  Ask for birth control (contraception) if you want to prevent pregnancy. Osteoporosis and menopause Osteoporosis is a disease in which the bones lose minerals and strength with aging. This can result in bone fractures. If you are 79 years old or older, or if you are at risk for osteoporosis and fractures, ask your health care  provider if you should:  Be screened for bone loss.  Take a calcium or vitamin D supplement to lower your risk of fractures.  Be given hormone replacement therapy (HRT) to treat symptoms of menopause. Follow these instructions at home: Lifestyle  Do not use any products that contain nicotine or tobacco, such as cigarettes, e-cigarettes, and chewing tobacco. If you need help quitting, ask your health care provider.  Do not use street drugs.  Do not share needles.  Ask your health care provider for help if you need support or information about quitting drugs. Alcohol use  Do not drink alcohol if: ? Your health care provider tells you not to drink. ? You are pregnant, may be pregnant, or are planning to become pregnant.  If you drink alcohol: ? Limit how much you use to 0-1 drink a day. ? Limit intake if you are breastfeeding.  Be aware of how much alcohol is in your drink. In the [place] one drink equals one 12 oz bottle of beer (355 mL), one 5 oz glass of wine (148 mL), or one 1 oz glass of hard liquor (44 mL). General instructions  Schedule regular health, dental, and eye exams.  Stay current with your vaccines.  Tell your health care provider if: ? You often feel depressed. ? You have ever been abused or do not feel safe at home. Summary  Adopting a healthy lifestyle and getting preventive care are important in promoting health and wellness.  Follow your health care provider's instructions about healthy diet, exercising, and getting tested or screened for diseases.  Follow your health care provider's instructions on monitoring your cholesterol and blood pressure. This information is not intended to replace advice given to you by your health care provider. Make sure you discuss any questions you have with your health care provider. Document Revised: 07/03/2018 Document Reviewed: 07/03/2018 Elsevier Patient Education  2020 [employer].

## 2019-12-01 NOTE — Progress Notes (Signed)
Elizabeth Stanton 29 y.o.   Chief Complaint  Patient presents with   Annual Exam    Pt also has some questions regarding getting on birth control(IUD)    HISTORY OF PRESENT ILLNESS: This is a 29 y.o. female here for annual exam. History of CP. History of recent PE, presently on Xarelto 20 mg daily. 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  Seen by hematologist recently. Seen by gynecologist last month.  Up to date with her female maintenance items. Thinking about using Mirena as birth control.  HPI   Prior to Admission medications   Medication Sig Start Date End Date Taking? Authorizing Provider  rivaroxaban (XARELTO) 20 MG TABS tablet Take 1 tablet (20 mg total) by mouth daily with supper. 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)    Cerebral palsy (HCC)    Right hemiplegia (HCC) 10/17/2017   History of cerebral palsy     Past Medical History:  Diagnosis Date   Cerebral palsy (HCC)    Dysmenorrhea    History of abnormal cervical Pap smear    + HPV   History of cerebral palsy    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)     Past Surgical History:  Procedure Laterality Date   COLPOSCOPY      Social History   Socioeconomic History   Marital status: Married    Spouse name: Not on file   Number of children: Not on file   Years of education: Not on file   Highest education level: Not on file  Occupational History   Not on file  Tobacco Use   Smoking status: Never Smoker   Smokeless tobacco: Never Used  Substance and Sexual Activity   Alcohol use: Yes    Alcohol/week: 1.0 standard drinks    Types: 1  Standard drinks or equivalent per week    Comment: wine   Drug use: No   Sexual activity: Yes    Partners: Male    Birth control/protection: Pill  Other Topics Concern   Not on file  Social History Narrative   Not on file   Social Determinants of Health   Financial Resource Strain:    Difficulty of Paying Living Expenses:   Food Insecurity:    Worried About Programme researcher, broadcasting/film/video in the Last Year:    Barista in the Last Year:   Transportation Needs:    Freight forwarder (Medical):    Lack of Transportation (Non-Medical):   Physical Activity:    Days of Exercise per Week:    Minutes of Exercise per Session:   Stress:    Feeling of Stress :   Social Connections:    Frequency of Communication with Friends and Family:    Frequency of Social Gatherings with Friends and Family:    Attends Religious Services:    Active Member of Clubs or Organizations:    Attends Banker Meetings:    Marital Status:   Intimate Partner Violence:    Fear of Current or Ex-Partner:    Emotionally Abused:    Physically Abused:    Sexually Abused:  Family History  Problem Relation Age of Onset   Hypertension Mother    Diabetes Mother    Hypertension Maternal Grandmother    Cancer Paternal Grandfather        skin     Review of Systems  Constitutional: Negative.  Negative for fever and malaise/fatigue.  HENT: Negative.  Negative for congestion and sore throat.   Eyes: Negative.   Respiratory: Negative.  Negative for cough and shortness of breath.   Cardiovascular: Negative.  Negative for chest pain and palpitations.  Gastrointestinal: Negative.  Negative for abdominal pain, blood in stool, diarrhea, melena, nausea and vomiting.  Genitourinary: Negative.  Negative for dysuria and hematuria.  Musculoskeletal: Negative.  Negative for back pain, myalgias and neck pain.  Skin: Negative.  Negative for rash.  Neurological: Negative.  Negative  for dizziness and headaches.  All other systems reviewed and are negative.  Today's Vitals   12/01/19 1311  BP: 128/86  Pulse: 89  Temp: 98.6 F (37 C)  TempSrc: Temporal  SpO2: 98%  Weight: 210 lb 3.2 oz (95.3 kg)  Height: 5\' 4"  (1.626 m)   Body mass index is 36.08 kg/m.   Physical Exam Vitals reviewed.  Constitutional:      Appearance: Normal appearance.  HENT:     Head: Normocephalic.     Mouth/Throat:     Mouth: Mucous membranes are moist.     Pharynx: Oropharynx is clear.  Eyes:     Extraocular Movements: Extraocular movements intact.     Conjunctiva/sclera: Conjunctivae normal.     Pupils: Pupils are equal, round, and reactive to light.  Cardiovascular:     Rate and Rhythm: Normal rate and regular rhythm.     Pulses: Normal pulses.     Heart sounds: Normal heart sounds.  Pulmonary:     Effort: Pulmonary effort is normal.     Breath sounds: Normal breath sounds.  Abdominal:     Palpations: Abdomen is soft.     Tenderness: There is no abdominal tenderness.  Musculoskeletal:        General: Normal range of motion.     Cervical back: Normal range of motion and neck supple.  Skin:    General: Skin is warm and dry.  Neurological:     Mental Status: She is alert and oriented to person, place, and time. Mental status is at baseline.     Comments: Right arm atrophy  Psychiatric:        Mood and Affect: Mood normal.        Behavior: Behavior normal.      ASSESSMENT & PLAN: Elizabeth Stanton was seen today for annual exam.  Diagnoses and all orders for this visit:  Routine general medical examination at a health care facility  History of cerebral palsy  Right hemiplegia (HCC)  Protein S deficiency (HCC)  History of pulmonary embolism  Current use of long term anticoagulation    Patient Instructions       If you have lab work done today you will be contacted with your lab results within the next 2 weeks.  If you have not heard from then please  contact us. The fastest way to get your results is to register for My Chart.   IF you received an x-ray today, you will receive an invoice from [facility]. Please contact [facility] at [phone] with questions or concerns regarding your invoice.   IF you received labwork today, you will receive an invoice from [facility]. Please contact  [facility] at [phone] with questions or concerns regarding your invoice.   Our billing staff will not be able to assist you with questions regarding bills from these companies.  You will be contacted with the lab results as soon as they are available. The fastest way to get your results is to activate your My Chart account. Instructions are located on the last page of this paperwork. If you have not heard from us regarding the results in 2 weeks, please contact this office.     Health Maintenance, Female Adopting a healthy lifestyle and getting preventive care are important in promoting health and wellness. Ask your health care provider about:  The right schedule for you to have regular tests and exams.  Things you can do on your own to prevent diseases and keep yourself healthy. What should I know about diet, weight, and exercise? Eat a healthy diet   Eat a diet that includes plenty of vegetables, fruits, low-fat dairy products, and lean protein.  Do not eat a lot of foods that are high in solid fats, added sugars, or sodium. Maintain a healthy weight Body mass index (BMI) is used to identify weight problems. It estimates body fat based on height and weight. Your health care provider can help determine your BMI and help you achieve or maintain a healthy weight. Get regular exercise Get regular exercise. This is one of the most important things you can do for your health. Most adults should:  Exercise for at least 150 minutes each week. The exercise should increase your heart rate and make you sweat (moderate-intensity  exercise).  Do strengthening exercises at least twice a week. This is in addition to the moderate-intensity exercise.  Spend less time sitting. Even light physical activity can be beneficial. Watch cholesterol and blood lipids Have your blood tested for lipids and cholesterol at 29 years of age, then have this test every 5 years. Have your cholesterol levels checked more often if:  Your lipid or cholesterol levels are high.  You are older than 29 years of age.  You are at high risk for heart disease. What should I know about cancer screening? Depending on your health history and family history, you may need to have cancer screening at various ages. This may include screening for:  Breast cancer.  Cervical cancer.  Colorectal cancer.  Skin cancer.  Lung cancer. What should I know about heart disease, diabetes, and high blood pressure? Blood pressure and heart disease  High blood pressure causes heart disease and increases the risk of stroke. This is more likely to develop in people who have high blood pressure readings, are of African descent, or are overweight.  Have your blood pressure checked: ? Every 3-5 years if you are 3018-29 years of age. ? Every year if you are 29 years old or older. Diabetes Have regular diabetes screenings. This checks your fasting blood sugar level. Have the screening done:  Once every three years after age 29 if you are at a normal weight and have a low risk for diabetes.  More often and at a younger age if you are overweight or have a high risk for diabetes. What should I know about preventing infection? Hepatitis B If you have a higher risk for hepatitis B, you should be screened for this virus. Talk with your health care provider to find out if you are at risk for hepatitis B infection. Hepatitis C Testing is recommended for:  Everyone born from [birth year] through 1965.  Anyone with known risk factors for hepatitis C. Sexually transmitted  infections (STIs)  Get screened for STIs, including gonorrhea and chlamydia, if: ? You are sexually active and are younger than 29 years of age. ? You are older than 29 years of age and your health care provider tells you that you are at risk for this type of infection. ? Your sexual activity has changed since you were last screened, and you are at increased risk for chlamydia or gonorrhea. Ask your health care provider if you are at risk.  Ask your health care provider about whether you are at high risk for HIV. Your health care provider may recommend a prescription medicine to help prevent HIV infection. If you choose to take medicine to prevent HIV, you should first get tested for HIV. You should then be tested every 3 months for as long as you are taking the medicine. Pregnancy  If you are about to stop having your period (premenopausal) and you may become pregnant, seek counseling before you get pregnant.  Take 400 to 800 micrograms (mcg) of folic acid every day if you become pregnant.  Ask for birth control (contraception) if you want to prevent pregnancy. Osteoporosis and menopause Osteoporosis is a disease in which the bones lose minerals and strength with aging. This can result in bone fractures. If you are 6 years old or older, or if you are at risk for osteoporosis and fractures, ask your health care provider if you should:  Be screened for bone loss.  Take a calcium or vitamin D supplement to lower your risk of fractures.  Be given hormone replacement therapy (HRT) to treat symptoms of menopause. Follow these instructions at home: Lifestyle  Do not use any products that contain nicotine or tobacco, such as cigarettes, e-cigarettes, and chewing tobacco. If you need help quitting, ask your health care provider.  Do not use street drugs.  Do not share needles.  Ask your health care provider for help if you need support or information about quitting drugs. Alcohol use  Do  not drink alcohol if: ? Your health care provider tells you not to drink. ? You are pregnant, may be pregnant, or are planning to become pregnant.  If you drink alcohol: ? Limit how much you use to 0-1 drink a day. ? Limit intake if you are breastfeeding.  Be aware of how much alcohol is in your drink. In the [place] one drink equals one 12 oz bottle of beer (355 mL), one 5 oz glass of wine (148 mL), or one 1 oz glass of hard liquor (44 mL). General instructions  Schedule regular health, dental, and eye exams.  Stay current with your vaccines.  Tell your health care provider if: ? You often feel depressed. ? You have ever been abused or do not feel safe at home. Summary  Adopting a healthy lifestyle and getting preventive care are important in promoting health and wellness.  Follow your health care provider's instructions about healthy diet, exercising, and getting tested or screened for diseases.  Follow your health care provider's instructions on monitoring your cholesterol and blood pressure. This information is not intended to replace advice given to you by your health care provider. Make sure you discuss any questions you have with your health care provider. Document Revised: 07/03/2018 Document Reviewed: 07/03/2018 Elsevier Patient Education  2020 [place] Maintenance, Female Adopting a healthy lifestyle and getting preventive care are important in promoting health and wellness. Ask  your health care provider about:  The right schedule for you to have regular tests and exams.  Things you can do on your own to prevent diseases and keep yourself healthy. What should I know about diet, weight, and exercise? Eat a healthy diet   Eat a diet that includes plenty of vegetables, fruits, low-fat dairy products, and lean protein.  Do not eat a lot of foods that are high in solid fats, added sugars, or sodium. Maintain a healthy weight Body mass index (BMI) is used  to identify weight problems. It estimates body fat based on height and weight. Your health care provider can help determine your BMI and help you achieve or maintain a healthy weight. Get regular exercise Get regular exercise. This is one of the most important things you can do for your health. Most adults should:  Exercise for at least 150 minutes each week. The exercise should increase your heart rate and make you sweat (moderate-intensity exercise).  Do strengthening exercises at least twice a week. This is in addition to the moderate-intensity exercise.  Spend less time sitting. Even light physical activity can be beneficial. Watch cholesterol and blood lipids Have your blood tested for lipids and cholesterol at 29 years of age, then have this test every 5 years. Have your cholesterol levels checked more often if:  Your lipid or cholesterol levels are high.  You are older than 29 years of age.  You are at high risk for heart disease. What should I know about cancer screening? Depending on your health history and family history, you may need to have cancer screening at various ages. This may include screening for:  Breast cancer.  Cervical cancer.  Colorectal cancer.  Skin cancer.  Lung cancer. What should I know about heart disease, diabetes, and high blood pressure? Blood pressure and heart disease  High blood pressure causes heart disease and increases the risk of stroke. This is more likely to develop in people who have high blood pressure readings, are of African descent, or are overweight.  Have your blood pressure checked: ? Every 3-5 years if you are 25-76 years of age. ? Every year if you are 24 years old or older. Diabetes Have regular diabetes screenings. This checks your fasting blood sugar level. Have the screening done:  Once every three years after age 40 if you are at a normal weight and have a low risk for diabetes.  More often and at a younger age if  you are overweight or have a high risk for diabetes. What should I know about preventing infection? Hepatitis B If you have a higher risk for hepatitis B, you should be screened for this virus. Talk with your health care provider to find out if you are at risk for hepatitis B infection. Hepatitis C Testing is recommended for:  Everyone born from [birth year] through 1965.  Anyone with known risk factors for hepatitis C. Sexually transmitted infections (STIs)  Get screened for STIs, including gonorrhea and chlamydia, if: ? You are sexually active and are younger than 29 years of age. ? You are older than 28 years of age and your health care provider tells you that you are at risk for this type of infection. ? Your sexual activity has changed since you were last screened, and you are at increased risk for chlamydia or gonorrhea. Ask your health care provider if you are at risk.  Ask your health care provider about whether you are at high risk for  HIV. Your health care provider may recommend a prescription medicine to help prevent HIV infection. If you choose to take medicine to prevent HIV, you should first get tested for HIV. You should then be tested every 3 months for as long as you are taking the medicine. Pregnancy  If you are about to stop having your period (premenopausal) and you may become pregnant, seek counseling before you get pregnant.  Take 400 to 800 micrograms (mcg) of folic acid every day if you become pregnant.  Ask for birth control (contraception) if you want to prevent pregnancy. Osteoporosis and menopause Osteoporosis is a disease in which the bones lose minerals and strength with aging. This can result in bone fractures. If you are 44 years old or older, or if you are at risk for osteoporosis and fractures, ask your health care provider if you should:  Be screened for bone loss.  Take a calcium or vitamin D supplement to lower your risk of fractures.  Be given hormone  replacement therapy (HRT) to treat symptoms of menopause. Follow these instructions at home: Lifestyle  Do not use any products that contain nicotine or tobacco, such as cigarettes, e-cigarettes, and chewing tobacco. If you need help quitting, ask your health care provider.  Do not use street drugs.  Do not share needles.  Ask your health care provider for help if you need support or information about quitting drugs. Alcohol use  Do not drink alcohol if: ? Your health care provider tells you not to drink. ? You are pregnant, may be pregnant, or are planning to become pregnant.  If you drink alcohol: ? Limit how much you use to 0-1 drink a day. ? Limit intake if you are breastfeeding.  Be aware of how much alcohol is in your drink. In the [place] one drink equals one 12 oz bottle of beer (355 mL), one 5 oz glass of wine (148 mL), or one 1 oz glass of hard liquor (44 mL). General instructions  Schedule regular health, dental, and eye exams.  Stay current with your vaccines.  Tell your health care provider if: ? You often feel depressed. ? You have ever been abused or do not feel safe at home. Summary  Adopting a healthy lifestyle and getting preventive care are important in promoting health and wellness.  Follow your health care provider's instructions about healthy diet, exercising, and getting tested or screened for diseases.  Follow your health care provider's instructions on monitoring your cholesterol and blood pressure. This information is not intended to replace advice given to you by your health care provider. Make sure you discuss any questions you have with your health care provider. Document Revised: 07/03/2018 Document Reviewed: 07/03/2018 Elsevier Patient Education  2020 Elsevier Inc.      Edwina Barth, MD [facility]

## 2019-12-02 ENCOUNTER — Telehealth: Payer: Self-pay

## 2019-12-02 DIAGNOSIS — Z3009 Encounter for other general counseling and advice on contraception: Secondary | ICD-10-CM

## 2019-12-02 NOTE — Telephone Encounter (Signed)
Left message to call Noreene Larsson, RN at [facility] [phone].    AEX 10/29/19 -Mirena IUD discussed with Dr. Oscar La

## 2019-12-02 NOTE — Telephone Encounter (Signed)
Patient is calling in regards to discuss IUD insertion. Patient states she has discussed this with pcp and would ike to move forward with possibly getting the IUD.

## 2019-12-05 NOTE — Telephone Encounter (Signed)
Spoke with patient. Patient calling to proceed with Mirena IUD discussed at 10/29/19 AEX with Dr. Oscar La. Patient has discussed Mirena with PCP. LMP 11/13/19. No contraceptive. Order placed for Mirena IUD insertion for precert. Instructed patient to return call to office 1st day of menses to schedule while on menses. Patient verbalizes understanding and is agreeable.   Routing to Dr. Oscar La for final review.   Cc: [facility], [address] [place]

## 2019-12-05 NOTE — Telephone Encounter (Signed)
Patient is returning call. Patient stated she will be at work and would like to be reached there. Her work number is [phone] an patient stated to ask for her when calling.

## 2019-12-09 NOTE — Telephone Encounter (Signed)
Call placed to convey benefits for Mirena insertion. °

## 2019-12-10 NOTE — Telephone Encounter (Signed)
Patient returned my call and I conveyed the benefits. Patient understands/agreeable with the benefits. Patient is aware of the cancellation policy. Patient to call at onset of cycle.

## 2019-12-15 NOTE — Telephone Encounter (Signed)
Patient is calling in regards to getting her menstrual cycle and ready for IUD insertion.

## 2019-12-15 NOTE — Telephone Encounter (Signed)
Left message to call Jill, RN at [facility] [phone].   

## 2019-12-15 NOTE — Telephone Encounter (Signed)
Spoke with patient. LMP 12/16/19. Calling to schedule Mirena IUD insertion.  IUD insertion scheduled for 12/16/19 at 2:30pm with Dr. Oscar La.  Advised to take Motrin 800 mg with food and water one hour before procedure. Patient verbalizes understanding and is agreeable.   Routing to provider for final review. Patient is agreeable to disposition. Will close encounter.  Cc: [facility] Pilon, [address] [place]

## 2019-12-16 ENCOUNTER — Ambulatory Visit (INDEPENDENT_AMBULATORY_CARE_PROVIDER_SITE_OTHER): Payer: BC Managed Care – PPO | Admitting: Obstetrics and Gynecology

## 2019-12-16 ENCOUNTER — Other Ambulatory Visit: Payer: Self-pay

## 2019-12-16 ENCOUNTER — Encounter: Payer: Self-pay | Admitting: Obstetrics and Gynecology

## 2019-12-16 VITALS — BP 130/66 | HR 85 | Temp 98.2°F | Ht 64.0 in | Wt 210.0 lb

## 2019-12-16 DIAGNOSIS — Z3009 Encounter for other general counseling and advice on contraception: Secondary | ICD-10-CM

## 2019-12-16 DIAGNOSIS — Z30430 Encounter for insertion of intrauterine contraceptive device: Secondary | ICD-10-CM

## 2019-12-16 NOTE — Patient Instructions (Signed)
IUD Post-procedure Instructions Cramping is common.  You may take Ibuprofen, Aleve, or Tylenol for the cramping.  This should resolve within 24 hours.   You may have a small amount of spotting.  You should wear a mini pad for the next few days. You may have intercourse in 24 hours. You need to call the office if you have any pelvic pain, fever, heavy bleeding, or foul smelling vaginal discharge. Shower or bathe as normal Use back up contraception for one week 

## 2019-12-16 NOTE — Progress Notes (Signed)
GYNECOLOGY  VISIT   HPI: 29 y.o.   Married Black or [place] American Not Hispanic or Latino  female   G0P0000 with Patient's last menstrual period was 12/13/2019 (exact date).   here for mirena IUD insertion.    No STD concerns.   GYNECOLOGIC HISTORY: Patient's last menstrual period was 12/13/2019 (exact date). Contraception:none Menopausal hormone therapy: none         OB History    Gravida  0   Para  0   Term  0   Preterm  0   AB  0   Living  0     SAB  0   TAB  0   Ectopic  0   Multiple  0   Live Births  0              Patient Active Problem List   Diagnosis Date Noted  . 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  . Acute pulmonary embolism (HCC) 09/18/2019  . Pulmonary embolism on long-term anticoagulation therapy (HCC) 09/18/2019  . 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 Cerebral palsy (HCC)   . Right hemiplegia (HCC) 10/17/2017  . History of cerebral palsy     Past Medical History:  Diagnosis Date  . Cerebral palsy (HCC)   . Dysmenorrhea   . History of abnormal cervical Pap smear    + HPV  . History of cerebral palsy   . 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 [facility])     Past Surgical History:  Procedure Laterality Date  . COLPOSCOPY      Current Outpatient Medications  Medication Sig Dispense Refill  . rivaroxaban (XARELTO) 20 MG TABS tablet Take 1 tablet (20 mg total) by mouth daily with supper. 90 tablet 1   No current facility-administered medications for this visit.     ALLERGIES: Lavender oil  Family History  Problem Relation Age of Onset  . Hypertension Mother   . Diabetes Mother   . Hypertension Maternal Grandmother   . Cancer Paternal Grandfather        skin    Social History   Socioeconomic History  . Marital status: Married    Spouse name: Not on file  . Number of children: Not on file  . Years of education: Not on file  . Highest education level: Not on file  Occupational History  . Not on  file  Tobacco Use  . Smoking status: Never Smoker  . Smokeless tobacco: Never Used  Substance and Sexual Activity  . Alcohol use: Yes    Alcohol/week: 1.0 standard drinks    Types: 1 Standard drinks or equivalent per week    Comment: wine  . Drug use: No  . Sexual activity: Yes    Partners: Male    Birth control/protection: Pill  Other Topics Concern  . Not on file  Social History Narrative  . Not on file   Social Determinants of Health   Financial Resource Strain:   . Difficulty of Paying Living Expenses:   Food Insecurity:   . Worried About Programme researcher, broadcasting/film/video in the Last Year:   . Barista in the Last Year:   Transportation Needs:   . Freight forwarder (Medical):   Marland Kitchen Lack of Transportation (Non-Medical):   Physical Activity:   . Days of Exercise per Week:   . Minutes of Exercise per Session:   Stress:   . Feeling of Stress :   Social  Connections:   . Frequency of Communication with Friends and Family:   . Frequency of Social Gatherings with Friends and Family:   . Attends Religious Services:   . Active Member of Clubs or Organizations:   . Attends Archivist Meetings:   Marland Kitchen Marital Status:   Intimate Partner Violence:   . Fear of Current or Ex-Partner:   . Emotionally Abused:   Marland Kitchen Physically Abused:   . Sexually Abused:     Review of Systems  All other systems reviewed and are negative.   PHYSICAL EXAMINATION:    BP 130/66   Pulse 85   Temp 98.2 F (36.8 C)   Ht 5\' 4"  (1.626 m)   Wt 210 lb (95.3 kg)   LMP 12/13/2019 (Exact Date)   SpO2 99%   BMI 36.05 kg/m     General appearance: alert, cooperative and appears stated age   Pelvic: External genitalia:  no lesions              Urethra:  normal appearing urethra with no masses, tenderness or lesions              Bartholins and Skenes: normal                 Vagina: normal appearing vagina with normal color and discharge, no lesions              Cervix:  no lesions  The risks  of the mirena IUD were reviewed with the patient, including infection, abnormal bleeding and uterine perfortion. Consent was signed.  A speculum was placed in the vagina, the cervix was cleansed with betadine. A tenaculum was placed on the cervix, the uterus sounded to 7-8 cm. The cervix was easily dilated to a 5 hagar dilator  The Mirena IUD was inserted without difficulty. The string were cut to 3-4 cm. The tenaculum was removed. Slight oozing from the tenaculum site was stopped with pressure.   The patient tolerated the procedure well.    Chaperone was present for exam.  ASSESSMENT Contraception management, mirena IUD inserted    PLAN UPT negative F/U in one month, call with any concerns   An After Visit Summary was printed and given to the patient.

## 2019-12-24 ENCOUNTER — Ambulatory Visit: Payer: BC Managed Care – PPO | Admitting: Emergency Medicine

## 2020-01-12 ENCOUNTER — Ambulatory Visit: Payer: BC Managed Care – PPO | Admitting: Obstetrics and Gynecology

## 2020-01-12 ENCOUNTER — Other Ambulatory Visit: Payer: Self-pay

## 2020-01-12 ENCOUNTER — Encounter: Payer: Self-pay | Admitting: Obstetrics and Gynecology

## 2020-01-12 VITALS — BP 110/64 | HR 85 | Temp 97.5°F | Ht 64.0 in | Wt 206.0 lb

## 2020-01-12 DIAGNOSIS — Z30431 Encounter for routine checking of intrauterine contraceptive device: Secondary | ICD-10-CM

## 2020-01-12 NOTE — Progress Notes (Signed)
GYNECOLOGY  VISIT   HPI: 29 y.o.   Married Black or [place] American Not Hispanic or Latino  female   G0P0000 with Patient's last menstrual period was 01/09/2020 (exact date).   here for IUD follow up. H/O PE on OCP's, protein S def. Patient states that she has been spotting since she had her IUD placed. She started her period of Friday and has had moderate cramping.  Went from spotting after insertion to her cycle. Cramps on her cycle, feel like prior to OCP's, up to an 8/10 in severity, feels okay with tylenol (can't take NSAID's until she is off Xarelto). Cramping overall is improving. Cycle flow has improved already. Not cramping in between cycles. She has been   GYNECOLOGIC HISTORY: Patient's last menstrual period was 01/09/2020 (exact date). Contraception:UD Menopausal hormone therapy: none        OB History    Gravida  0   Para  0   Term  0   Preterm  0   AB  0   Living  0     SAB  0   TAB  0   Ectopic  0   Multiple  0   Live Births  0              Patient Active Problem List   Diagnosis Date Noted  . 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  . Acute pulmonary embolism (HCC) 09/18/2019  . Pulmonary embolism on long-term anticoagulation therapy (HCC) 09/18/2019  . 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 Cerebral palsy (HCC)   . Right hemiplegia (HCC) 10/17/2017  . History of cerebral palsy     Past Medical History:  Diagnosis Date  . Cerebral palsy (HCC)   . Dysmenorrhea   . History of abnormal cervical Pap smear    + HPV  . History of cerebral palsy   . 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 COLPOSCOPY      Current Outpatient Medications  Medication Sig Dispense Refill  . rivaroxaban (XARELTO) 20 MG TABS tablet Take 1 tablet (20 mg total) by mouth daily with supper. 90 tablet 1   No current facility-administered medications for this visit.     ALLERGIES:  Lavender oil  Family History  Problem Relation Age of Onset  . Hypertension Mother   . Diabetes Mother   . Hypertension Maternal Grandmother   . Cancer Paternal Grandfather        skin    Social History   Socioeconomic History  . Marital status: Married    Spouse name: Not on file  . Number of children: Not on file  . Years of education: Not on file  . Highest education level: Not on file  Occupational History  . Not on file  Tobacco Use  . Smoking status: Never Smoker  . Smokeless tobacco: Never Used  Vaping Use  . Vaping Use: Never used  Substance and Sexual Activity  . Alcohol use: Yes    Alcohol/week: 1.0 standard drink    Types: 1 Standard drinks or equivalent per week    Comment: wine  . Drug use: No  . Sexual activity: Yes    Partners: Male    Birth control/protection: Pill  Other Topics Concern  . Not on file  Social History Narrative  . Not on file   Social Determinants of Health   Financial Resource Strain:   .  Difficulty of Paying Living Expenses:   Food Insecurity:   . Worried About Charity fundraiser in the Last Year:   . Arboriculturist in the Last Year:   Transportation Needs:   . Film/video editor (Medical):   Marland Kitchen Lack of Transportation (Non-Medical):   Physical Activity:   . Days of Exercise per Week:   . Minutes of Exercise per Session:   Stress:   . Feeling of Stress :   Social Connections:   . Frequency of Communication with Friends and Family:   . Frequency of Social Gatherings with Friends and Family:   . Attends Religious Services:   . Active Member of Clubs or Organizations:   . Attends Archivist Meetings:   Marland Kitchen Marital Status:   Intimate Partner Violence:   . Fear of Current or Ex-Partner:   . Emotionally Abused:   Marland Kitchen Physically Abused:   . Sexually Abused:     Review of Systems  All other systems reviewed and are negative.   PHYSICAL EXAMINATION:    BP 110/64   Pulse 85   Temp (!) 97.5 F (36.4 C)    Ht 5\' 4"  (1.626 m)   Wt 206 lb (93.4 kg)   LMP 01/09/2020 (Exact Date)   SpO2 100%   BMI 35.36 kg/m     General appearance: alert, cooperative and appears stated age  Pelvic: External genitalia:  no lesions              Urethra:  normal appearing urethra with no masses, tenderness or lesions              Bartholins and Skenes: normal                 Vagina: normal appearing vagina with normal color and discharge, no lesions              Cervix: no lesions and IUD string 3 cm              Bimanual Exam:  Uterus:  normal size, contour, position, consistency, mobility, non-tender              Adnexa: no mass, fullness, tenderness               Chaperone was present for exam.  ASSESSMENT Mirena IUD check, overall doing well Dysmenorrhea, no change from baseline cramps. Hopefully will improve with time    PLAN Routine f/u

## 2020-04-11 ENCOUNTER — Other Ambulatory Visit: Payer: Self-pay | Admitting: Emergency Medicine

## 2020-05-07 ENCOUNTER — Other Ambulatory Visit: Payer: BC Managed Care – PPO

## 2020-05-07 ENCOUNTER — Other Ambulatory Visit: Payer: Self-pay

## 2020-05-07 ENCOUNTER — Inpatient Hospital Stay: Payer: BC Managed Care – PPO | Attending: Oncology

## 2020-05-07 DIAGNOSIS — I2699 Other pulmonary embolism without acute cor pulmonale: Secondary | ICD-10-CM

## 2020-05-07 DIAGNOSIS — Z86711 Personal history of pulmonary embolism: Secondary | ICD-10-CM | POA: Diagnosis present

## 2020-05-07 DIAGNOSIS — Z7901 Long term (current) use of anticoagulants: Secondary | ICD-10-CM | POA: Diagnosis not present

## 2020-05-09 LAB — PROTEIN S PANEL
Protein S Activity: 85 % (ref 63–140)
Protein S Ag, Free: 93 % (ref 61–136)
Protein S Ag, Total: 86 % (ref 60–150)

## 2020-05-14 ENCOUNTER — Other Ambulatory Visit: Payer: Self-pay

## 2020-05-14 ENCOUNTER — Inpatient Hospital Stay (HOSPITAL_BASED_OUTPATIENT_CLINIC_OR_DEPARTMENT_OTHER): Payer: BC Managed Care – PPO | Admitting: Oncology

## 2020-05-14 VITALS — BP 123/86 | HR 93 | Resp 18 | Ht 64.0 in | Wt 208.7 lb

## 2020-05-14 DIAGNOSIS — I2699 Other pulmonary embolism without acute cor pulmonale: Secondary | ICD-10-CM | POA: Diagnosis not present

## 2020-05-14 DIAGNOSIS — Z86711 Personal history of pulmonary embolism: Secondary | ICD-10-CM | POA: Diagnosis not present

## 2020-05-14 NOTE — Progress Notes (Signed)
Hematology and Oncology Follow Up Visit  Elizabeth Stanton [id number] 1991-03-04 29 y.o. 05/14/2020 1:24 PM Elizabeth Stanton, MDSagardia, [place], [place]   Principle Diagnosis: 29 year old woman with pulmonary embolism diagnosed in September 18, 2019.  Her diagnosis was a provoked by immobilization after a fall while on oral contraceptives.  Hypercoagulable work-up was unrevealing.     Current therapy: Xarelto 20 mg daily started in February 2021.  Interim History: Ms. Russman returns today for a follow-up visit.  Since our last visit, he reports no major changes in her health.  She has tolerated being on Xarelto without any complaints.  He denies any nausea vomiting or hemoptysis.  She denies any hematochezia or melena.  Performance status and quality of life remained reasonable.  She is currently using IUD for birth control.     Medications: I have reviewed the patient's current medications.  Current Outpatient Medications  Medication Sig Dispense Refill  . XARELTO 20 MG TABS tablet TAKE 1 TABLET BY MOUTH EVERY DAY WITH SUPPER 90 tablet 1   No current facility-administered medications for this visit.     Allergies:  Allergies  Allergen Reactions  . Lavender Oil Hives     Physical Exam: Blood pressure 123/86, pulse 93, resp. rate 18, height 5\' 4"  (1.626 m), weight 208 lb 11.2 oz (94.7 kg), SpO2 100 %.   ECOG:  0      Lab Results: Lab Results  Component Value Date   WBC 6.6 09/24/2019   HGB 13.8 09/24/2019   HCT 41.8 09/24/2019   MCV 91 09/24/2019   PLT 521 (H) 09/24/2019     Chemistry      Component Value Date/Time   NA 139 09/24/2019 1112   K 4.4 09/24/2019 1112   CL 104 09/24/2019 1112   CO2 21 09/24/2019 1112   BUN 9 09/24/2019 1112   CREATININE 0.73 09/24/2019 1112      Component Value Date/Time   CALCIUM 9.8 09/24/2019 1112   ALKPHOS 50 09/24/2019 1112   AST 15 09/24/2019 1112   ALT 17 09/24/2019 1112   BILITOT <0.2 09/24/2019 1112     Results  for Elizabeth Stanton, Elizabeth "TORI" (MRN Foster Simpson) as of 05/14/2020 13:26  Ref. Range 05/07/2020 11:08  Protein S-Functional Latest Ref Range: 63 - 140 % 85  Protein S, Free Latest Ref Range: 61 - 136 % 93  Protein S, Total Latest Ref Range: 60 - 150 % 86     Impression and Plan:  1.    Pulmonary embolism diagnosed in February 2021.  He presented with small subsegmental PE provoked by immobilization after a fall while on oral contraceptives.  She has had completed more than 6 months of full dose anticoagulation.  Risks and benefits of discontinuation of Xarelto were discussed.  Given the fact that her thrombosis was provoked with immobilization as well as while on oral contraception, it is reasonable to discontinue anticoagulation at this time.  Repeat protein S panel showed no abnormalities to indicate a hypercoagulable state.  She understands if she develops thrombosis in the future that is unprovoked,  long-term anticoagulation may be needed.  2.  Follow-up:  Will be as needed in the future.    30  minutes were spent on this encounter.  Time was dedicated to reviewing laboratory data, discussing treatment options and future plan of care reviewed.    March 2021, MD 10/22/20211:24 PM

## 2020-11-01 ENCOUNTER — Ambulatory Visit: Payer: BC Managed Care – PPO | Admitting: Obstetrics and Gynecology

## 2020-11-01 ENCOUNTER — Encounter: Payer: Self-pay | Admitting: Obstetrics and Gynecology

## 2020-11-01 ENCOUNTER — Other Ambulatory Visit (HOSPITAL_COMMUNITY)
Admission: RE | Admit: 2020-11-01 | Discharge: 2020-11-01 | Disposition: A | Discharge status: Home / Self Care | Payer: BC Managed Care – PPO | Source: Ambulatory Visit | Attending: Obstetrics and Gynecology | Admitting: Obstetrics and Gynecology

## 2020-11-01 ENCOUNTER — Ambulatory Visit (INDEPENDENT_AMBULATORY_CARE_PROVIDER_SITE_OTHER): Payer: BC Managed Care – PPO | Admitting: Obstetrics and Gynecology

## 2020-11-01 ENCOUNTER — Other Ambulatory Visit: Payer: Self-pay

## 2020-11-01 VITALS — BP 122/68 | HR 77 | Ht 64.0 in | Wt 215.0 lb

## 2020-11-01 DIAGNOSIS — Z833 Family history of diabetes mellitus: Secondary | ICD-10-CM

## 2020-11-01 DIAGNOSIS — I2699 Other pulmonary embolism without acute cor pulmonale: Secondary | ICD-10-CM | POA: Diagnosis not present

## 2020-11-01 DIAGNOSIS — Z01419 Encounter for gynecological examination (general) (routine) without abnormal findings: Secondary | ICD-10-CM

## 2020-11-01 DIAGNOSIS — F418 Other specified anxiety disorders: Secondary | ICD-10-CM | POA: Diagnosis not present

## 2020-11-01 DIAGNOSIS — Z124 Encounter for screening for malignant neoplasm of cervix: Secondary | ICD-10-CM

## 2020-11-01 DIAGNOSIS — Z30431 Encounter for routine checking of intrauterine contraceptive device: Secondary | ICD-10-CM

## 2020-11-01 DIAGNOSIS — Z6836 Body mass index (BMI) 36.0-36.9, adult: Secondary | ICD-10-CM

## 2020-11-01 DIAGNOSIS — Z0000 Encounter for general adult medical examination without abnormal findings: Secondary | ICD-10-CM

## 2020-11-01 DIAGNOSIS — Z7901 Long term (current) use of anticoagulants: Secondary | ICD-10-CM

## 2020-11-01 DIAGNOSIS — T45515A Adverse effect of anticoagulants, initial encounter: Secondary | ICD-10-CM

## 2020-11-01 MED ORDER — CITALOPRAM HYDROBROMIDE 20 MG PO TABS: ORAL_TABLET | ORAL | 1 refills | Status: DC

## 2020-11-01 NOTE — Patient Instructions (Addendum)
Counselor: Roosevelt Locks [phone]   EXERCISE   We recommended that you start or continue a regular exercise program for good health. Physical activity is anything that gets your body moving, some is better than none. The CDC recommends 150 minutes per week of Moderate-Intensity Aerobic Activity and 2 or more days of Muscle Strengthening Activity.  Benefits of exercise are limitless: helps weight loss/weight maintenance, improves mood and energy, helps with depression and anxiety, improves sleep, tones and strengthens muscles, improves balance, improves bone density, protects from chronic conditions such as heart disease, high blood pressure and diabetes and so much more. To learn more visit: http://kirby-bean.org/  DIET: Good nutrition starts with a healthy diet of fruits, vegetables, whole grains, and lean protein sources. Drink plenty of water for hydration. Minimize empty calories, sodium, sweets. For more information about dietary recommendations visit: CriticalGas.be and https://www.carpenter-henry.info/  ALCOHOL:  Women should limit their alcohol intake to no more than 7 drinks/beers/glasses of wine (combined, not each!) per week. Moderation of alcohol intake to this level decreases your risk of breast cancer and liver damage.  If you are concerned that you may have a problem, or your friends have told you they are concerned about your drinking, there are many resources to help. A well-known program that is free, effective, and available to all people all over the nation is Alcoholics Anonymous.  Check out this site to learn more: BeverageBargains.co.za   CALCIUM AND VITAMIN D:  Adequate intake of calcium and Vitamin D are recommended for bone health.  You should be getting between 1000-1200 mg of calcium and 800 units of Vitamin D daily between diet and supplements  PAP SMEARS:  Pap smears, to check for cervical cancer  or precancers,  have traditionally been done yearly, scientific advances have shown that most women can have pap smears less often.  However, every woman still should have a physical exam from her gynecologist every year. It will include a breast check, inspection of the vulva and vagina to check for abnormal growths or skin changes, a visual exam of the cervix, and then an exam to evaluate the size and shape of the uterus and ovaries. We will also provide age appropriate advice regarding health maintenance, like when you should have certain vaccines, screening for sexually transmitted diseases, bone density testing, colonoscopy, mammograms, etc.   MAMMOGRAMS:  All women over 48 years old should have a routine mammogram.   COLON CANCER SCREENING: Now recommend starting at age 2. At this time colonoscopy is not covered for routine screening until 50. There are take home tests that can be done between 45-49.   COLONOSCOPY:  Colonoscopy to screen for colon cancer is recommended for all women at age 39.  We know, you hate the idea of the prep.  We agree, BUT, having colon cancer and not knowing it is worse!!  Colon cancer so often starts as a polyp that can be seen and removed at colonscopy, which can quite literally save your life!  And if your first colonoscopy is normal and you have no family history of colon cancer, most women don't have to have it again for 10 years.  Once every ten years, you can do something that may end up saving your life, right?  We will be happy to help you get it scheduled when you are ready.  Be sure to check your insurance coverage so you understand how much it will cost.  It may be covered as a preventative service at  no cost, but you should check your particular policy.      Breast Self-Awareness Breast self-awareness means being familiar with how your breasts look and feel. It involves checking your breasts regularly and reporting any changes to your health care  provider. Practicing breast self-awareness is important. A change in your breasts can be a sign of a serious medical problem. Being familiar with how your breasts look and feel allows you to find any problems early, when treatment is more likely to be successful. All women should practice breast self-awareness, including women who have had breast implants. How to do a breast self-exam One way to learn what is normal for your breasts and whether your breasts are changing is to do a breast self-exam. To do a breast self-exam: Look for Changes  1. Remove all the clothing above your waist. 2. Stand in front of a mirror in a room with good lighting. 3. Put your hands on your hips. 4. Push your hands firmly downward. 5. Compare your breasts in the mirror. Look for differences between them (asymmetry), such as: ? Differences in shape. ? Differences in size. ? Puckers, dips, and bumps in one breast and not the other. 6. Look at each breast for changes in your skin, such as: ? Redness. ? Scaly areas. 7. Look for changes in your nipples, such as: ? Discharge. ? Bleeding. ? Dimpling. ? Redness. ? A change in position. Feel for Changes Carefully feel your breasts for lumps and changes. It is best to do this while lying on your back on the floor and again while sitting or standing in the shower or tub with soapy water on your skin. Feel each breast in the following way:  Place the arm on the side of the breast you are examining above your head.  Feel your breast with the other hand.  Start in the nipple area and make  inch (2 cm) overlapping circles to feel your breast. Use the pads of your three middle fingers to do this. Apply light pressure, then medium pressure, then firm pressure. The light pressure will allow you to feel the tissue closest to the skin. The medium pressure will allow you to feel the tissue that is a little deeper. The firm pressure will allow you to feel the tissue close to  the ribs.  Continue the overlapping circles, moving downward over the breast until you feel your ribs below your breast.  Move one finger-width toward the center of the body. Continue to use the  inch (2 cm) overlapping circles to feel your breast as you move slowly up toward your collarbone.  Continue the up and down exam using all three pressures until you reach your armpit.  Write Down What You Find  Write down what is normal for each breast and any changes that you find. Keep a written record with breast changes or normal findings for each breast. By writing this information down, you do not need to depend only on memory for size, tenderness, or location. Write down where you are in your menstrual cycle, if you are still menstruating. If you are having trouble noticing differences in your breasts, do not get discouraged. With time you will become more familiar with the variations in your breasts and more comfortable with the exam. How often should I examine my breasts? Examine your breasts every month. If you are breastfeeding, the best time to examine your breasts is after a feeding or after using a breast pump. If  you menstruate, the best time to examine your breasts is 5-7 days after your period is over. During your period, your breasts are lumpier, and it may be more difficult to notice changes. When should I see my health care provider? See your health care provider if you notice:  A change in shape or size of your breasts or nipples.  A change in the skin of your breast or nipples, such as a reddened or scaly area.  Unusual discharge from your nipples.  A lump or thick area that was not there before.  Pain in your breasts.  Anything that concerns you.   Managing Anxiety, Adult After being diagnosed with an anxiety disorder, you may be relieved to know why you have felt or behaved a certain way. You may also feel overwhelmed about the treatment ahead and what it will mean for  your life. With care and support, you can manage this condition and recover from it. How to manage lifestyle changes Managing stress and anxiety Stress is your body's reaction to life changes and events, both good and bad. Most stress will last just a few hours, but stress can be ongoing and can lead to more than just stress. Although stress can play a major role in anxiety, it is not the same as anxiety. Stress is usually caused by something external, such as a deadline, test, or competition. Stress normally passes after the triggering event has ended.  Anxiety is caused by something internal, such as imagining a terrible outcome or worrying that something will go wrong that will devastate you. Anxiety often does not go away even after the triggering event is over, and it can become long-term (chronic) worry. It is important to understand the differences between stress and anxiety and to manage your stress effectively so that it does not lead to an anxious response. Talk with your health care provider or a counselor to learn more about reducing anxiety and stress. He or she may suggest tension reduction techniques, such as:  Music therapy. This can include creating or listening to music that you enjoy and that inspires you.  Mindfulness-based meditation. This involves being aware of your normal breaths while not trying to control your breathing. It can be done while sitting or walking.  Centering prayer. This involves focusing on a word, phrase, or sacred image that means something to you and brings you peace.  Deep breathing. To do this, expand your stomach and inhale slowly through your nose. Hold your breath for 3-5 seconds. Then exhale slowly, letting your stomach muscles relax.  Self-talk. This involves identifying thought patterns that lead to anxiety reactions and changing those patterns.  Muscle relaxation. This involves tensing muscles and then relaxing them. Choose a tension reduction  technique that suits your lifestyle and personality. These techniques take time and practice. Set aside 5-15 minutes a day to do them. Therapists can offer counseling and training in these techniques. The training to help with anxiety may be covered by some insurance plans. Other things you can do to manage stress and anxiety include:  Keeping a stress/anxiety diary. This can help you learn what triggers your reaction and then learn ways to manage your response.  Thinking about how you react to certain situations. You may not be able to control everything, but you can control your response.  Making time for activities that help you relax and not feeling guilty about spending your time in this way.  Visual imagery and yoga can help you  stay calm and relax.   Medicines Medicines can help ease symptoms. Medicines for anxiety include:  Anti-anxiety drugs.  Antidepressants. Medicines are often used as a primary treatment for anxiety disorder. Medicines will be prescribed by a health care provider. When used together, medicines, psychotherapy, and tension reduction techniques may be the most effective treatment. Relationships Relationships can play a big part in helping you recover. Try to spend more time connecting with trusted friends and family members. Consider going to couples counseling, taking family education classes, or going to family therapy. Therapy can help you and others better understand your condition. How to recognize changes in your anxiety Everyone responds differently to treatment for anxiety. Recovery from anxiety happens when symptoms decrease and stop interfering with your daily activities at home or work. This may mean that you will start to:  Have better concentration and focus. Worry will interfere less in your daily thinking.  Sleep better.  Be less irritable.  Have more energy.  Have improved memory. It is important to recognize when your condition is getting  worse. Contact your health care provider if your symptoms interfere with home or work and you feel like your condition is not improving. Follow these instructions at home: Activity  Exercise. Most adults should do the following: ? Exercise for at least 150 minutes each week. The exercise should increase your heart rate and make you sweat (moderate-intensity exercise). ? Strengthening exercises at least twice a week.  Get the right amount and quality of sleep. Most adults need 7-9 hours of sleep each night. Lifestyle  Eat a healthy diet that includes plenty of vegetables, fruits, whole grains, low-fat dairy products, and lean protein. Do not eat a lot of foods that are high in solid fats, added sugars, or salt.  Make choices that simplify your life.  Do not use any products that contain nicotine or tobacco, such as cigarettes, e-cigarettes, and chewing tobacco. If you need help quitting, ask your health care provider.  Avoid caffeine, alcohol, and certain over-the-counter cold medicines. These may make you feel worse. Ask your pharmacist which medicines to avoid.   General instructions  Take over-the-counter and prescription medicines only as told by your health care provider.  Keep all follow-up visits as told by your health care provider. This is important. Where to find support You can get help and support from these sources:  Self-help groups.  Online and [employer].  A trusted spiritual leader.  Couples counseling.  Family education classes.  Family therapy. Where to find more information You may find that joining a support group helps you deal with your anxiety. The following sources can help you locate counselors or support groups near you:  Mental Health America: www.mentalhealthamerica.net  Anxiety and Depression Association of [place] (ADAA): ProgramCam.de  [employer] on Mental Illness (NAMI): www.nami.org Contact a health care provider if  you:  Have a hard time staying focused or finishing daily tasks.  Spend many hours a day feeling worried about everyday life.  Become exhausted by worry.  Start to have headaches, feel tense, or have nausea.  Urinate more than normal.  Have diarrhea. Get help right away if you have:  A racing heart and shortness of breath.  Thoughts of hurting yourself or others. If you ever feel like you may hurt yourself or others, or have thoughts about taking your own life, get help right away. You can go to your nearest emergency department or call:  Your local emergency services (911 in  the [place]  A suicide crisis helpline, such as the National Suicide Prevention Lifeline at (905)072-2856. This is open 24 hours a day. Summary  Taking steps to learn and use tension reduction techniques can help calm you and help prevent triggering an anxiety reaction.  When used together, medicines, psychotherapy, and tension reduction techniques may be the most effective treatment.  Family, friends, and partners can play a big part in helping you recover from an anxiety disorder. This information is not intended to replace advice given to you by your health care provider. Make sure you discuss any questions you have with your health care provider. Document Revised: 12/10/2018 Document Reviewed: 12/10/2018 Elsevier Patient Education  2021 Elsevier Inc. http://APA.org/depression-guideline"> https://clinicalkey.com"> http://point-of-care.elsevierperformancemanager.com/skills/"> http://point-of-care.elsevierperformancemanager.com">  Managing Depression, Adult Depression is a mental health condition that affects your thoughts, feelings, and actions. Being diagnosed with depression can bring you relief if you did not know why you have felt or behaved a certain way. It could also leave you feeling overwhelmed with uncertainty about your future. Preparing yourself to manage your symptoms can help you feel more  positive about your future. How to manage lifestyle changes Managing stress Stress is your body's reaction to life changes and events, both good and bad. Stress can add to your feelings of depression. Learning to manage your stress can help lessen your feelings of depression. Try some of the following approaches to reducing your stress (stress reduction techniques):  Listen to music that you enjoy and that inspires you.  Try using a meditation app or take a meditation class.  Develop a practice that helps you connect with your spiritual self. Walk in nature, pray, or go to a place of worship.  Do some deep breathing. To do this, inhale slowly through your nose. Pause at the top of your inhale for a few seconds and then exhale slowly, letting your muscles relax.  Practice yoga to help relax and work your muscles. Choose a stress reduction technique that suits your lifestyle and personality. These techniques take time and practice to develop. Set aside 5-15 minutes a day to do them. Therapists can offer training in these techniques. Other things you can do to manage stress include:  Keeping a stress diary.  Knowing your limits and saying no when you think something is too much.  Paying attention to how you react to certain situations. You may not be able to control everything, but you can change your reaction.  Adding humor to your life by watching funny films or TV shows.  Making time for activities that you enjoy and that relax you.   Medicines Medicines, such as antidepressants, are often a part of treatment for depression.  Talk with your pharmacist or health care provider about all the medicines, supplements, and herbal products that you take, their possible side effects, and what medicines and other products are safe to take together.  Make sure to report any side effects you may have to your health care provider. Relationships Your health care provider may suggest family  therapy, couples therapy, or individual therapy as part of your treatment. How to recognize changes Everyone responds differently to treatment for depression. As you recover from depression, you may start to:  Have more interest in doing activities.  Feel less hopeless.  Have more energy.  Overeat less often, or have a better appetite.  Have better mental focus. It is important to recognize if your depression is not getting better or is getting worse. The symptoms you had  in the beginning may return, such as:  Tiredness (fatigue) or low energy.  Eating too much or too little.  Sleeping too much or too little.  Feeling restless, agitated, or hopeless.  Trouble focusing or making decisions.  Unexplained physical complaints.  Feeling irritable, angry, or aggressive. If you or your family members notice these symptoms coming back, let your health care provider know right away. Follow these instructions at home: Activity  Try to get some form of exercise each day, such as walking, biking, swimming, or lifting weights.  Practice stress reduction techniques.  Engage your mind by taking a class or doing some volunteer work.   Lifestyle  Get the right amount and quality of sleep.  Cut down on using caffeine, tobacco, alcohol, and other potentially harmful substances.  Eat a healthy diet that includes plenty of vegetables, fruits, whole grains, low-fat dairy products, and lean protein. Do not eat a lot of foods that are high in solid fats, added sugars, or salt (sodium). General instructions  Take over-the-counter and prescription medicines only as told by your health care provider.  Keep all follow-up visits as told by your health care provider. This is important. Where to find support Talking to others Friends and family members can be sources of support and guidance. Talk to trusted friends or family members about your condition. Explain your symptoms to them, and let them  know that you are working with a health care provider to treat your depression. Tell friends and family members how they also can be helpful.   Finances  Find appropriate mental health providers that fit with your financial situation.  Talk with your health care provider about options to get reduced prices on your medicines. Where to find more information You can find support in your area from:  Anxiety and Depression Association of America (ADAA): www.adaa.org  Mental Health America: www.mentalhealthamerica.net  [employer] on Mental Illness: www.nami.org Contact a health care provider if:  You stop taking your antidepressant medicines, and you have any of these symptoms: ? Nausea. ? Headache. ? Light-headedness. ? Chills and body aches. ? Not being able to sleep (insomnia).  You or your friends and family think your depression is getting worse. Get help right away if:  You have thoughts of hurting yourself or others. If you ever feel like you may hurt yourself or others, or have thoughts about taking your own life, get help right away. Go to your nearest emergency department or:  Call your local emergency services (911 in the [place]  Call a suicide crisis helpline, such as the National Suicide Prevention Lifeline at 450-154-60101-581-105-2910. This is open 24 hours a day in the [place]  Text the Crisis Text Line at 818-433-2178741741 (in the [place] Summary  If you are diagnosed with depression, preparing yourself to manage your symptoms is a good way to feel positive about your future.  Work with your health care provider on a management plan that includes stress reduction techniques, medicines (if applicable), therapy, and healthy lifestyle habits.  Keep talking with your health care provider about how your treatment is working.  If you have thoughts about taking your own life, call a suicide crisis helpline or text a crisis text line. This information is not intended to replace advice given  to you by your health care provider. Make sure you discuss any questions you have with your health care provider. Document Revised: 05/21/2019 Document Reviewed: 05/21/2019 Elsevier Patient Education  2021 [employer]
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
## 2020-11-02 LAB — COMPREHENSIVE METABOLIC PANEL
AG Ratio: 1.1 (calc) (ref 1.0–2.5)
ALT: 11 U/L (ref 6–29)
AST: 15 U/L (ref 10–30)
Albumin: 4.1 g/dL (ref 3.6–5.1)
Alkaline phosphatase (APISO): 65 U/L (ref 31–125)
BUN: 8 mg/dL (ref 7–25)
CO2: 28 mmol/L (ref 20–32)
Calcium: 9.6 mg/dL (ref 8.6–10.2)
Chloride: 102 mmol/L (ref 98–110)
Creat: 0.73 mg/dL (ref 0.50–1.10)
Globulin: 3.8 g/dL (calc) — ABNORMAL HIGH (ref 1.9–3.7)
Glucose, Bld: 84 mg/dL (ref 65–99)
Potassium: 4.9 mmol/L (ref 3.5–5.3)
Sodium: 138 mmol/L (ref 135–146)
Total Bilirubin: 0.3 mg/dL (ref 0.2–1.2)
Total Protein: 7.9 g/dL (ref 6.1–8.1)

## 2020-11-02 LAB — CBC
HCT: 40.8 % (ref 35.0–45.0)
Hemoglobin: 13.4 g/dL (ref 11.7–15.5)
MCH: 30 pg (ref 27.0–33.0)
MCHC: 32.8 g/dL (ref 32.0–36.0)
MCV: 91.3 fL (ref 80.0–100.0)
MPV: 9 fL (ref 7.5–12.5)
Platelets: 463 10*3/uL — ABNORMAL HIGH (ref 140–400)
RBC: 4.47 10*6/uL (ref 3.80–5.10)
RDW: 11.7 % (ref 11.0–15.0)
WBC: 8.4 10*3/uL (ref 3.8–10.8)

## 2020-11-02 LAB — HEMOGLOBIN A1C
Hgb A1c MFr Bld: 5.2 % of total Hgb (ref <5.7)
Mean Plasma Glucose: 103 mg/dL
eAG (mmol/L): 5.7 mmol/L

## 2020-11-02 LAB — TSH: TSH: 1.85 mIU/L

## 2020-11-02 LAB — LIPID PANEL
Cholesterol: 154 mg/dL (ref <200)
HDL: 47 mg/dL — ABNORMAL LOW (ref >=50)
LDL Cholesterol (Calc): 88 mg/dL (calc)
Non-HDL Cholesterol (Calc): 107 mg/dL (calc) (ref <130)
Total CHOL/HDL Ratio: 3.3 (calc) (ref <5.0)
Triglycerides: 99 mg/dL (ref <150)

## 2020-11-03 LAB — CYTOLOGY - PAP
Comment: NEGATIVE
Diagnosis: UNDETERMINED — AB
High risk HPV: POSITIVE — AB

## 2020-11-08 ENCOUNTER — Other Ambulatory Visit: Payer: Self-pay | Admitting: Obstetrics and Gynecology

## 2020-11-08 DIAGNOSIS — R87610 Atypical squamous cells of undetermined significance on cytologic smear of cervix (ASC-US): Secondary | ICD-10-CM

## 2020-11-08 DIAGNOSIS — R87810 Cervical high risk human papillomavirus (HPV) DNA test positive: Secondary | ICD-10-CM

## 2020-11-24 ENCOUNTER — Other Ambulatory Visit: Payer: Self-pay | Admitting: Obstetrics and Gynecology

## 2020-11-24 DIAGNOSIS — F418 Other specified anxiety disorders: Secondary | ICD-10-CM

## 2020-11-25 NOTE — Telephone Encounter (Signed)
Pharmacy is requesting a 90 day supply

## 2020-11-30 ENCOUNTER — Encounter: Payer: Self-pay | Admitting: Obstetrics and Gynecology

## 2020-11-30 ENCOUNTER — Ambulatory Visit: Payer: BC Managed Care – PPO | Admitting: Obstetrics and Gynecology

## 2020-11-30 ENCOUNTER — Other Ambulatory Visit: Payer: Self-pay

## 2020-11-30 VITALS — BP 110/64 | HR 75 | Ht 64.0 in | Wt 213.0 lb

## 2020-11-30 DIAGNOSIS — F418 Other specified anxiety disorders: Secondary | ICD-10-CM

## 2020-11-30 MED ORDER — CITALOPRAM HYDROBROMIDE 10 MG PO TABS: 10.0000 mg | ORAL_TABLET | Freq: Every day | ORAL | 1 refills | Status: DC

## 2020-11-30 NOTE — Progress Notes (Signed)
GYNECOLOGY  VISIT   HPI: 30 y.o.   Married Black or [place] American Not Hispanic or Latino  female   G0P0000 with Patient's last menstrual period was 11/15/2020.   here for f/u on Celexa, she was started on Celexa last month for depression and anxiety. The patient states that Celexa has helped with her anxiety and she doesn't have the feeling of not wanting to get up anymore. She feels more productive. Making an effort to get out and do things. Feels she is 70% better. Not crying any more.  No current side effects from the medications.  She is interested in therapy, hasn't found a therapist yet.  She and her husband are working on their marriage, not intimate at this time.   GYNECOLOGIC HISTORY: Patient's last menstrual period was 11/15/2020. Contraception: IUD  Menopausal hormone therapy: none         OB History    Gravida  0   Para  0   Term  0   Preterm  0   AB  0   Living  0     SAB  0   IAB  0   Ectopic  0   Multiple  0   Live Births  0              Patient Active Problem List   Diagnosis Date Noted  . 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  . Acute pulmonary embolism (HCC) 09/18/2019  . Pulmonary embolism on long-term anticoagulation therapy (HCC) 09/18/2019  . 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 Cerebral palsy (HCC)   . Right hemiplegia (HCC) 10/17/2017  . History of cerebral palsy     Past Medical History:  Diagnosis Date  . Cerebral palsy (HCC)   . Dysmenorrhea   . History of abnormal cervical Pap smear    + HPV  . History of cerebral palsy   . History of pulmonary embolism   . 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 COLPOSCOPY      Current Outpatient Medications  Medication Sig Dispense Refill  . citalopram (CELEXA) 20 MG tablet Take 1/2 tablet po qd for one week, if tolerating then increase to one tablet a day. 30 tablet 1  . levonorgestrel  (MIRENA) 20 MCG/24HR IUD 1 each by Intrauterine route once.     No current facility-administered medications for this visit.     ALLERGIES: Lavender oil  Family History  Problem Relation Age of Onset  . Hypertension Mother   . Diabetes Mother   . Hypertension Maternal Grandmother   . Cancer Paternal Grandfather        skin    Social History   Socioeconomic History  . Marital status: Married    Spouse name: Not on file  . Number of children: Not on file  . Years of education: Not on file  . Highest education level: Not on file  Occupational History  . Not on file  Tobacco Use  . Smoking status: Never Smoker  . Smokeless tobacco: Never Used  Vaping Use  . Vaping Use: Never used  Substance and Sexual Activity  . Alcohol use: Yes    Alcohol/week: 1.0 standard drink    Types: 1 Standard drinks or equivalent per week    Comment: wine  . Drug use: No  . Sexual activity: Yes    Partners: Male    Birth  control/protection: Pill  Other Topics Concern  . Not on file  Social History Narrative  . Not on file   Social Determinants of Health   Financial Resource Strain: Not on file  Food Insecurity: Not on file  Transportation Needs: Not on file  Physical Activity: Not on file  Stress: Not on file  Social Connections: Not on file  Intimate Partner Violence: Not on file    Review of Systems  Psychiatric/Behavioral: Positive for depression. The patient is nervous/anxious.   All other systems reviewed and are negative.   PHYSICAL EXAMINATION:    BP 110/64   Pulse 75   Ht 5\' 4"  (1.626 m)   Wt 213 lb (96.6 kg)   LMP 11/15/2020   SpO2 98%   BMI 36.56 kg/m     General appearance: alert, cooperative and appears stated age  67. Depression with anxiety Anxiety is better, depression is 70% better. Will increase her Celexa to 30 mg a day and f/u in one month - citalopram (CELEXA) 10 MG tablet; Take 1 tablet (10 mg total) by mouth daily. Add to the 20 mg tablet for a  total dose of 30 mg daily  Dispense: 30 tablet; Refill: 1

## 2020-12-01 ENCOUNTER — Ambulatory Visit (INDEPENDENT_AMBULATORY_CARE_PROVIDER_SITE_OTHER): Payer: BC Managed Care – PPO | Admitting: Emergency Medicine

## 2020-12-01 ENCOUNTER — Encounter: Payer: Self-pay | Admitting: Emergency Medicine

## 2020-12-01 VITALS — BP 120/82 | HR 98 | Temp 98.1°F | Ht 64.0 in | Wt 215.0 lb

## 2020-12-01 DIAGNOSIS — G809 Cerebral palsy, unspecified: Secondary | ICD-10-CM

## 2020-12-01 DIAGNOSIS — Z6836 Body mass index (BMI) 36.0-36.9, adult: Secondary | ICD-10-CM

## 2020-12-01 DIAGNOSIS — Z0000 Encounter for general adult medical examination without abnormal findings: Secondary | ICD-10-CM | POA: Diagnosis not present

## 2020-12-01 DIAGNOSIS — G8191 Hemiplegia, unspecified affecting right dominant side: Secondary | ICD-10-CM | POA: Diagnosis not present

## 2020-12-01 DIAGNOSIS — I2782 Chronic pulmonary embolism: Secondary | ICD-10-CM | POA: Diagnosis not present

## 2020-12-01 DIAGNOSIS — I2609 Other pulmonary embolism with acute cor pulmonale: Secondary | ICD-10-CM | POA: Insufficient documentation

## 2020-12-01 NOTE — Patient Instructions (Signed)
Health Maintenance, Female Adopting a healthy lifestyle and getting preventive care are important in promoting health and wellness. Ask your health care provider about:  The right schedule for you to have regular tests and exams.  Things you can do on your own to prevent diseases and keep yourself healthy. What should I know about diet, weight, and exercise? Eat a healthy diet  Eat a diet that includes plenty of vegetables, fruits, low-fat dairy products, and lean protein.  Do not eat a lot of foods that are high in solid fats, added sugars, or sodium.   Maintain a healthy weight Body mass index (BMI) is used to identify weight problems. It estimates body fat based on height and weight. Your health care provider can help determine your BMI and help you achieve or maintain a healthy weight. Get regular exercise Get regular exercise. This is one of the most important things you can do for your health. Most adults should:  Exercise for at least 150 minutes each week. The exercise should increase your heart rate and make you sweat (moderate-intensity exercise).  Do strengthening exercises at least twice a week. This is in addition to the moderate-intensity exercise.  Spend less time sitting. Even light physical activity can be beneficial. Watch cholesterol and blood lipids Have your blood tested for lipids and cholesterol at 30 years of age, then have this test every 5 years. Have your cholesterol levels checked more often if:  Your lipid or cholesterol levels are high.  You are older than 30 years of age.  You are at high risk for heart disease. What should I know about cancer screening? Depending on your health history and family history, you may need to have cancer screening at various ages. This may include screening for:  Breast cancer.  Cervical cancer.  Colorectal cancer.  Skin cancer.  Lung cancer. What should I know about heart disease, diabetes, and high blood  pressure? Blood pressure and heart disease  High blood pressure causes heart disease and increases the risk of stroke. This is more likely to develop in people who have high blood pressure readings, are of African descent, or are overweight.  Have your blood pressure checked: ? Every 3-5 years if you are 18-39 years of age. ? Every year if you are 40 years old or older. Diabetes Have regular diabetes screenings. This checks your fasting blood sugar level. Have the screening done:  Once every three years after age 40 if you are at a normal weight and have a low risk for diabetes.  More often and at a younger age if you are overweight or have a high risk for diabetes. What should I know about preventing infection? Hepatitis B If you have a higher risk for hepatitis B, you should be screened for this virus. Talk with your health care provider to find out if you are at risk for hepatitis B infection. Hepatitis C Testing is recommended for:  Everyone born from 1945 through 1965.  Anyone with known risk factors for hepatitis C. Sexually transmitted infections (STIs)  Get screened for STIs, including gonorrhea and chlamydia, if: ? You are sexually active and are younger than 30 years of age. ? You are older than 30 years of age and your health care provider tells you that you are at risk for this type of infection. ? Your sexual activity has changed since you were last screened, and you are at increased risk for chlamydia or gonorrhea. Ask your health care provider   if you are at risk.  Ask your health care provider about whether you are at high risk for HIV. Your health care provider may recommend a prescription medicine to help prevent HIV infection. If you choose to take medicine to prevent HIV, you should first get tested for HIV. You should then be tested every 3 months for as long as you are taking the medicine. Pregnancy  If you are about to stop having your period (premenopausal) and  you may become pregnant, seek counseling before you get pregnant.  Take 400 to 800 micrograms (mcg) of folic acid every day if you become pregnant.  Ask for birth control (contraception) if you want to prevent pregnancy. Osteoporosis and menopause Osteoporosis is a disease in which the bones lose minerals and strength with aging. This can result in bone fractures. If you are 65 years old or older, or if you are at risk for osteoporosis and fractures, ask your health care provider if you should:  Be screened for bone loss.  Take a calcium or vitamin D supplement to lower your risk of fractures.  Be given hormone replacement therapy (HRT) to treat symptoms of menopause. Follow these instructions at home: Lifestyle  Do not use any products that contain nicotine or tobacco, such as cigarettes, e-cigarettes, and chewing tobacco. If you need help quitting, ask your health care provider.  Do not use street drugs.  Do not share needles.  Ask your health care provider for help if you need support or information about quitting drugs. Alcohol use  Do not drink alcohol if: ? Your health care provider tells you not to drink. ? You are pregnant, may be pregnant, or are planning to become pregnant.  If you drink alcohol: ? Limit how much you use to 0-1 drink a day. ? Limit intake if you are breastfeeding.  Be aware of how much alcohol is in your drink. In the [place] one drink equals one 12 oz bottle of beer (355 mL), one 5 oz glass of wine (148 mL), or one 1 oz glass of hard liquor (44 mL). General instructions  Schedule regular health, dental, and eye exams.  Stay current with your vaccines.  Tell your health care provider if: ? You often feel depressed. ? You have ever been abused or do not feel safe at home. Summary  Adopting a healthy lifestyle and getting preventive care are important in promoting health and wellness.  Follow your health care provider's instructions about healthy  diet, exercising, and getting tested or screened for diseases.  Follow your health care provider's instructions on monitoring your cholesterol and blood pressure. This information is not intended to replace advice given to you by your health care provider. Make sure you discuss any questions you have with your health care provider. Document Revised: 07/03/2018 Document Reviewed: 07/03/2018 Elsevier Patient Education  2021 [employer]  

## 2020-12-01 NOTE — Progress Notes (Signed)
Elizabeth Stanton 30 y.o.   Chief Complaint  Patient presents with  . Annual Exam  Last office visit for annual exam as follows: HISTORY OF PRESENT ILLNESS: This is a 30 y.o. female here for annual exam. History of CP. History of recent PE, presently on Xarelto 20 mg daily. 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  Seen by hematologist recently. Seen by gynecologist last month.  Up to date with her female maintenance items. Thinking about using Mirena as birth control.  HISTORY OF PRESENT ILLNESS: This is a 30 y.o. female here for her annual exam. Has history of CP. History of PE.  Completed Xarelto for 6 months.  It was a provoked event.  Xarelto discontinued last October.  Last hematology visit as follows: Impression and Plan:   1.    Pulmonary embolism diagnosed in February 2021.  He presented with small subsegmental PE provoked by immobilization after a fall while on oral contraceptives.   She has had completed more than 6 months of full dose anticoagulation.  Risks and benefits of discontinuation of Xarelto were discussed.  Given the fact that her thrombosis was provoked with immobilization as well as while on oral contraception, it is reasonable to discontinue anticoagulation at this time.  Repeat protein S panel showed no abnormalities to indicate a hypercoagulable state.  She understands if she develops thrombosis in the future that is unprovoked,  long-term anticoagulation may be needed.   2.  Follow-up:  Will be as needed in the future.     30  minutes were spent on this encounter.  Time was dedicated to reviewing laboratory data, discussing treatment options and future plan of care reviewed.       Elizabeth HoseFiras Shadad, MD 10/22/20211:24 PM Recently saw gynecologist.  Started on Celexa for depression and anxiety.  Presently taking 30 mg daily.  Doing well.  Much improved.  Celexa working well for her. Had abnormal Pap smear with positive HPV.  Scheduled for colposcopy. Blood work  from 11/01/2020 results reviewed with patient.  Normal CMP and CBC.  Normal TSH and hemoglobin A1c.  Unremarkable lipid profile. Recent GYN evaluation as follows: GYNECOLOGY  VISIT from yesterday   HPI: 30 y.o.   Married Black or [place] Not Hispanic or Latino  female   G0P0000 with Patient's last menstrual period was 11/15/2020.   here for f/u on Celexa, she was started on Celexa last month for depression and anxiety. The patient states that Celexa has helped with her anxiety and she doesn't have the feeling of not wanting to get up anymore. She feels more productive. Making an effort to get out and do things. Feels she is 70% better. Not crying any more.  No current side effects from the medications.  She is interested in therapy, hasn't found a therapist yet.  She and her husband are working on their marriage, not intimate at this time.    1. Depression with anxiety Anxiety is better, depression is 70% better. Will increase her Celexa to 30 mg a day and f/u in one month - citalopram (CELEXA) 10 MG tablet; Take 1 tablet (10 mg total) by mouth daily. Add to the 20 mg tablet for a total dose of 30 mg daily  Dispense: 30 tablet; Refill: 1   Doing well.  Today has no complaints or medical concerns.  HPI   Prior to Admission medications   Medication Sig Start Date End Date Taking? Authorizing Provider  citalopram (CELEXA) 10 MG tablet Take 1  tablet (10 mg total) by mouth daily. Add to the 20 mg tablet for a total dose of 30 mg daily 11/30/20  Yes Elizabeth Bolk, MD  citalopram (CELEXA) 20 MG tablet Take 1/2 tablet po qd for one week, if tolerating then increase to one tablet a day. 11/01/20  Yes Elizabeth Bolk, MD  levonorgestrel [facility]) 20 MCG/24HR IUD 1 each by Intrauterine route once.   Yes [provider]    Allergies  Allergen Reactions  . Lavender Oil Hives    Patient Active Problem List   Diagnosis Date Noted  . 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  . Acute pulmonary embolism (HCC) 09/18/2019  . Pulmonary embolism on long-term anticoagulation therapy (HCC) 09/18/2019  . 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 Cerebral palsy (HCC)   . Right hemiplegia (HCC) 10/17/2017  . History of cerebral palsy     Past Medical History:  Diagnosis Date  . Cerebral palsy (HCC)   . Dysmenorrhea   . History of abnormal cervical Pap smear    + HPV  . History of cerebral palsy   . History of pulmonary embolism   . 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 COLPOSCOPY      Social History   Socioeconomic History  . Marital status: Married    Spouse name: Not on file  . Number of children: Not on file  . Years of education: Not on file  . Highest education level: Not on file  Occupational History  . Not on file  Tobacco Use  . Smoking status: Never Smoker  . Smokeless tobacco: Never Used  Vaping Use  . Vaping Use: Never used  Substance and Sexual Activity  . Alcohol use: Yes    Alcohol/week: 1.0 standard drink    Types: 1 Standard drinks or equivalent per week    Comment: wine  . Drug use: No  . Sexual activity: Yes    Partners: Male    Birth control/protection: Pill  Other Topics Concern  . Not on file  Social History Narrative  . Not on file   Social Determinants of Health   Financial Resource Strain: Not on file  Food Insecurity: Not on file  Transportation Needs: Not on file  Physical Activity: Not on file  Stress: Not on file  Social Connections: Not on file  Intimate Partner Violence: Not on file    Family History  Problem Relation Age of Onset  . Hypertension Mother   . Diabetes Mother   . Hypertension Maternal Grandmother   . Cancer Paternal Grandfather        skin     Review of Systems  Constitutional: Negative.  Negative for fever.  HENT:  Negative.  Negative for congestion and sore throat.   Respiratory: Negative.  Negative for cough and shortness of breath.   Cardiovascular: Negative.  Negative for chest pain and palpitations.  Gastrointestinal: Negative.  Negative for abdominal pain, diarrhea, nausea and vomiting.  Genitourinary: Negative.  Negative for dysuria and hematuria.  Skin: Negative.  Negative for rash.  Neurological: Negative.  Negative for dizziness and headaches.  All other systems reviewed and are negative.   Today's Vitals   12/01/20 0800  BP: 120/82  Pulse: 98  Temp: 98.1 F (36.7 C)  TempSrc: Oral  SpO2: 98%  Weight:  215 lb (97.5 kg)  Height: 5\' 4"  (1.626 m)   Body mass index is 36.9 kg/m. Wt Readings from Last 3 Encounters:  12/01/20 215 lb (97.5 kg)  11/30/20 213 lb (96.6 kg)  11/01/20 215 lb (97.5 kg)    Physical Exam Vitals reviewed.  Constitutional:      Appearance: Normal appearance.  HENT:     Head: Normocephalic.     Right Ear: Tympanic membrane, ear canal and external ear normal.     Left Ear: Tympanic membrane, ear canal and external ear normal.     Mouth/Throat:     Mouth: Mucous membranes are moist.     Pharynx: Oropharynx is clear.  Eyes:     Extraocular Movements: Extraocular movements intact.     Conjunctiva/sclera: Conjunctivae normal.     Pupils: Pupils are equal, round, and reactive to light.  Cardiovascular:     Rate and Rhythm: Normal rate and regular rhythm.     Pulses: Normal pulses.     Heart sounds: Normal heart sounds.  Pulmonary:     Effort: Pulmonary effort is normal.     Breath sounds: Normal breath sounds.  Abdominal:     General: Bowel sounds are normal. There is no distension.     Palpations: Abdomen is soft.     Tenderness: There is no abdominal tenderness.  Musculoskeletal:        General: Normal range of motion.     Cervical back: Normal range of motion and neck supple.  Skin:    General: Skin is warm and dry.     Capillary Refill:  Capillary refill takes less than 2 seconds.  Neurological:     General: No focal deficit present.     Mental Status: She is alert and oriented to person, place, and time.  Psychiatric:        Mood and Affect: Mood normal.        Behavior: Behavior normal.      ASSESSMENT & PLAN: Marysol was seen today for annual exam.  Diagnoses and all orders for this visit:  Routine general medical examination at a health care facility  Other chronic pulmonary embolism with acute cor pulmonale (HCC)  Right hemiplegia (HCC)  Cerebral palsy, unspecified type (HCC)  Body mass index (BMI) of 36.0-36.9 in adult   Modifiable risk factors discussed with patient. Anticipatory guidance according to her age provided. The following topics were discussed: Smoking Diet and nutrition Benefits of exercise Cancer screening and review of family history Vaccinations including COVID Cardiovascular risk assessment Mental health including depression and anxiety and present use of Celexa Fall and accident prevention   Patient Instructions   Health Maintenance, Female Adopting a healthy lifestyle and getting preventive care are important in promoting health and wellness. Ask your health care provider about:  The right schedule for you to have regular tests and exams.  Things you can do on your own to prevent diseases and keep yourself healthy. What should I know about diet, weight, and exercise? Eat a healthy diet  Eat a diet that includes plenty of vegetables, fruits, low-fat dairy products, and lean protein.  Do not eat a lot of foods that are high in solid fats, added sugars, or sodium.   Maintain a healthy weight Body mass index (BMI) is used to identify weight problems. It estimates body fat based on height and weight. Your health care provider can help determine your BMI and help you achieve or maintain a healthy weight. Get regular  exercise Get regular exercise. This is one of the most  important things you can do for your health. Most adults should:  Exercise for at least 150 minutes each week. The exercise should increase your heart rate and make you sweat (moderate-intensity exercise).  Do strengthening exercises at least twice a week. This is in addition to the moderate-intensity exercise.  Spend less time sitting. Even light physical activity can be beneficial. Watch cholesterol and blood lipids Have your blood tested for lipids and cholesterol at 30 years of age, then have this test every 5 years. Have your cholesterol levels checked more often if:  Your lipid or cholesterol levels are high.  You are older than 29 years of age.  You are at high risk for heart disease. What should I know about cancer screening? Depending on your health history and family history, you may need to have cancer screening at various ages. This may include screening for:  Breast cancer.  Cervical cancer.  Colorectal cancer.  Skin cancer.  Lung cancer. What should I know about heart disease, diabetes, and high blood pressure? Blood pressure and heart disease  High blood pressure causes heart disease and increases the risk of stroke. This is more likely to develop in people who have high blood pressure readings, are of African descent, or are overweight.  Have your blood pressure checked: ? Every 3-5 years if you are 58-61 years of age. ? Every year if you are 29 years old or older. Diabetes Have regular diabetes screenings. This checks your fasting blood sugar level. Have the screening done:  Once every three years after age 63 if you are at a normal weight and have a low risk for diabetes.  More often and at a younger age if you are overweight or have a high risk for diabetes. What should I know about preventing infection? Hepatitis B If you have a higher risk for hepatitis B, you should be screened for this virus. Talk with your health care provider to find out if you are  at risk for hepatitis B infection. Hepatitis C Testing is recommended for:  Everyone born from [birth year] through 1965.  Anyone with known risk factors for hepatitis C. Sexually transmitted infections (STIs)  Get screened for STIs, including gonorrhea and chlamydia, if: ? You are sexually active and are younger than 30 years of age. ? You are older than 30 years of age and your health care provider tells you that you are at risk for this type of infection. ? Your sexual activity has changed since you were last screened, and you are at increased risk for chlamydia or gonorrhea. Ask your health care provider if you are at risk.  Ask your health care provider about whether you are at high risk for HIV. Your health care provider may recommend a prescription medicine to help prevent HIV infection. If you choose to take medicine to prevent HIV, you should first get tested for HIV. You should then be tested every 3 months for as long as you are taking the medicine. Pregnancy  If you are about to stop having your period (premenopausal) and you may become pregnant, seek counseling before you get pregnant.  Take 400 to 800 micrograms (mcg) of folic acid every day if you become pregnant.  Ask for birth control (contraception) if you want to prevent pregnancy. Osteoporosis and menopause Osteoporosis is a disease in which the bones lose minerals and strength with aging. This can result in bone fractures. If  you are 65 years old or older, or if you are at risk for osteoporosis and fractures, ask your health care provider if you should:  Be screened for bone loss.  Take a calcium or vitamin D supplement to lower your risk of fractures.  Be given hormone replacement therapy (HRT) to treat symptoms of menopause. Follow these instructions at home: Lifestyle  Do not use any products that contain nicotine or tobacco, such as cigarettes, e-cigarettes, and chewing tobacco. If you need help quitting, ask your  health care provider.  Do not use street drugs.  Do not share needles.  Ask your health care provider for help if you need support or information about quitting drugs. Alcohol use  Do not drink alcohol if: ? Your health care provider tells you not to drink. ? You are pregnant, may be pregnant, or are planning to become pregnant.  If you drink alcohol: ? Limit how much you use to 0-1 drink a day. ? Limit intake if you are breastfeeding.  Be aware of how much alcohol is in your drink. In the [place] one drink equals one 12 oz bottle of beer (355 mL), one 5 oz glass of wine (148 mL), or one 1 oz glass of hard liquor (44 mL). General instructions  Schedule regular health, dental, and eye exams.  Stay current with your vaccines.  Tell your health care provider if: ? You often feel depressed. ? You have ever been abused or do not feel safe at home. Summary  Adopting a healthy lifestyle and getting preventive care are important in promoting health and wellness.  Follow your health care provider's instructions about healthy diet, exercising, and getting tested or screened for diseases.  Follow your health care provider's instructions on monitoring your cholesterol and blood pressure. This information is not intended to replace advice given to you by your health care provider. Make sure you discuss any questions you have with your health care provider. Document Revised: 07/03/2018 Document Reviewed: 07/03/2018 Elsevier Patient Education  2021 Elsevier Inc.     Edwina Barth, MD [facility]

## 2020-12-08 ENCOUNTER — Encounter: Payer: Self-pay | Admitting: Obstetrics and Gynecology

## 2020-12-08 ENCOUNTER — Ambulatory Visit: Payer: BC Managed Care – PPO | Admitting: Obstetrics and Gynecology

## 2020-12-08 ENCOUNTER — Other Ambulatory Visit (HOSPITAL_COMMUNITY)
Admission: RE | Admit: 2020-12-08 | Discharge: 2020-12-08 | Disposition: A | Discharge status: Home / Self Care | Payer: BC Managed Care – PPO | Source: Ambulatory Visit | Attending: Obstetrics and Gynecology | Admitting: Obstetrics and Gynecology

## 2020-12-08 ENCOUNTER — Other Ambulatory Visit: Payer: Self-pay

## 2020-12-08 VITALS — BP 118/70 | HR 75 | Ht 64.0 in | Wt 215.0 lb

## 2020-12-08 DIAGNOSIS — R87810 Cervical high risk human papillomavirus (HPV) DNA test positive: Secondary | ICD-10-CM | POA: Insufficient documentation

## 2020-12-08 DIAGNOSIS — R87610 Atypical squamous cells of undetermined significance on cytologic smear of cervix (ASC-US): Secondary | ICD-10-CM | POA: Diagnosis not present

## 2020-12-08 DIAGNOSIS — Z01812 Encounter for preprocedural laboratory examination: Secondary | ICD-10-CM

## 2020-12-08 LAB — PREGNANCY, URINE: Preg Test, Ur: NEGATIVE

## 2020-12-08 NOTE — Patient Instructions (Signed)

## 2020-12-08 NOTE — Addendum Note (Signed)
Addended by: Berna Spare A on: 12/08/2020 02:45 PM   Modules accepted: Orders

## 2020-12-08 NOTE — Progress Notes (Signed)
GYNECOLOGY  VISIT   HPI: 30 y.o.   Married Black or [place] American Not Hispanic or Latino  female   G0P0000 with Patient's last menstrual period was 11/15/2020.   here for Colposcopy for an ASCUS, +HPV pap. This is her first abnormal pap.   GYNECOLOGIC HISTORY: Patient's last menstrual period was 11/15/2020. Contraception: Mirena IUD Menopausal hormone therapy: none        OB History    Gravida  0   Para  0   Term  0   Preterm  0   AB  0   Living  0     SAB  0   IAB  0   Ectopic  0   Multiple  0   Live Births  0              Patient Active Problem List   Diagnosis Date Noted  . Other chronic pulmonary embolism with acute cor pulmonale (HCC) 12/01/2020  . 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  . Acute pulmonary embolism (HCC) 09/18/2019  . Pulmonary embolism on long-term anticoagulation therapy (HCC) 09/18/2019  . 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 Cerebral palsy (HCC)   . Right hemiplegia (HCC) 10/17/2017  . History of cerebral palsy     Past Medical History:  Diagnosis Date  . Cerebral palsy (HCC)   . Dysmenorrhea   . History of abnormal cervical Pap smear    + HPV  . History of cerebral palsy   . History of pulmonary embolism   . 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 COLPOSCOPY      Current Outpatient Medications  Medication Sig Dispense Refill  . citalopram (CELEXA) 10 MG tablet Take 1 tablet (10 mg total) by mouth daily. Add to the 20 mg tablet for a total dose of 30 mg daily 30 tablet 1  . citalopram (CELEXA) 20 MG tablet Take 1/2 tablet po qd for one week, if tolerating then increase to one tablet a day. 30 tablet 1  . levonorgestrel (MIRENA) 20 MCG/24HR IUD 1 each by Intrauterine route once.     No current facility-administered medications for this visit.     ALLERGIES: Lavender oil  Family History  Problem Relation Age of Onset  .  Hypertension Mother   . Diabetes Mother   . Hypertension Maternal Grandmother   . Cancer Paternal Grandfather        skin    Social History   Socioeconomic History  . Marital status: Married    Spouse name: Not on file  . Number of children: Not on file  . Years of education: Not on file  . Highest education level: Not on file  Occupational History  . Not on file  Tobacco Use  . Smoking status: Never Smoker  . Smokeless tobacco: Never Used  Vaping Use  . Vaping Use: Never used  Substance and Sexual Activity  . Alcohol use: Yes    Alcohol/week: 1.0 standard drink    Types: 1 Standard drinks or equivalent per week    Comment: wine  . Drug use: No  . Sexual activity: Yes    Partners: Male    Birth control/protection: Pill  Other Topics Concern  . Not on file  Social History Narrative  . Not on file   Social Determinants of Health   Financial Resource Strain: Not on file  Food Insecurity:  Not on file  Transportation Needs: Not on file  Physical Activity: Not on file  Stress: Not on file  Social Connections: Not on file  Intimate Partner Violence: Not on file    Review of Systems  Constitutional: Negative.   HENT: Negative.   Eyes: Negative.   Respiratory: Negative.   Cardiovascular: Negative.   Gastrointestinal: Negative.   Genitourinary: Negative.   Musculoskeletal: Negative.   Skin: Negative.   Neurological: Negative.   Endo/Heme/Allergies: Negative.   Psychiatric/Behavioral: Negative.     PHYSICAL EXAMINATION:    LMP 11/15/2020     General appearance: alert, cooperative and appears stated age  Pelvic: External genitalia:  no lesions              Urethra:  normal appearing urethra with no masses, tenderness or lesions              Bartholins and Skenes: normal                 Vagina: normal appearing vagina with normal color and discharge, no lesions              Cervix: no gross lesions, IUD string 3 cm  Colposcopy: satisfactory, large  transformation zone. Aceto white changes at 4 and 11 o'clock, biopsies taken from each area. ECC done. Biopsy sites treated with silver nitrate. Negative lugols examination of the upper vagina  Chaperone was present for exam.  1. ASCUS with positive high risk HPV cervical - Colposcopy, satisfactory with large TZ - Surgical pathology( [place]/ [facility])

## 2020-12-10 LAB — SURGICAL PATHOLOGY

## 2020-12-22 ENCOUNTER — Other Ambulatory Visit: Payer: Self-pay | Admitting: Obstetrics and Gynecology

## 2020-12-22 DIAGNOSIS — F418 Other specified anxiety disorders: Secondary | ICD-10-CM

## 2020-12-22 NOTE — Telephone Encounter (Signed)
Pharmacy requesting 90 day supply and DX code. Entered F41.8 depression with Anxiety. Please advise.

## 2020-12-22 NOTE — Telephone Encounter (Signed)
She has an appointment later this month to f/u on her medication. Will call in one more month only. I'm not sure what dose she will need.

## 2020-12-23 ENCOUNTER — Other Ambulatory Visit: Payer: Self-pay | Admitting: Obstetrics and Gynecology

## 2020-12-23 DIAGNOSIS — F418 Other specified anxiety disorders: Secondary | ICD-10-CM

## 2021-01-17 ENCOUNTER — Encounter: Payer: Self-pay | Admitting: Obstetrics and Gynecology

## 2021-01-17 ENCOUNTER — Ambulatory Visit: Payer: BC Managed Care – PPO | Admitting: Obstetrics and Gynecology

## 2021-01-17 ENCOUNTER — Other Ambulatory Visit: Payer: Self-pay

## 2021-01-17 DIAGNOSIS — F418 Other specified anxiety disorders: Secondary | ICD-10-CM | POA: Diagnosis not present

## 2021-01-17 MED ORDER — CITALOPRAM HYDROBROMIDE 10 MG PO TABS: 10.0000 mg | ORAL_TABLET | Freq: Every day | ORAL | 3 refills | Status: DC

## 2021-01-17 MED ORDER — CITALOPRAM HYDROBROMIDE 20 MG PO TABS: ORAL_TABLET | ORAL | 3 refills | Status: DC

## 2021-01-17 NOTE — Progress Notes (Signed)
GYNECOLOGY  VISIT   HPI: 30 y.o.   Married Black or [place] American Not Hispanic or Latino  female   G0P0000 with Patient's last menstrual period was 01/16/2021.   here for  follow up for celexa. She says that she is not as tired and feels like she is having less negative thoughts. Last month the patient's Celexa was increased from 20 mg to 30 mg. At that time she felt her anxiety was better and her depression was 70% better.  On the 30 mg dose of Celexa she is feeling good. Mind is clear, sleeping better, more productive, no more negative effects. No side effects. Feels she is back to her old self.   She and her husband are doing better.   GYNECOLOGIC HISTORY: Patient's last menstrual period was 01/16/2021. 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 Add to the 20 mg tablet for a total dose of 30  mg daily 30 tablet 1   citalopram (CELEXA) 20 MG tablet TAKE 1/2 TABLET BY MOUTH EVERY DAY FOR 7 DAYS, IF TOLERATING THEN INCREASE TO 1 TABLET DAILY 30 tablet 0   levonorgestrel (MIRENA) 20 MCG/24HR IUD 1 each by Intrauterine route once.     No current facility-administered medications for this visit.     ALLERGIES: Lavender oil  Family History  Problem Relation Age of Onset   Hypertension Mother    Diabetes Mother    Hypertension Maternal Grandmother    Cancer Paternal Grandfather        skin    Social History   Socioeconomic History   Marital status: Married    Spouse name: Not on file   Number of children: Not on file   Years of education: Not on file   Highest education level: Not on file  Occupational History   Not on file  Tobacco Use   Smoking status: Never   Smokeless tobacco: Never  Vaping Use   Vaping Use: Never used  Substance and Sexual Activity   Alcohol  use: Yes    Alcohol/week: 1.0 standard drink    Types: 1 Standard drinks or equivalent per week    Comment: wine   Drug use: No   Sexual activity: Yes    Partners: Male    Birth control/protection: Pill  Other Topics Concern   Not on file  Social History Narrative   Not on file   Social Determinants of Health   Financial Resource Strain: Not on file  Food Insecurity: Not on file  Transportation Needs: Not on file  Physical Activity: Not on file  Stress: Not on file  Social Connections: Not on file  Intimate Partner Violence: Not on file    Review of Systems  Psychiatric/Behavioral:  Positive for depression. The patient is nervous/anxious.   All other systems reviewed and are negative.  PHYSICAL EXAMINATION:    BP 122/74   Pulse 66   Ht 5\' 4"  (1.626 m)   Wt 215 lb (97.5 kg)   LMP 01/16/2021   SpO2 100%   BMI 36.90 kg/m     General appearance: alert, cooperative and appears stated age  36. Depression with anxiety She is doing great on 30 mg of Celexa daily.  - citalopram (CELEXA)  20 MG tablet; Take one tablet a day. Add to the 10 mg tablet for a total dose of 30 mg a day.  Dispense: 90 tablet; Refill: 3 - citalopram (CELEXA) 10 MG tablet; Take 1 tablet (10 mg total) by mouth daily. Add to the 20 mg tablet for a total dose of 30 mg daily  Dispense: 90 tablet; Refill: 3

## 2021-03-30 ENCOUNTER — Encounter: Payer: Self-pay | Admitting: Emergency Medicine

## 2021-03-30 NOTE — Telephone Encounter (Signed)
Go to urgent care center.

## 2021-08-26 ENCOUNTER — Other Ambulatory Visit: Payer: Self-pay

## 2021-08-26 ENCOUNTER — Ambulatory Visit
Admission: RE | Admit: 2021-08-26 | Discharge: 2021-08-26 | Disposition: A | Discharge status: Home / Self Care | Payer: BC Managed Care – PPO | Source: Ambulatory Visit | Attending: Physician Assistant | Admitting: Physician Assistant

## 2021-08-26 VITALS — BP 125/79 | HR 93 | Temp 97.6°F | Resp 18

## 2021-08-26 DIAGNOSIS — J029 Acute pharyngitis, unspecified: Secondary | ICD-10-CM

## 2021-08-26 LAB — POCT RAPID STREP A (OFFICE): Rapid Strep A Screen: NEGATIVE

## 2021-08-26 MED ORDER — AMOXICILLIN 500 MG PO CAPS: 500.0000 mg | ORAL_CAPSULE | Freq: Three times a day (TID) | ORAL | 0 refills | Status: DC

## 2021-08-26 NOTE — ED Triage Notes (Signed)
4 day h/o sore throat. Pt reports that last night she noticed white spots in the back of her mouth and a new onset of right ear pressure. Onset today of cough. Has been taking cold & flu otc meds with temporary relief. Confirms body aches at the onset. Denies v/d.

## 2021-08-26 NOTE — ED Provider Notes (Addendum)
[facility]    CSN: [id number] Arrival date & time: 08/26/21  0941      History   Chief Complaint Chief Complaint  Patient presents with   10a appt   Sore Throat    HPI Elizabeth Stanton is a 31 y.o. female.   Patient here today for evaluation of sore throat that started 4 days ago. She reports that last night she noted white spots on the back of her mouth and she has also developed right ear pressure. Today she started to have some cough. She has tried OTC meds with mild relief.   The history is provided by the patient.  Sore Throat Pertinent negatives include no abdominal pain and no shortness of breath.   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 Authorizing Provider  amoxicillin (AMOXIL) 500 MG capsule Take 1 capsule (500 mg total) by mouth 3 (three) times daily. 08/26/21  Yes Tomi Bamberger, PA-C  citalopram (CELEXA) 10 MG tablet Take 1 tablet (10 mg total) by mouth daily.  Add to the 20 mg tablet for a total dose of 30 mg daily 01/17/21   Romualdo Bolk, MD  citalopram (CELEXA) 20 MG tablet Take one tablet a day. Add to the 10 mg tablet for a total dose of 30 mg a day. 01/17/21   Romualdo Bolk, MD  levonorgestrel (MIRENA) 20 MCG/24HR IUD 1 each by Intrauterine route once.    [provider]    Family History Family History  Problem Relation Age of Onset   Hypertension Mother    Diabetes Mother    Hypertension Maternal Grandmother    Cancer Paternal Grandfather        skin    Social History Social History   Tobacco Use   Smoking status: Never   Smokeless tobacco: Never  Vaping Use   Vaping Use: Never used  Substance Use Topics   Alcohol use: Yes    Alcohol/week: 1.0 standard drink    Types: 1 Standard drinks or equivalent per week    Comment: wine   Drug use: No     Allergies  Lavender oil   Review of Systems Review of Systems  Constitutional:  Negative for chills and fever.  HENT:  Positive for congestion, ear pain and sore throat.   Eyes:  Negative for discharge and redness.  Respiratory:  Positive for cough. Negative for shortness of breath and wheezing.   Gastrointestinal:  Negative for abdominal pain, diarrhea, nausea and vomiting.  Musculoskeletal:  Positive for myalgias.    Physical Exam Triage Vital Signs ED Triage Vitals  Enc Vitals Group     BP 08/26/21 1003 125/79     Pulse Rate 08/26/21 1003 93     Resp 08/26/21 1003 18     Temp 08/26/21 1003 97.6 F (36.4 C)     Temp Source 08/26/21 1003 Oral     SpO2 08/26/21 1003 99 %     Weight --      Height --      Head Circumference --      Peak Flow --      Pain Score 08/26/21 1006 8     Pain Loc --      Pain Edu? --      Excl. in GC? --    No data found.  Updated Vital Signs BP 125/79 (BP Location: Left Arm)    Pulse 93    Temp 97.6 F (36.4 C) (Oral)    Resp 18    SpO2 99%    Physical Exam Vitals and nursing note reviewed.   Constitutional:      General: She is not in acute distress.    Appearance: Normal appearance. She is not ill-appearing.  HENT:     Head: Normocephalic and atraumatic.     Right Ear: Tympanic membrane normal.     Left Ear: Tympanic membrane normal.     Nose: Congestion present.     Mouth/Throat:     Mouth: Mucous membranes are moist.     Pharynx: Oropharyngeal exudate and posterior oropharyngeal erythema present.  Eyes:     Conjunctiva/sclera: Conjunctivae normal.  Cardiovascular:     Rate and Rhythm: Normal rate and regular rhythm.     Heart sounds: Normal heart sounds. No murmur heard. Pulmonary:     Effort: Pulmonary effort is normal. No respiratory distress.     Breath sounds: Normal breath sounds. No wheezing, rhonchi or rales.  Skin:    General: Skin is warm and dry.  Neurological:     Mental Status: She is alert.  Psychiatric:        Mood and Affect: Mood normal.        Thought Content: Thought content normal.     UC Treatments / Results  Labs (all labs ordered are listed, but only abnormal results are displayed) Labs Reviewed  CULTURE, GROUP A STREP ([facility])  NOVEL CORONAVIRUS, NAA  POCT RAPID STREP A (OFFICE)    EKG   Radiology No results found.  Procedures Procedures (including critical care time)  Medications Ordered in UC Medications - No data to display  Initial Impression / Assessment and Plan / UC Course  I have reviewed the triage vital signs and the nursing notes.  Pertinent labs & imaging results that were available during my care of the patient were reviewed by me and considered in my medical decision making (see chart for details).    Strep test negative. Will treat with antibiotics given exudate and will screen for covid. Encouraged follow up if symptoms fail to improve or worsen in any way.   Final Clinical  Impressions(s) / UC Diagnoses   Final diagnoses:  Acute pharyngitis, unspecified etiology   Discharge Instructions   None     ED Prescriptions     Medication Sig Dispense Auth. Provider   amoxicillin (AMOXIL) 500 MG capsule Take 1 capsule (500 mg total) by mouth 3 (three) times daily. 21 capsule Tomi BambergerMyers, Latifah Padin F, PA-C      PDMP not reviewed this encounter.   Tomi BambergerMyers, Mckala Pantaleon F, PA-C 08/26/21 1221    Tomi BambergerMyers, Johnchristopher Sarvis F, PA-C 08/26/21 1228

## 2021-08-27 LAB — NOVEL CORONAVIRUS, NAA: SARS-CoV-2, NAA: NOT DETECTED

## 2021-08-27 LAB — SARS-COV-2, NAA 2 DAY TAT

## 2021-08-29 LAB — CULTURE, GROUP A STREP (THRC)

## 2021-11-02 ENCOUNTER — Ambulatory Visit: Payer: BC Managed Care – PPO | Admitting: Obstetrics and Gynecology

## 2021-12-05 ENCOUNTER — Ambulatory Visit
Admission: RE | Admit: 2021-12-05 | Discharge: 2021-12-05 | Disposition: A | Discharge status: Home / Self Care | Payer: BC Managed Care – PPO | Source: Ambulatory Visit | Attending: Family Medicine | Admitting: Family Medicine

## 2021-12-05 ENCOUNTER — Ambulatory Visit (INDEPENDENT_AMBULATORY_CARE_PROVIDER_SITE_OTHER): Payer: BC Managed Care – PPO

## 2021-12-05 VITALS — BP 113/82 | HR 95 | Temp 98.0°F | Resp 19

## 2021-12-05 DIAGNOSIS — M25571 Pain in right ankle and joints of right foot: Secondary | ICD-10-CM | POA: Diagnosis not present

## 2021-12-05 DIAGNOSIS — W19XXXA Unspecified fall, initial encounter: Secondary | ICD-10-CM

## 2021-12-05 MED ORDER — KETOROLAC TROMETHAMINE 30 MG/ML IJ SOLN
30.0000 mg | Freq: Once | INTRAMUSCULAR | Status: AC
  Administered 2021-12-05: 30 mg via INTRAMUSCULAR

## 2021-12-05 MED ORDER — ACETAMINOPHEN 325 MG PO TABS
975.0000 mg | ORAL_TABLET | Freq: Once | ORAL | Status: AC
  Administered 2021-12-05: 975 mg via ORAL

## 2021-12-05 MED ORDER — IBUPROFEN 800 MG PO TABS: 800.0000 mg | ORAL_TABLET | Freq: Three times a day (TID) | ORAL | 0 refills | Status: DC | PRN

## 2021-12-05 NOTE — ED Provider Notes (Signed)
?[facility] ? ? ? ?CSN: [id number] ?Arrival date & time: 12/05/21  0848 ? ? ?  ? ?History   ?Chief Complaint ?Chief Complaint  ?Patient presents with  ? Ankle Pain  ?  I tripped Saturday night and I believed I twisted my ankle. I heard a pop. I elevated it and applied heat and ice. I can not bear weight on my foot. - Entered by patient  ? Appointment  ?  9  ? ? ?HPI ?Foster SimpsonCaptoria Ritchey is a 31 y.o. female.  ? ? ?Ankle Pain ?Here for right ankle and distal foot pain. ? ?On the evening of the day she was chaperoning problem, and slipped on a wet floor.  Her right ankle popped and she fell onto that foot and ankle.  At first it was mainly the right ankle that was hurting but now she is hurting more to her distal right foot also. ? ?Medical history significant for CP. ? ?Past Medical History:  ?Diagnosis Date  ? Cerebral palsy (HCC)   ? Dysmenorrhea   ? History of abnormal cervical Pap smear   ? + HPV  ? History of cerebral palsy   ? History of pulmonary embolism   ? 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 ? ?Patient Active Problem List  ? Diagnosis Date Noted  ? 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 Acute pulmonary embolism (HCC) 09/18/2019  ? Pulmonary embolism on long-term anticoagulation therapy (HCC) 09/18/2019  ? 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 Cerebral palsy (HCC)   ? Right hemiplegia (HCC) 10/17/2017  ? History of cerebral palsy   ? ? ?Past Surgical History:  ?Procedure Laterality Date  ? COLPOSCOPY    ? ? ?OB History   ? ? Gravida  ?0  ? Para  ?0  ? Term  ?0  ? Preterm  ?0  ? AB  ?0  ? Living  ?0  ?  ? ? SAB  ?0  ? IAB  ?0  ? Ectopic  ?0  ? Multiple  ?0  ? Live Births  ?0  ?   ?  ?  ? ? ? ?Home Medications   ? ?Prior to Admission medications   ?Medication Sig Start Date End Date Taking? Authorizing Provider  ?ibuprofen (ADVIL) 800 MG tablet Take 1 tablet (800 mg total) by mouth every 8  (eight) hours as needed (pain). 12/05/21  Yes Zenia ResidesBanister, Kiara Mcdowell K, MD  ?citalopram (CELEXA) 10 MG tablet Take 1 tablet (10 mg total) by mouth daily. Add to the 20 mg tablet for a total dose of 30 mg daily 01/17/21   Romualdo BolkJertson, Jill Evelyn, MD  ?citalopram (CELEXA) 20 MG tablet Take one tablet a day. Add to the 10 mg tablet for a total dose of 30 mg a day. 01/17/21   Romualdo BolkJertson, Jill Evelyn, MD  ?levonorgestrel (MIRENA) 20 MCG/24HR IUD 1 each by Intrauterine route once.    [provider]  ? ? ?Family History ?Family History  ?Problem Relation Age of Onset  ? Hypertension Mother   ? Diabetes Mother   ? Hypertension Maternal Grandmother   ? Cancer Paternal Grandfather   ?     skin  ? ? ?Social History ?Social History  ? ?Tobacco Use  ? Smoking status: Never  ? Smokeless tobacco: Never  ?Vaping Use  ? Vaping Use: Never used  ?Substance Use  Topics  ? Alcohol use: Yes  ?  Alcohol/week: 1.0 standard drink  ?  Types: 1 Standard drinks or equivalent per week  ?  Comment: wine  ? Drug use: No  ? ? ? ?Allergies   ?Lavender oil ? ? ?Review of Systems ?Review of Systems ? ? ?Physical Exam ?Triage Vital Signs ?ED Triage Vitals  ?Enc Vitals Group  ?   BP 12/05/21 0902 113/82  ?   Pulse Rate 12/05/21 0902 95  ?   Resp 12/05/21 0902 19  ?   Temp 12/05/21 0902 98 ?F (36.7 ?C)  ?   Temp src --   ?   SpO2 12/05/21 0902 95 %  ?   Weight --   ?   Height --   ?   Head Circumference --   ?   Peak Flow --   ?   Pain Score 12/05/21 0859 7  ?   Pain Loc --   ?   Pain Edu? --   ?   Excl. in GC? --   ? ?No data found. ? ?Updated Vital Signs ?BP 113/82   Pulse 95   Temp 98 ?F (36.7 ?C)   Resp 19   SpO2 95%  ? ?Visual Acuity ?Right Eye Distance:   ?Left Eye Distance:   ?Bilateral Distance:   ? ?Right Eye Near:   ?Left Eye Near:    ?Bilateral Near:    ? ?Physical Exam ?Vitals reviewed.  ?Constitutional:   ?   General: She is not in acute distress. ?   Appearance: She is not ill-appearing, toxic-appearing or diaphoretic.  ?Musculoskeletal:  ?    Comments: There is mild edema around the right lateral malleolus.  There is more swelling noted on the proximal right foot on the dorsum.  No erythema or ecchymosis present.  Is very tender over the distal  ?Skin: ?   Coloration: Skin is not pale.  ?Neurological:  ?   Mental Status: She is alert and oriented to person, place, and time.  ?Psychiatric:     ?   Behavior: Behavior normal.  ? ? ? ?UC Treatments / Results  ?Labs ?(all labs ordered are listed, but only abnormal results are displayed) ?Labs Reviewed - No data to display ? ?EKG ? ? ?Radiology ?DG Ankle Complete Right ? ?Result Date: 12/05/2021 ?CLINICAL DATA:  Right foot and ankle pain. Fall on wet floor 2 days ago. EXAM: RIGHT ANKLE - COMPLETE 3+ VIEW; RIGHT FOOT COMPLETE - 3+ VIEW COMPARISON:  None Available. FINDINGS: There is no evidence of fracture, dislocation, or joint effusion. There is no evidence of arthropathy or other focal bone abnormality. Soft tissues are unremarkable. IMPRESSION: Negative. Electronically Signed   By: Marin Roberts M.D.   On: 12/05/2021 09:26  ? ?DG Foot Complete Right ? ?Result Date: 12/05/2021 ?CLINICAL DATA:  Right foot and ankle pain. Fall on wet floor 2 days ago. EXAM: RIGHT ANKLE - COMPLETE 3+ VIEW; RIGHT FOOT COMPLETE - 3+ VIEW COMPARISON:  None Available. FINDINGS: There is no evidence of fracture, dislocation, or joint effusion. There is no evidence of arthropathy or other focal bone abnormality. Soft tissues are unremarkable. IMPRESSION: Negative. Electronically Signed   By: Marin Roberts M.D.   On: 12/05/2021 09:26   ? ?Procedures ?Procedures (including critical care time) ? ?Medications Ordered in UC ?Medications  ?ketorolac (TORADOL) 30 MG/ML injection 30 mg (has no administration in time range)  ?acetaminophen (TYLENOL) tablet 975 mg (975 mg Oral Given 12/05/21  2979)  ? ? ?Initial Impression / Assessment and Plan / UC Course  ?I have reviewed the triage vital signs and the nursing notes. ? ?Pertinent  labs & imaging results that were available during my care of the patient were reviewed by me and considered in my medical decision making (see chart for details). ? ?  ? ?X-rays do not show fracture of the ankle or foot. Contact info given for ortho for eval if not improving fairly quickly. ?Final Clinical Impressions(s) / UC Diagnoses  ? ?Final diagnoses:  ?Pain in joint involving right ankle and foot  ? ? ? ?Discharge Instructions   ? ?  ?Xrays did not show any broken bones ? ?You have been given a shot of Toradol 30 mg today.  ? ?Take ibuprofen 800 mg--1 tab every 8 hours as needed for pain.  ? ?Ice and elevate your right foot/ankle ? ? ? ? ? ? ?ED Prescriptions   ? ? Medication Sig Dispense Auth. Provider  ? ibuprofen (ADVIL) 800 MG tablet Take 1 tablet (800 mg total) by mouth every 8 (eight) hours as needed (pain). 21 tablet Marlinda Mike Janace Aris, MD  ? ?  ? ?PDMP not reviewed this encounter. ?  ?Zenia Resides, MD ?12/05/21 0940 ? ?

## 2021-12-05 NOTE — Discharge Instructions (Addendum)
Xrays did not show any broken bones ? ?You have been given a shot of Toradol 30 mg today.  ? ?Take ibuprofen 800 mg--1 tab every 8 hours as needed for pain.  ? ?Ice and elevate your right foot/ankle ? ? ?

## 2021-12-05 NOTE — Progress Notes (Addendum)
31 y.o. G0P0000 Legally Separated Black or African American Not Hispanic or Latino female here for annual exam.   She has a mirena IUD, inserted in 5/21. She and her husband are taking a break for a year.  Monthly cycles for 5-7 days, saturates a super tampon 7 hours. Some mild premenstrual cramping.   She would like STD testing today due to having a new sexual partner. Using condoms.    Depression is better. She hasn't been taking it everyday, will be more consistent.   Patient's last menstrual period was 12/03/2021 (exact date).          Sexually active: Yes.    The current method of family planning is IUD.    Exercising: No.  The patient does not participate in regular exercise at present. Smoker:  no  Health Maintenance: Pap:  11/01/20 ASCUS/+HPV; 09/21/2017 WNL: 2017 WNL per patient  History of abnormal Pap:  yes, see above, negative biopsies.  MMG:  none  BMD:   none  Colonoscopy: none  TDaP:  08/31/17  Gardasil: Complete    reports that she has never smoked. She has never used smokeless tobacco. She reports current alcohol use of about 1.0 standard drink per week. She reports that she does not use drugs. She is an Scientist, physiological.   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 Add to the 20 mg tablet for a total dose of 30 mg daily 90 tablet 3   citalopram (CELEXA) 20 MG tablet Take one tablet a day. Add to the 10 mg tablet for a total dose of 30 mg a day. 90 tablet 3   ibuprofen (ADVIL) 800 MG tablet Take 1 tablet (800 mg total) by mouth every 8 (eight) hours as  needed (pain). 21 tablet 0   levonorgestrel (MIRENA) 20 MCG/24HR IUD 1 each by Intrauterine route once.     No current facility-administered medications for this visit.    Family History  Problem Relation Age of Onset   Hypertension Mother    Diabetes Mother    Hypertension Maternal Grandmother    Cancer Paternal Grandfather        skin    Review of Systems  All other systems reviewed and are negative.  Exam:   BP 120/80   Pulse 73   Ht 5\' 5"  (1.651 m)   Wt 229 lb (103.9 kg)   LMP 12/03/2021 (Exact Date)   SpO2 99%   BMI 38.11 kg/m   Weight change: @WEIGHTCHANGE @ Height:   Height: 5\' 5"  (165.1 cm)  Ht Readings from Last 3 Encounters:  12/08/21 5\' 5"  (1.651 m)  12/06/21 5\' 4"  (1.626 m)  01/17/21 5\' 4"  (1.626 m)    General appearance: alert, cooperative and appears stated age Head: Normocephalic, without obvious abnormality, atraumatic Neck: no adenopathy, supple, symmetrical, trachea midline and thyroid normal to inspection and palpation Lungs: clear to auscultation bilaterally Cardiovascular: regular rate and rhythm Breasts: normal appearance, no masses or tenderness Abdomen: soft, non-tender; non  distended,  no masses,  no organomegaly Extremities: extremities normal, atraumatic, no cyanosis or edema Skin: Skin color, texture, turgor normal. No rashes or lesions Lymph nodes: Cervical, supraclavicular, and axillary nodes normal. No abnormal inguinal nodes palpated Neurologic: Grossly normal   Pelvic: External genitalia:  no lesions              Urethra:  normal appearing urethra with no masses, tenderness or lesions              Bartholins and Skenes: normal                 Vagina: normal appearing vagina with normal color and discharge, no lesions              Cervix: no lesions and friable with pap, IUD string 4 cm               Bimanual Exam:  Uterus:   no masses or tenderness              Adnexa: no mass, fullness, tenderness               Rectovaginal:  Confirms               Anus:  normal sphincter tone, no lesions  Gae Dry chaperoned for the exam.  1. Well woman exam Discussed breast self exam Discussed calcium and vit D intake  2. Screening for cervical cancer - Cytology - PAP  3. Depression with anxiety Doing well - citalopram (CELEXA) 10 MG tablet; Take 1 tablet (10 mg total) by mouth daily. Add to the 20 mg tablet for a total dose of 30 mg daily  Dispense: 90 tablet; Refill: 3 - citalopram (CELEXA) 20 MG tablet; Take one tablet a day. Add to the 10 mg tablet for a total dose of 30 mg a day.  Dispense: 90 tablet; Refill: 3  4. Screening examination for STD (sexually transmitted disease) - Cytology - PAP - RPR - HIV Antibody (routine testing w rflx)  5. IUD check up Doing well  After the exam she c/o occasional increase in vaginal d/c, no other symptoms. She will reach out if it is bothersome/worsening.   Addendum: At the time of her visit, she was noted to have an erythematous rash under her breasts. A script for nystatin has been sent.

## 2021-12-05 NOTE — ED Triage Notes (Signed)
Pt presents with complaints of right ankle pain. Reports slipping on a wet floor on Saturday night. Pt has tried RICE at home with no relief.  ?

## 2021-12-06 ENCOUNTER — Encounter: Payer: Self-pay | Admitting: Emergency Medicine

## 2021-12-06 ENCOUNTER — Ambulatory Visit (INDEPENDENT_AMBULATORY_CARE_PROVIDER_SITE_OTHER): Payer: BC Managed Care – PPO | Admitting: Emergency Medicine

## 2021-12-06 VITALS — BP 126/74 | HR 95 | Temp 98.4°F | Ht 64.0 in | Wt 226.2 lb

## 2021-12-06 DIAGNOSIS — Z0000 Encounter for general adult medical examination without abnormal findings: Secondary | ICD-10-CM | POA: Diagnosis not present

## 2021-12-06 DIAGNOSIS — Z1329 Encounter for screening for other suspected endocrine disorder: Secondary | ICD-10-CM | POA: Diagnosis not present

## 2021-12-06 DIAGNOSIS — Z1159 Encounter for screening for other viral diseases: Secondary | ICD-10-CM

## 2021-12-06 DIAGNOSIS — Z13220 Encounter for screening for lipoid disorders: Secondary | ICD-10-CM

## 2021-12-06 DIAGNOSIS — Z13228 Encounter for screening for other metabolic disorders: Secondary | ICD-10-CM

## 2021-12-06 DIAGNOSIS — Z8669 Personal history of other diseases of the nervous system and sense organs: Secondary | ICD-10-CM | POA: Diagnosis not present

## 2021-12-06 DIAGNOSIS — Z130 Encounter for screening for diseases of the blood and blood-forming organs and certain disorders involving the immune mechanism: Secondary | ICD-10-CM | POA: Diagnosis not present

## 2021-12-06 LAB — COMPREHENSIVE METABOLIC PANEL
ALT: 14 U/L (ref 0–35)
AST: 17 U/L (ref 0–37)
Albumin: 4 g/dL (ref 3.5–5.2)
Alkaline Phosphatase: 52 U/L (ref 39–117)
BUN: 11 mg/dL (ref 6–23)
CO2: 27 mEq/L (ref 19–32)
Calcium: 9.5 mg/dL (ref 8.4–10.5)
Chloride: 103 mEq/L (ref 96–112)
Creatinine, Ser: 0.81 mg/dL (ref 0.40–1.20)
GFR: 96.77 mL/min (ref >60.00)
Glucose, Bld: 87 mg/dL (ref 70–99)
Potassium: 4.1 mEq/L (ref 3.5–5.1)
Sodium: 136 mEq/L (ref 135–145)
Total Bilirubin: 0.4 mg/dL (ref 0.2–1.2)
Total Protein: 8 g/dL (ref 6.0–8.3)

## 2021-12-06 LAB — CBC WITH DIFFERENTIAL/PLATELET
Basophils Absolute: 0.1 10*3/uL (ref 0.0–0.1)
Basophils Relative: 0.7 % (ref 0.0–3.0)
Eosinophils Absolute: 0.2 10*3/uL (ref 0.0–0.7)
Eosinophils Relative: 2.7 % (ref 0.0–5.0)
HCT: 39 % (ref 36.0–46.0)
Hemoglobin: 13.1 g/dL (ref 12.0–15.0)
Lymphocytes Relative: 24.2 % (ref 12.0–46.0)
Lymphs Abs: 2.1 10*3/uL (ref 0.7–4.0)
MCHC: 33.6 g/dL (ref 30.0–36.0)
MCV: 92.8 fl (ref 78.0–100.0)
Monocytes Absolute: 0.7 10*3/uL (ref 0.1–1.0)
Monocytes Relative: 8.1 % (ref 3.0–12.0)
Neutro Abs: 5.5 10*3/uL (ref 1.4–7.7)
Neutrophils Relative %: 64.3 % (ref 43.0–77.0)
Platelets: 458 10*3/uL — ABNORMAL HIGH (ref 150.0–400.0)
RBC: 4.2 Mil/uL (ref 3.87–5.11)
RDW: 12.6 % (ref 11.5–15.5)
WBC: 8.5 10*3/uL (ref 4.0–10.5)

## 2021-12-06 LAB — LIPID PANEL
Cholesterol: 149 mg/dL (ref 0–200)
HDL: 50.7 mg/dL (ref >39.00)
LDL Cholesterol: 82 mg/dL (ref 0–99)
NonHDL: 98.41
Total CHOL/HDL Ratio: 3
Triglycerides: 84 mg/dL (ref 0.0–149.0)
VLDL: 16.8 mg/dL (ref 0.0–40.0)

## 2021-12-06 LAB — HEMOGLOBIN A1C: Hgb A1c MFr Bld: 5.4 % (ref 4.6–6.5)

## 2021-12-06 NOTE — Progress Notes (Signed)
Elizabeth Stanton ?31 y.o. ? ? ?Chief Complaint  ?Patient presents with  ? Annual Exam  ? ? ?HISTORY OF PRESENT ILLNESS: ?This is a 31 y.o. female here for annual exam. ?Doing well.  Has no complaints or medical concerns today. ?Has history of chronic depression on Celexa.  Doing well. ?Has history of cerebral palsy. ?Scheduled to see gynecologist later this week. ? ?HPI ? ? ?Prior to Admission medications   ?Medication Sig Start Date End Date Taking? Authorizing Provider  ?citalopram (CELEXA) 10 MG tablet Take 1 tablet (10 mg total) by mouth daily. Add to the 20 mg tablet for a total dose of 30 mg daily 01/17/21  Yes Romualdo BolkJertson, Jill Evelyn, MD  ?citalopram (CELEXA) 20 MG tablet Take one tablet a day. Add to the 10 mg tablet for a total dose of 30 mg a day. 01/17/21  Yes Romualdo BolkJertson, Jill Evelyn, MD  ?ibuprofen (ADVIL) 800 MG tablet Take 1 tablet (800 mg total) by mouth every 8 (eight) hours as needed (pain). 12/05/21  Yes Zenia ResidesBanister, Pamela K, MD  ?levonorgestrel (MIRENA) 20 MCG/24HR IUD 1 each by Intrauterine route once.   Yes [provider]  ? ? ?Allergies  ?Allergen Reactions  ? Lavender Oil Hives  ? ? ?Patient Active Problem List  ? Diagnosis Date Noted  ? 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 Acute pulmonary embolism (HCC) 09/18/2019  ? Pulmonary embolism on long-term anticoagulation therapy (HCC) 09/18/2019  ? 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 Cerebral palsy (HCC)   ? Right hemiplegia (HCC) 10/17/2017  ? History of cerebral palsy   ? ? ?Past Medical History:  ?Diagnosis Date  ? Cerebral palsy (HCC)   ? Dysmenorrhea   ? History of abnormal cervical Pap smear   ? + HPV  ? History of cerebral palsy   ? History of pulmonary embolism   ? 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 ? ?Past Surgical History:  ?Procedure Laterality Date  ? COLPOSCOPY    ? ? ?Social History  ? ?Socioeconomic History  ? Marital  status: Married  ?  Spouse name: Not on file  ? Number of children: Not on file  ? Years of education: Not on file  ? Highest education level: Not on file  ?Occupational History  ? Not on file  ?Tobacco Use  ? Smoking status: Never  ? Smokeless tobacco: Never  ?Vaping Use  ? Vaping Use: Never used  ?Substance and Sexual Activity  ? Alcohol use: Yes  ?  Alcohol/week: 1.0 standard drink  ?  Types: 1 Standard drinks or equivalent per week  ?  Comment: wine  ? Drug use: No  ? Sexual activity: Yes  ?  Partners: Male  ?  Birth control/protection: I.U.D.  ?Other Topics Concern  ? Not on file  ?Social History Narrative  ? Not on file  ? ?Social Determinants of Health  ? ?Financial Resource Strain: Not on file  ?Food Insecurity: Not on file  ?Transportation Needs: Not on file  ?Physical Activity: Not on file  ?Stress: Not on file  ?Social Connections: Not on file  ?Intimate Partner Violence: Not on file  ? ? ?Family History  ?Problem Relation Age of Onset  ? Hypertension Mother   ? Diabetes Mother   ? Hypertension Maternal Grandmother   ? Cancer Paternal Grandfather   ?     skin  ? ? ? ?  Review of Systems  ?Constitutional: Negative.  Negative for chills and fever.  ?HENT: Negative.  Negative for congestion and sore throat.   ?Respiratory: Negative.  Negative for cough and shortness of breath.   ?Cardiovascular: Negative.  Negative for chest pain and palpitations.  ?Gastrointestinal:  Negative for abdominal pain, diarrhea, nausea and vomiting.  ?Genitourinary: Negative.   ?Skin: Negative.  Negative for rash.  ?Neurological: Negative.  Negative for dizziness and headaches.  ?All other systems reviewed and are negative. ? ?Today's Vitals  ? 12/06/21 0802  ?BP: 126/74  ?Pulse: 95  ?Temp: 98.4 ?F (36.9 ?C)  ?SpO2: 92%  ?Weight: 226 lb 4 oz (102.6 kg)  ?Height: 5\' 4"  (1.626 m)  ? ?Body mass index is 38.84 kg/m?. ? ?Physical Exam ?Vitals reviewed.  ?Constitutional:   ?   Appearance: Normal appearance.  ?HENT:  ?   Head:  Normocephalic.  ?   Right Ear: Tympanic membrane, ear canal and external ear normal.  ?   Left Ear: Tympanic membrane, ear canal and external ear normal.  ?   Mouth/Throat:  ?   Mouth: Mucous membranes are moist.  ?   Pharynx: Oropharynx is clear.  ?Eyes:  ?   Extraocular Movements: Extraocular movements intact.  ?   Conjunctiva/sclera: Conjunctivae normal.  ?   Pupils: Pupils are equal, round, and reactive to light.  ?Cardiovascular:  ?   Rate and Rhythm: Normal rate and regular rhythm.  ?   Pulses: Normal pulses.  ?   Heart sounds: Normal heart sounds.  ?Pulmonary:  ?   Effort: Pulmonary effort is normal.  ?   Breath sounds: Normal breath sounds.  ?Abdominal:  ?   General: There is no distension.  ?   Palpations: Abdomen is soft.  ?   Tenderness: There is no abdominal tenderness.  ?Musculoskeletal:  ?   Cervical back: No tenderness.  ?   Comments: Right arm chronic CP limitations  ?Lymphadenopathy:  ?   Cervical: No cervical adenopathy.  ?Skin: ?   General: Skin is warm and dry.  ?   Capillary Refill: Capillary refill takes less than 2 seconds.  ?Neurological:  ?   General: No focal deficit present.  ?   Mental Status: She is alert and oriented to person, place, and time.  ?Psychiatric:     ?   Mood and Affect: Mood normal.     ?   Behavior: Behavior normal.  ? ? ? ?ASSESSMENT & PLAN: ?Problem List Items Addressed This Visit   ? ?  ? Other  ? History of cerebral palsy  ? ?Other Visit Diagnoses   ? ? Routine general medical examination at a health care facility    -  Primary  ? Need for hepatitis C screening test      ? Relevant Orders  ? Hepatitis C antibody screen  ? Screening for deficiency anemia      ? Relevant Orders  ? CBC with Differential  ? Screening for lipoid disorders      ? Relevant Orders  ? Lipid panel  ? Screening for endocrine, metabolic and immunity disorder      ? Relevant Orders  ? Comprehensive metabolic panel  ? Hemoglobin A1c  ? ?  ? ?Modifiable risk factors discussed with  patient. ?Anticipatory guidance according to age provided. ?The following topics were also discussed: ?Social Determinants of Health ?Smoking.  Non-smoker ?Diet and nutrition ?Benefits of exercise ?Cancer family history review ?Vaccinations recommendations ?Cardiovascular risk assessment and need  for blood work ?Mental health including depression and anxiety ?Fall and accident prevention ? ?Patient Instructions  ?Health Maintenance, Female ?Adopting a healthy lifestyle and getting preventive care are important in promoting health and wellness. Ask your health care provider about: ?The right schedule for you to have regular tests and exams. ?Things you can do on your own to prevent diseases and keep yourself healthy. ?What should I know about diet, weight, and exercise? ?Eat a healthy diet ? ?Eat a diet that includes plenty of vegetables, fruits, low-fat dairy products, and lean protein. ?Do not eat a lot of foods that are high in solid fats, added sugars, or sodium. ?Maintain a healthy weight ?Body mass index (BMI) is used to identify weight problems. It estimates body fat based on height and weight. Your health care provider can help determine your BMI and help you achieve or maintain a healthy weight. ?Get regular exercise ?Get regular exercise. This is one of the most important things you can do for your health. Most adults should: ?Exercise for at least 150 minutes each week. The exercise should increase your heart rate and make you sweat (moderate-intensity exercise). ?Do strengthening exercises at least twice a week. This is in addition to the moderate-intensity exercise. ?Spend less time sitting. Even light physical activity can be beneficial. ?Watch cholesterol and blood lipids ?Have your blood tested for lipids and cholesterol at 31 years of age, then have this test every 5 years. ?Have your cholesterol levels checked more often if: ?Your lipid or cholesterol levels are high. ?You are older than 31 years of  age. ?You are at high risk for heart disease. ?What should I know about cancer screening? ?Depending on your health history and family history, you may need to have cancer screening at various ages. This may include screening for

## 2021-12-06 NOTE — Patient Instructions (Signed)

## 2021-12-07 LAB — HEPATITIS C ANTIBODY
Hepatitis C Ab: NONREACTIVE
SIGNAL TO CUT-OFF: 0.26 (ref <1.00)

## 2021-12-08 ENCOUNTER — Ambulatory Visit (INDEPENDENT_AMBULATORY_CARE_PROVIDER_SITE_OTHER): Payer: BC Managed Care – PPO | Admitting: Obstetrics and Gynecology

## 2021-12-08 ENCOUNTER — Encounter: Payer: Self-pay | Admitting: Obstetrics and Gynecology

## 2021-12-08 ENCOUNTER — Other Ambulatory Visit (HOSPITAL_COMMUNITY)
Admission: RE | Admit: 2021-12-08 | Discharge: 2021-12-08 | Disposition: A | Discharge status: Home / Self Care | Payer: BC Managed Care – PPO | Source: Ambulatory Visit | Attending: Obstetrics and Gynecology | Admitting: Obstetrics and Gynecology

## 2021-12-08 VITALS — BP 120/80 | HR 73 | Ht 65.0 in | Wt 229.0 lb

## 2021-12-08 DIAGNOSIS — Z113 Encounter for screening for infections with a predominantly sexual mode of transmission: Secondary | ICD-10-CM | POA: Insufficient documentation

## 2021-12-08 DIAGNOSIS — Z30431 Encounter for routine checking of intrauterine contraceptive device: Secondary | ICD-10-CM | POA: Diagnosis not present

## 2021-12-08 DIAGNOSIS — F418 Other specified anxiety disorders: Secondary | ICD-10-CM

## 2021-12-08 DIAGNOSIS — Z124 Encounter for screening for malignant neoplasm of cervix: Secondary | ICD-10-CM | POA: Insufficient documentation

## 2021-12-08 DIAGNOSIS — Z01419 Encounter for gynecological examination (general) (routine) without abnormal findings: Secondary | ICD-10-CM | POA: Diagnosis not present

## 2021-12-08 MED ORDER — CITALOPRAM HYDROBROMIDE 10 MG PO TABS: 10.0000 mg | ORAL_TABLET | Freq: Every day | ORAL | 3 refills | Status: DC

## 2021-12-08 MED ORDER — CITALOPRAM HYDROBROMIDE 20 MG PO TABS: ORAL_TABLET | ORAL | 3 refills | Status: DC

## 2021-12-08 NOTE — Patient Instructions (Signed)

## 2021-12-09 LAB — HIV ANTIBODY (ROUTINE TESTING W REFLEX): HIV 1&2 Ab, 4th Generation: NONREACTIVE

## 2021-12-09 LAB — RPR: RPR Ser Ql: NONREACTIVE

## 2021-12-11 ENCOUNTER — Encounter: Payer: Self-pay | Admitting: Obstetrics and Gynecology

## 2021-12-12 ENCOUNTER — Other Ambulatory Visit: Payer: Self-pay | Admitting: Obstetrics and Gynecology

## 2021-12-12 MED ORDER — NYSTATIN 100000 UNIT/GM EX CREA: 1.0000 "application " | TOPICAL_CREAM | Freq: Two times a day (BID) | CUTANEOUS | 1 refills | Status: DC

## 2021-12-13 LAB — CYTOLOGY - PAP
Chlamydia: NEGATIVE
Comment: NEGATIVE
Comment: NEGATIVE
Comment: NEGATIVE
Comment: NORMAL
Diagnosis: NEGATIVE
High risk HPV: NEGATIVE
Neisseria Gonorrhea: NEGATIVE
Trichomonas: NEGATIVE

## 2022-06-12 ENCOUNTER — Ambulatory Visit: Payer: BC Managed Care – PPO | Admitting: Radiology

## 2022-06-12 VITALS — BP 118/78

## 2022-06-12 DIAGNOSIS — Z308 Encounter for other contraceptive management: Secondary | ICD-10-CM

## 2022-06-12 DIAGNOSIS — Z23 Encounter for immunization: Secondary | ICD-10-CM | POA: Diagnosis not present

## 2022-06-12 DIAGNOSIS — Z30431 Encounter for routine checking of intrauterine contraceptive device: Secondary | ICD-10-CM

## 2022-06-12 DIAGNOSIS — Z113 Encounter for screening for infections with a predominantly sexual mode of transmission: Secondary | ICD-10-CM

## 2022-06-12 MED ORDER — PHEXXI 1.8-1-0.4 % VA GEL: 5.0000 g | VAGINAL | 11 refills | Status: DC

## 2022-06-12 NOTE — Progress Notes (Signed)
      Subjective: Elizabeth Stanton is a 31 y.o. female here for STI screening. Has a new partner, would like IUD check, partner states he can feel a 'stabbing'. Interested in back up [facility], not a candidate for estrogen, hx of blood clot.   Review of Systems  All other systems reviewed and are negative.   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   -General: no acute distress -Vulva: without lesions or discharge -Vagina: discharge present, aptima swab obtained -Cervix: no lesion or discharge, no CMT -Perineum: no lesions -Uterus: Mobile, non tender -Adnexa: no masses or tenderness   Chaperone offered and declined.  Assessment:/Plan:   1. IUD check up Reassured strings 3cm from os, tucked to the side   2. Screening for STDs (sexually transmitted diseases) - SureSwab Advanced Vaginitis Plus,TMA - HIV antibody (with reflex) - RPR - Hepatitis C antibody - Hepatitis B Surface AntiGEN  3. Need for immunization against influenza - Flu Vaccine QUAD 57mo+IM (Fluarix, Fluzone & Alfiuria Quad PF)  4. Encounter for other contraceptive management Would like back up contraception, rx sent  - Lactic Ac-Citric Ac-Pot Bitart (PHEXXI) 1.8-1-0.4 % GEL; Place 5 g vaginally as directed. Up to 1 hour before intercourse  Dispense: 120 g; Refill: 11    Will contact patient with results of testing completed today. Avoid intercourse until symptoms are resolved. Safe sex encouraged. Avoid the use of soaps or perfumed products in the peri area. Avoid tub baths and sitting in sweaty or wet clothing for prolonged periods of time.

## 2022-06-13 LAB — HIV ANTIBODY (ROUTINE TESTING W REFLEX): HIV 1&2 Ab, 4th Generation: NONREACTIVE

## 2022-06-13 LAB — RPR: RPR Ser Ql: NONREACTIVE

## 2022-06-13 LAB — HEPATITIS B SURFACE ANTIGEN: Hepatitis B Surface Ag: NONREACTIVE

## 2022-06-13 LAB — HEPATITIS C ANTIBODY: Hepatitis C Ab: NONREACTIVE

## 2022-06-14 ENCOUNTER — Other Ambulatory Visit: Payer: Self-pay | Admitting: *Deleted

## 2022-06-14 LAB — SURESWAB® ADVANCED VAGINITIS PLUS,TMA
C. trachomatis RNA, TMA: DETECTED — AB
CANDIDA SPECIES: NOT DETECTED
Candida glabrata: NOT DETECTED
N. gonorrhoeae RNA, TMA: NOT DETECTED
SURESWAB(R) ADV BACTERIAL VAGINOSIS(BV),TMA: POSITIVE — AB
TRICHOMONAS VAGINALIS (TV),TMA: NOT DETECTED

## 2022-06-14 MED ORDER — METRONIDAZOLE 500 MG PO TABS
500.0000 mg | ORAL_TABLET | Freq: Two times a day (BID) | ORAL | 0 refills | Status: AC
Start: 2022-06-14 — End: 2022-06-21

## 2022-06-14 MED ORDER — DOXYCYCLINE HYCLATE 100 MG PO TABS: 100.0000 mg | ORAL_TABLET | Freq: Two times a day (BID) | ORAL | 0 refills | Status: AC

## 2022-08-01 ENCOUNTER — Ambulatory Visit: Payer: BC Managed Care – PPO | Admitting: Radiology

## 2022-08-01 VITALS — BP 114/66

## 2022-08-01 DIAGNOSIS — Z202 Contact with and (suspected) exposure to infections with a predominantly sexual mode of transmission: Secondary | ICD-10-CM

## 2022-08-01 DIAGNOSIS — Z113 Encounter for screening for infections with a predominantly sexual mode of transmission: Secondary | ICD-10-CM | POA: Diagnosis not present

## 2022-08-01 DIAGNOSIS — Z7251 High risk heterosexual behavior: Secondary | ICD-10-CM

## 2022-08-01 NOTE — Progress Notes (Signed)
      Subjective: Elizabeth Stanton is a 32 y.o. female here for TOC chlamydia. Partners were notified, no longer in contact with them.     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   -General: no acute distress -Vulva: without lesions or discharge -Vagina: discharge present, aptima swab obtained -Cervix: no lesion or discharge, no CMT -Perineum: no lesions -Uterus: Mobile, non tender -Adnexa: no masses or tenderness    Chaperone offered and declined.  Assessment:/Plan:   1. Chlamydia contact, treated TOC obtianed - SURESWAB CT/NG/T. vaginalis   Safe sex encouraged

## 2022-08-02 LAB — SURESWAB CT/NG/T. VAGINALIS
C. trachomatis RNA, TMA: NOT DETECTED
N. gonorrhoeae RNA, TMA: NOT DETECTED
Trichomonas vaginalis RNA: NOT DETECTED

## 2022-12-11 ENCOUNTER — Ambulatory Visit: Payer: BC Managed Care – PPO | Admitting: Obstetrics and Gynecology

## 2022-12-11 ENCOUNTER — Encounter: Payer: Self-pay | Admitting: Emergency Medicine

## 2022-12-11 ENCOUNTER — Ambulatory Visit (INDEPENDENT_AMBULATORY_CARE_PROVIDER_SITE_OTHER): Payer: BC Managed Care – PPO | Admitting: Emergency Medicine

## 2022-12-11 VITALS — BP 128/88 | HR 95 | Temp 98.5°F | Ht 65.0 in | Wt 232.0 lb

## 2022-12-11 DIAGNOSIS — Z1329 Encounter for screening for other suspected endocrine disorder: Secondary | ICD-10-CM

## 2022-12-11 DIAGNOSIS — Z13220 Encounter for screening for lipoid disorders: Secondary | ICD-10-CM | POA: Diagnosis not present

## 2022-12-11 DIAGNOSIS — Z130 Encounter for screening for diseases of the blood and blood-forming organs and certain disorders involving the immune mechanism: Secondary | ICD-10-CM

## 2022-12-11 DIAGNOSIS — Z0000 Encounter for general adult medical examination without abnormal findings: Secondary | ICD-10-CM

## 2022-12-11 DIAGNOSIS — Z13228 Encounter for screening for other metabolic disorders: Secondary | ICD-10-CM | POA: Diagnosis not present

## 2022-12-11 DIAGNOSIS — L819 Disorder of pigmentation, unspecified: Secondary | ICD-10-CM

## 2022-12-11 DIAGNOSIS — R29818 Other symptoms and signs involving the nervous system: Secondary | ICD-10-CM | POA: Diagnosis not present

## 2022-12-11 LAB — COMPREHENSIVE METABOLIC PANEL
ALT: 15 U/L (ref 0–35)
AST: 19 U/L (ref 0–37)
Albumin: 4.1 g/dL (ref 3.5–5.2)
Alkaline Phosphatase: 56 U/L (ref 39–117)
BUN: 9 mg/dL (ref 6–23)
CO2: 29 mEq/L (ref 19–32)
Calcium: 9.5 mg/dL (ref 8.4–10.5)
Chloride: 102 mEq/L (ref 96–112)
Creatinine, Ser: 0.69 mg/dL (ref 0.40–1.20)
GFR: 114.87 mL/min (ref >60.00)
Glucose, Bld: 87 mg/dL (ref 70–99)
Potassium: 4.6 mEq/L (ref 3.5–5.1)
Sodium: 137 mEq/L (ref 135–145)
Total Bilirubin: 0.4 mg/dL (ref 0.2–1.2)
Total Protein: 8.4 g/dL — ABNORMAL HIGH (ref 6.0–8.3)

## 2022-12-11 LAB — LIPID PANEL
Cholesterol: 144 mg/dL (ref 0–200)
HDL: 47.1 mg/dL (ref >39.00)
LDL Cholesterol: 82 mg/dL (ref 0–99)
NonHDL: 97.11
Total CHOL/HDL Ratio: 3
Triglycerides: 74 mg/dL (ref 0.0–149.0)
VLDL: 14.8 mg/dL (ref 0.0–40.0)

## 2022-12-11 LAB — CBC WITH DIFFERENTIAL/PLATELET
Basophils Absolute: 0 10*3/uL (ref 0.0–0.1)
Basophils Relative: 0.6 % (ref 0.0–3.0)
Eosinophils Absolute: 0.2 10*3/uL (ref 0.0–0.7)
Eosinophils Relative: 2.3 % (ref 0.0–5.0)
HCT: 41.6 % (ref 36.0–46.0)
Hemoglobin: 14 g/dL (ref 12.0–15.0)
Lymphocytes Relative: 27.5 % (ref 12.0–46.0)
Lymphs Abs: 2 10*3/uL (ref 0.7–4.0)
MCHC: 33.5 g/dL (ref 30.0–36.0)
MCV: 91.2 fl (ref 78.0–100.0)
Monocytes Absolute: 0.6 10*3/uL (ref 0.1–1.0)
Monocytes Relative: 7.8 % (ref 3.0–12.0)
Neutro Abs: 4.5 10*3/uL (ref 1.4–7.7)
Neutrophils Relative %: 61.8 % (ref 43.0–77.0)
Platelets: 502 10*3/uL — ABNORMAL HIGH (ref 150.0–400.0)
RBC: 4.56 Mil/uL (ref 3.87–5.11)
RDW: 12.9 % (ref 11.5–15.5)
WBC: 7.3 10*3/uL (ref 4.0–10.5)

## 2022-12-11 LAB — HEMOGLOBIN A1C: Hgb A1c MFr Bld: 5.5 % (ref 4.6–6.5)

## 2022-12-11 NOTE — Progress Notes (Signed)
Elizabeth Stanton 32 y.o.   Chief Complaint  Patient presents with   Annual Exam    Patient states she may have sleep apnea, she is not sure.     skin issues    Patient has a mole on her left arm that is getting hard, been there for years     HISTORY OF PRESENT ILLNESS: This is a 32 y.o. female here for annual exam Concerned about sleep apnea.  Snores. Also concerned about multiple left arm that is getting harder. History of pulmonary embolism in 2020.  No longer on anticoagulation. Overall doing well.  No other complaints or medical concerns today.  HPI   Prior to Admission medications   Medication Sig Start Date End Date Taking? Authorizing Provider  citalopram (CELEXA) 10 MG tablet Take 1 tablet (10 mg total) by mouth daily. Add to the 20 mg tablet for a total dose of 30 mg daily 12/08/21  Yes Romualdo Bolk, MD  citalopram (CELEXA) 20 MG tablet Take one tablet a day. Add to the 10 mg tablet for a total dose of 30 mg a day. 12/08/21  Yes Romualdo Bolk, MD  levonorgestrel [facility]) 20 MCG/24HR IUD 1 each by Intrauterine route once.   Yes [provider]  Lactic Ac-Citric Ac-Pot Bitart (PHEXXI) 1.8-1-0.4 % GEL Place 5 g vaginally as directed. 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)    Cerebral palsy (HCC)    Right hemiplegia (HCC) 10/17/2017   History of cerebral palsy     Past Medical History:  Diagnosis Date   Allergy Lavender   Anxiety 11/02/2020   Cerebral palsy (HCC)    Depression 11/02/2020   Dysmenorrhea    History of  abnormal cervical Pap smear    + HPV   History of cerebral palsy    History of pulmonary embolism    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)     Past Surgical History:  Procedure Laterality Date   COLPOSCOPY      Social History   Socioeconomic History   Marital status: Married    Spouse name: Not on file   Number of children: Not on file   Years of education: Not on file   Highest education level: Not on file  Occupational History   Not on file  Tobacco Use   Smoking status: Never   Smokeless tobacco: Never  Vaping Use   Vaping Use: Never used  Substance and Sexual Activity   Alcohol use: Yes    Alcohol/week: 1.0 standard drink of alcohol    Types: 1 Standard drinks or equivalent per week    Comment: wine   Drug use: No   Sexual activity: Yes    Partners: Male    Birth control/protection: I.U.D.    Comment: Was taken off of my birth control due to a blood clot  Other Topics Concern   Not on file  Social History Narrative   Not on file   Social Determinants of Health   Financial Resource Strain: Not on file  Food Insecurity: Not on file  Transportation Needs: Not on file  Physical Activity: Not on file  Stress: Not on file  Social Connections: Not on file  Intimate Partner Violence: Not on file    Family History  Problem Relation Age of Onset   Hypertension Mother    Diabetes Mother    Hypertension Maternal Grandmother    Cancer Paternal Grandfather        skin     Review of Systems  Constitutional: Negative.  Negative for chills and fever.  HENT: Negative.  Negative for congestion and sore throat.   Respiratory: Negative.  Negative for cough and shortness of breath.   Cardiovascular: Negative.  Negative for chest pain and palpitations.  Gastrointestinal:  Negative for abdominal pain, diarrhea, nausea and vomiting.  Genitourinary: Negative.  Negative for dysuria and hematuria.  Skin: Negative.  Negative for rash.  Neurological:  Negative.  Negative for dizziness and headaches.  All other systems reviewed and are negative.   Vitals:   12/11/22 0853  BP: 128/88  Pulse: 95  Temp: 98.5 F (36.9 C)  SpO2: 95%    Physical Exam Vitals reviewed.  Constitutional:      Appearance: Normal appearance.  HENT:     Head: Normocephalic.     Right Ear: Tympanic membrane, ear canal and external ear normal.     Left Ear: Tympanic membrane, ear canal and external ear normal.     Mouth/Throat:     Mouth: Mucous membranes are moist.     Pharynx: Oropharynx is clear.  Eyes:     Extraocular Movements: Extraocular movements intact.     Pupils: Pupils are equal, round, and reactive to light.  Cardiovascular:     Rate and Rhythm: Normal rate and regular rhythm.     Pulses: Normal pulses.     Heart sounds: Normal heart sounds.  Pulmonary:     Effort: Pulmonary effort is normal.     Breath sounds: Normal breath sounds.  Abdominal:     General: There is no distension.     Palpations: Abdomen is soft.     Tenderness: There is no abdominal tenderness.  Musculoskeletal:     Cervical back: No tenderness.     Comments: Right arm atrophy secondary to CP  Lymphadenopathy:     Cervical: No cervical adenopathy.  Skin:    General: Skin is warm and dry.     Capillary Refill: Capillary refill takes less than 2 seconds.     Findings: Lesion present.     Comments: Small hyperpigmented skin lesion to left upper arm  Neurological:     General: No focal deficit present.     Mental Status: She is alert and oriented to person, place, and time.  Psychiatric:        Mood and Affect: Mood normal.        Behavior: Behavior normal.      ASSESSMENT & PLAN: Problem List Items Addressed This Visit   None Visit Diagnoses     Routine general medical examination at a health care facility    -  Primary   Relevant Orders   CBC with Differential/Platelet   Comprehensive metabolic panel   Lipid panel   Hemoglobin A1c   Hyperpigmented  skin lesion       Relevant Orders   Ambulatory referral to Dermatology   Suspected sleep apnea       Relevant Orders   Ambulatory referral to Sleep Studies   Screening for deficiency  anemia       Relevant Orders   CBC with Differential/Platelet   Screening for lipoid disorders       Relevant Orders   Lipid panel   Screening for endocrine, metabolic and immunity disorder       Relevant Orders   Comprehensive metabolic panel   Hemoglobin A1c      Modifiable risk factors discussed with patient. Anticipatory guidance according to age provided. The following topics were also discussed: Social Determinants of Health Smoking.  Non-smoker Diet and nutrition and need to decrease amount of daily carbohydrate intake and daily calories and increase amount of plant-based protein in her diet Benefits of exercise Cancer family history review Vaccinations reviewed and recommendations Cardiovascular risk assessment and need for blood work Mental health including depression and anxiety Fall and accident prevention  Patient Instructions  Health Maintenance, Female Adopting a healthy lifestyle and getting preventive care are important in promoting health and wellness. Ask your health care provider about: The right schedule for you to have regular tests and exams. Things you can do on your own to prevent diseases and keep yourself healthy. What should I know about diet, weight, and exercise? Eat a healthy diet  Eat a diet that includes plenty of vegetables, fruits, low-fat dairy products, and lean protein. Do not eat a lot of foods that are high in solid fats, added sugars, or sodium. Maintain a healthy weight Body mass index (BMI) is used to identify weight problems. It estimates body fat based on height and weight. Your health care provider can help determine your BMI and help you achieve or maintain a healthy weight. Get regular exercise Get regular exercise. This is one of the most  important things you can do for your health. Most adults should: Exercise for at least 150 minutes each week. The exercise should increase your heart rate and make you sweat (moderate-intensity exercise). Do strengthening exercises at least twice a week. This is in addition to the moderate-intensity exercise. Spend less time sitting. Even light physical activity can be beneficial. Watch cholesterol and blood lipids Have your blood tested for lipids and cholesterol at 32 years of age, then have this test every 5 years. Have your cholesterol levels checked more often if: Your lipid or cholesterol levels are high. You are older than 32 years of age. You are at high risk for heart disease. What should I know about cancer screening? Depending on your health history and family history, you may need to have cancer screening at various ages. This may include screening for: Breast cancer. Cervical cancer. Colorectal cancer. Skin cancer. Lung cancer. What should I know about heart disease, diabetes, and high blood pressure? Blood pressure and heart disease High blood pressure causes heart disease and increases the risk of stroke. This is more likely to develop in people who have high blood pressure readings or are overweight. Have your blood pressure checked: Every 3-5 years if you are 62-79 years of age. Every year if you are 67 years old or older. Diabetes Have regular diabetes screenings. This checks your fasting blood sugar level. Have the screening done: Once every three years after age 45 if you are at a normal weight and have a low risk for diabetes. More often and at a younger age if you are overweight or have a high risk for diabetes. What should I know about preventing infection? Hepatitis B If you have a higher risk for hepatitis B, you should be screened for this virus.  Talk with your health care provider to find out if you are at risk for hepatitis B infection. Hepatitis C Testing  is recommended for: Everyone born from [birth year] through 1965. Anyone with known risk factors for hepatitis C. Sexually transmitted infections (STIs) Get screened for STIs, including gonorrhea and chlamydia, if: You are sexually active and are younger than 32 years of age. You are older than 32 years of age and your health care provider tells you that you are at risk for this type of infection. Your sexual activity has changed since you were last screened, and you are at increased risk for chlamydia or gonorrhea. Ask your health care provider if you are at risk. Ask your health care provider about whether you are at high risk for HIV. Your health care provider may recommend a prescription medicine to help prevent HIV infection. If you choose to take medicine to prevent HIV, you should first get tested for HIV. You should then be tested every 3 months for as long as you are taking the medicine. Pregnancy If you are about to stop having your period (premenopausal) and you may become pregnant, seek counseling before you get pregnant. Take 400 to 800 micrograms (mcg) of folic acid every day if you become pregnant. Ask for birth control (contraception) if you want to prevent pregnancy. Osteoporosis and menopause Osteoporosis is a disease in which the bones lose minerals and strength with aging. This can result in bone fractures. If you are 32 years old or older, or if you are at risk for osteoporosis and fractures, ask your health care provider if you should: Be screened for bone loss. Take a calcium or vitamin D supplement to lower your risk of fractures. Be given hormone replacement therapy (HRT) to treat symptoms of menopause. Follow these instructions at home: Alcohol use Do not drink alcohol if: Your health care provider tells you not to drink. You are pregnant, may be pregnant, or are planning to become pregnant. If you drink alcohol: Limit how much you have to: 0-1 drink a day. Know how much  alcohol is in your drink. In the [place] one drink equals one 12 oz bottle of beer (355 mL), one 5 oz glass of wine (148 mL), or one 1 oz glass of hard liquor (44 mL). Lifestyle Do not use any products that contain nicotine or tobacco. These products include cigarettes, chewing tobacco, and vaping devices, such as e-cigarettes. If you need help quitting, ask your health care provider. Do not use street drugs. Do not share needles. Ask your health care provider for help if you need support or information about quitting drugs. General instructions Schedule regular health, dental, and eye exams. Stay current with your vaccines. Tell your health care provider if: You often feel depressed. You have ever been abused or do not feel safe at home. Summary Adopting a healthy lifestyle and getting preventive care are important in promoting health and wellness. Follow your health care provider's instructions about healthy diet, exercising, and getting tested or screened for diseases. Follow your health care provider's instructions on monitoring your cholesterol and blood pressure. This information is not intended to replace advice given to you by your health care provider. Make sure you discuss any questions you have with your health care provider. Document Revised: 11/29/2020 Document Reviewed: 11/29/2020 Elsevier Patient Education  2023 [employer]     Edwina Barth, MD [facility]

## 2022-12-11 NOTE — Patient Instructions (Signed)

## 2023-01-08 NOTE — Progress Notes (Unsigned)
32 y.o. G0P0000 Married Black or [place] American Not Hispanic or Latino female here for annual exam.   She has a mirena IUD, inserted in 5/21.    She is not with her husband, he is living with her because he needs a place to stay. They are very good friends.   She has had a new partner, not together. Would like STD testing.   Patient's last menstrual period was 01/01/2023 (approximate).          Sexually active: Yes.    The current method of family planning is IUD.    Exercising: Yes.     Weights and cardio Smoker:  no  Health Maintenance: Pap 12/08/21 WNL Hr hpv Neg,  11/01/20 ASCUS/+HPV; 09/21/2017 WNL: 2017 WNL per patient  History of abnormal Pap:  yes, see above, negative biopsies.  MMG:  none  BMD:   none  Colonoscopy: none  TDaP:  08/31/17  Gardasil: Complete    reports that she has never smoked. She has never used smokeless tobacco. She reports current alcohol use of about 1.0 standard drink of alcohol per week. She reports that she does not use drugs. 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 She is getting her PhD in curriculum and instruction.   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 Add to the 20 mg tablet for a total dose of 30 mg daily 90 tablet 3   citalopram (CELEXA) 20 MG tablet Take one tablet a day. Add to the 10 mg tablet for a total dose of 30 mg a day. 90 tablet 3   levonorgestrel (MIRENA) 20 MCG/24HR IUD 1 each by Intrauterine route  once.     Lactic Ac-Citric Ac-Pot Bitart (PHEXXI) 1.8-1-0.4 % GEL Place 5 g vaginally as directed. Up to 1 hour before intercourse (Patient not taking: Reported on 01/09/2023) 120 g 11   No current facility-administered medications for this visit.    Family History  Problem Relation Age of Onset   Hypertension Mother    Diabetes Mother    Hypertension Maternal Grandmother    Cancer Paternal Grandfather        skin    Review of Systems  All other systems reviewed and are negative.   Exam:   BP 124/82 (BP Location: Left Arm, Patient Position: Sitting)   Pulse (!) 102   Resp 20   Ht 5' 4.57" (1.64 m)   Wt 227 lb 3.2 oz (103.1 kg)   LMP 01/01/2023 (Approximate) Comment: Mirena IUD inerted 11/2019  SpO2 98%   BMI 38.32 kg/m   Weight change: @WEIGHTCHANGE @ Height:   Height: 5' 4.57" (164 cm)  Ht Readings from Last 3 Encounters:  01/10/23 5' 4.57" (1.64 m)  12/11/22 5\' 5"  (1.651 m)  12/08/21 5\' 5"  (1.651 m)    General appearance: alert, cooperative and appears stated age Head: Normocephalic, without obvious abnormality,  atraumatic Neck: no adenopathy, supple, symmetrical, trachea midline and thyroid normal to inspection and palpation Lungs: clear to auscultation bilaterally Cardiovascular: regular rate and rhythm Breasts: normal appearance, no masses or tenderness Abdomen: soft, non-tender; non distended,  no masses,  no organomegaly Extremities: extremities normal, atraumatic, no cyanosis or edema Skin: Skin color, texture, turgor normal. No rashes or lesions Lymph nodes: Cervical, supraclavicular, and axillary nodes normal. No abnormal inguinal nodes palpated Neurologic: Grossly normal   Pelvic: External genitalia:  no lesions              Urethra:  normal appearing urethra with no masses, tenderness or lesions              Bartholins and Skenes: normal                 Vagina: normal appearing vagina with normal color and discharge, no lesions              Cervix: no  lesions and IUD strings 4 cm, trimmed to 3 cm               Bimanual Exam:  Uterus:  normal size, contour, position, consistency, mobility, non-tender              Adnexa: no mass, fullness, tenderness               Rectovaginal: Confirms               Anus:  normal sphincter tone, no lesions  Carmelina Dane, RN chaperoned for the exam.  1. Well woman exam Discussed breast self exam Discussed calcium and vit D intake  2. Screening for cervical cancer - Cytology - PAP  3. Screening examination for STD (sexually transmitted disease) - RPR - HIV Antibody (routine testing w rflx) - Hepatitis C antibody - SURESWAB CT/NG/T. vaginalis

## 2023-01-09 ENCOUNTER — Encounter: Payer: Self-pay | Admitting: Dermatology

## 2023-01-09 ENCOUNTER — Ambulatory Visit: Payer: BC Managed Care – PPO | Admitting: Dermatology

## 2023-01-09 VITALS — BP 142/93

## 2023-01-09 DIAGNOSIS — D2362 Other benign neoplasm of skin of left upper limb, including shoulder: Secondary | ICD-10-CM | POA: Diagnosis not present

## 2023-01-09 DIAGNOSIS — D239 Other benign neoplasm of skin, unspecified: Secondary | ICD-10-CM

## 2023-01-09 NOTE — Patient Instructions (Signed)
Intralesional steroid injection side effects were reviewed including thinning of the skin and discoloration, such as redness, lightening or darkening.  Due to recent changes in healthcare laws, you may see results of your pathology and/or laboratory studies on MyChart before the doctors have had a chance to review them. We understand that in some cases there may be results that are confusing or concerning to you. Please understand that not all results are received at the same time and often the doctors may need to interpret multiple results in order to provide you with the best plan of care or course of treatment. Therefore, we ask that you please give [place] 2 business days to thoroughly review all your results before contacting the office for clarification. Should we see a critical lab result, you will be contacted sooner.   If You Need Anything After Your Visit  If you have any questions or concerns for your doctor, please call our main line at [phone] If no one answers, please leave a voicemail as directed and we will return your call as soon as possible. Messages left after 4 pm will be answered the following business day.   You may also send [place] a message via MyChart. We typically respond to MyChart messages within 1-2 business days.  For prescription refills, please ask your pharmacy to contact our office. Our fax number is [phone].  If you have an urgent issue when the clinic is closed that cannot wait until the next business day, you can page your doctor at the number below.    Please note that while we do our best to be available for urgent issues outside of office hours, we are not available 24/7.   If you have an urgent issue and are unable to reach [place], you may choose to seek medical care at your doctor's office, retail clinic, urgent care center, or emergency room.  If you have a medical emergency, please immediately call 911 or go to the emergency department. In the event of  inclement weather, please call our main line at [phone] for an update on the status of any delays or closures.  Dermatology Medication Tips: Please keep the boxes that topical medications come in in order to help keep track of the instructions about where and how to use these. Pharmacies typically print the medication instructions only on the boxes and not directly on the medication tubes.   If your medication is too expensive, please contact our office at [phone] or send [place] a message through MyChart.   We are unable to tell what your co-pay for medications will be in advance as this is different depending on your insurance coverage. However, we may be able to find a substitute medication at lower cost or fill out paperwork to get insurance to cover a needed medication.   If a prior authorization is required to get your medication covered by your insurance company, please allow [place] 1-2 business days to complete this process.  Drug prices often vary depending on where the prescription is filled and some pharmacies may offer cheaper prices.  The website www.goodrx.com contains coupons for medications through different pharmacies. The prices here do not account for what the cost may be with help from insurance (it may be cheaper with your insurance), but the website can give you the price if you did not use any insurance.  - You can print the associated coupon and take it with your prescription to the pharmacy.  - You may also  stop by our office during regular business hours and pick up a GoodRx coupon card.  - If you need your prescription sent electronically to a different pharmacy, notify our office through [facility] MyChart or by phone at [phone]     

## 2023-01-09 NOTE — Progress Notes (Signed)
g  New Patient Visit   Subjective  Elizabeth Stanton is a 32 y.o. female who presents for the following: a spot at left upper arm. Present for years, has gotten larger and harder which she noticed about 1 month ago. Also has become sensitive to the touch.   Patient's grandfather passed away last year from melanoma.    The following portions of the chart were reviewed this encounter and updated as appropriate: medications, allergies, medical history  Review of Systems:  No other skin or systemic complaints except as noted in HPI or Assessment and Plan.  Objective  Well appearing patient in no apparent distress; mood and affect are within normal limits.   A focused examination was performed of the following areas: Arms, legs  Relevant exam findings are noted in the Assessment and Plan.    Assessment & Plan   DERMATOFIBROMA Exam: Firm pink/brown papulenodule with dimple sign.  Treatment Plan: A dermatofibroma is a benign growth possibly related to trauma, such as an insect bite, cut from shaving, or inflamed acne-type bump.    Treatment options to remove include shave or excision with resulting scar and risk of recurrence.  Since benign-appearing and not bothersome, will observe for now.   Pt c/o itching with is normal with active DN's, offered ILK vs topical clobetasol to help with the itch.  Pt preferred ILK    Procedure Note Intralesional Injection  Location: left upper arm  Informed Consent: Discussed risks (infection, pain, bleeding, bruising, thinning of the skin, loss of skin pigment, lack of resolution, and recurrence of lesion) and benefits of the procedure, as well as the alternatives. Informed consent was obtained. Preparation: The area was prepared a standard fashion.  Anesthesia: none  Procedure Details: An intralesional injection was performed with  Kenalog 10 mg/cc . .05 cc in total were injected. NDC #: 4259-5638-75 Exp: 06/2024  Total number of injections:  1  Plan: The patient was instructed on post-op care. Recommend OTC analgesia as needed for pain.  Intralesional steroid injection side effects were reviewed including thinning of the skin and discoloration, such as redness, lightening or darkening.   Return if symptoms worsen or fail to improve.  Anise Salvo, RMA, am acting as scribe for [employer], DO .   Documentation: I have reviewed the above documentation for accuracy and completeness, and I agree with the above.  Langston Reusing, DO  +

## 2023-01-10 ENCOUNTER — Ambulatory Visit (INDEPENDENT_AMBULATORY_CARE_PROVIDER_SITE_OTHER): Payer: BC Managed Care – PPO | Admitting: Obstetrics and Gynecology

## 2023-01-10 ENCOUNTER — Other Ambulatory Visit (HOSPITAL_COMMUNITY)
Admission: RE | Admit: 2023-01-10 | Discharge: 2023-01-10 | Disposition: A | Discharge status: Home / Self Care | Payer: BC Managed Care – PPO | Source: Ambulatory Visit | Attending: Obstetrics and Gynecology | Admitting: Obstetrics and Gynecology

## 2023-01-10 ENCOUNTER — Encounter: Payer: Self-pay | Admitting: Obstetrics and Gynecology

## 2023-01-10 VITALS — BP 124/82 | HR 102 | Resp 20 | Ht 64.57 in | Wt 227.2 lb

## 2023-01-10 DIAGNOSIS — Z01419 Encounter for gynecological examination (general) (routine) without abnormal findings: Secondary | ICD-10-CM

## 2023-01-10 DIAGNOSIS — Z124 Encounter for screening for malignant neoplasm of cervix: Secondary | ICD-10-CM | POA: Insufficient documentation

## 2023-01-10 DIAGNOSIS — Z113 Encounter for screening for infections with a predominantly sexual mode of transmission: Secondary | ICD-10-CM

## 2023-01-10 DIAGNOSIS — F418 Other specified anxiety disorders: Secondary | ICD-10-CM

## 2023-01-10 MED ORDER — CITALOPRAM HYDROBROMIDE 20 MG PO TABS: ORAL_TABLET | ORAL | 3 refills | Status: DC

## 2023-01-10 MED ORDER — CITALOPRAM HYDROBROMIDE 10 MG PO TABS: 10.0000 mg | ORAL_TABLET | Freq: Every day | ORAL | 3 refills | Status: DC

## 2023-01-10 NOTE — Patient Instructions (Signed)

## 2023-01-11 LAB — SURESWAB CT/NG/T. VAGINALIS
C. trachomatis RNA, TMA: NOT DETECTED
N. gonorrhoeae RNA, TMA: NOT DETECTED
Trichomonas vaginalis RNA: NOT DETECTED

## 2023-01-11 LAB — HIV ANTIBODY (ROUTINE TESTING W REFLEX): HIV 1&2 Ab, 4th Generation: NONREACTIVE

## 2023-01-11 LAB — HEPATITIS C ANTIBODY: Hepatitis C Ab: NONREACTIVE

## 2023-01-11 LAB — RPR: RPR Ser Ql: NONREACTIVE

## 2023-01-15 LAB — CYTOLOGY - PAP
Adequacy: ABSENT
Comment: NEGATIVE
Diagnosis: NEGATIVE
High risk HPV: NEGATIVE

## 2023-01-16 ENCOUNTER — Ambulatory Visit: Payer: BC Managed Care – PPO | Admitting: Neurology

## 2023-01-16 ENCOUNTER — Encounter: Payer: Self-pay | Admitting: Neurology

## 2023-01-16 VITALS — BP 134/86 | HR 98 | Ht 64.0 in | Wt 229.0 lb

## 2023-01-16 DIAGNOSIS — G809 Cerebral palsy, unspecified: Secondary | ICD-10-CM

## 2023-01-16 DIAGNOSIS — R0683 Snoring: Secondary | ICD-10-CM | POA: Insufficient documentation

## 2023-01-16 DIAGNOSIS — Z9189 Other specified personal risk factors, not elsewhere classified: Secondary | ICD-10-CM

## 2023-01-16 DIAGNOSIS — D6859 Other primary thrombophilia: Secondary | ICD-10-CM | POA: Diagnosis not present

## 2023-01-16 DIAGNOSIS — G8191 Hemiplegia, unspecified affecting right dominant side: Secondary | ICD-10-CM

## 2023-01-16 NOTE — Progress Notes (Signed)
SLEEP MEDICINE CLINIC    Provider:  Melvyn Novas, MD  Primary Care Physician:  Georgina Quint, MD [address] [place] [address]     Referring Provider: Georgina Quint, [place] [address] [place],  [place] [address]          Chief Complaint according to patient   Patient presents with:     New Patient (Initial Visit)           HISTORY OF PRESENT ILLNESS:  Elizabeth Stanton is a 32 y.o. female patient who is seen upon referral on 01/16/2023 from Dr Alvy Bimler  for a Sleep medicine Consultation .    I have the pleasure of seeing Elizabeth Stanton on 01/16/23 a left-handed AA female with a possible sleep disorder.  Mrs. Bakula is a married 32 year old female who was born into a military family and she reports that when she was falling she neglected to use her right side.  Her mother made the pediatricians aware of this and she was diagnosed with palsy.  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  She has a history of pulmonary embolism which took place in 2020.  At this time she does no longer require anticoagulation.  She snores.  She went on a vacation with her family and while her husband was not concerned about her sleep breathing her mother was.  She was witnessing irregular breathing and believes that the patient has sleep apnea.  She was also sensitized because she has a husband with sleep apnea. The patient has been  heavier over the last 3 years , over 200 pounds, gained about 20 in that time frame.       Sleep relevant  ROS medical history: Nocturia/ one time- , no asthma, no ENT/ Tonsillectomy, sinusitis in winter. Right ear infection, in winter -  Family medical /sleep history: father on CPAP with OSA.    Social history: The patient went to college in [place], and lives here since 2018.  She is working on her [employer] boy. Patient is working as Engineering geologist at [employer]- and lives in a household with spouse, no  children. Pets are present - one dog. Tobacco use: none. ETOH use: rare . Caffeine intake in form of Coffee( 1 cup ) Soda( /) Tea ( /) , cut out sod this spring.  Exercise in form of gym exercises at 5 Pm daily..       Sleep habits are as follows: The patient's dinner time is between 6-7 PM. The patient goes to bed at 11 PM and continues to sleep for 3 hours, wakes for 1 bathroom break, often none- and goes back to sleep.  Bedroom is cool, quiet and dark.   The preferred sleep position is lateral, wakes up on her stomach- with the support of 2 pillows. Dreams are reportedly rare- but if she dreams its vivid.   The patient wakes up at 6.30  with an alarm. 7.00  AM is the usual rise time. She reports not feeling refreshed or restored in AM, with symptoms such as dry mouth, morning headaches, and residual fatigue.  Naps are taken on Weekends, for many hours frequently lasting from 1-3 hour and are less refreshing.    Review of Systems: Out of a complete 14 system review, the patient complains of only the following symptoms, and all other reviewed systems are negative.:  Fatigue, sleepiness , snoring,   How likely are you to  doze in the following situations: 0 = not likely, 1 = slight chance, 2 = moderate chance, 3 = high chance   Sitting and Reading? Watching Television? Sitting inactive in a public place (theater or meeting)? As a passenger in a car for an hour without a break? Lying down in the afternoon when circumstances permit? Sitting and talking to someone? Sitting quietly after lunch without alcohol? In a car, while stopped for a few minutes in traffic?   Total = 5/ 24 points   FSS endorsed at 14/ 63 points.   On antidepressant , citalopram.   Social History   Socioeconomic History   Marital status: Married    Spouse name: Not on file   Number of children: None   Years of education: Masters degree , working on [employer] education level: 20  Occupational History    Not on file  Tobacco Use   Smoking status: Never   Smokeless tobacco: Never  Vaping Use   Vaping Use: Never used  Substance and Sexual Activity   Alcohol use: Yes    Alcohol/week: 1.0 standard drink of alcohol    Types: 1 Standard drinks or equivalent per week    Comment: wine   Drug use: No   Sexual activity: Yes    Partners: Male    Birth control/protection: I.U.D.    Comment: Was taken off of my birth control due to a blood clot, PE   Other Topics Concern   Not on file  Social History Narrative   Not on file   Social Determinants of Health   Financial Resource Strain: Not on file  Food Insecurity: Not on file  Transportation Needs: Not on file  Physical Activity: Not on file  Stress: Not on file  Social Connections: Not on file    Family History  Problem Relation Age of Onset   Hypertension Mother    Diabetes Mother    Sleep apnea Father    Hypertension Maternal Grandmother    Cancer Paternal Grandfather        skin    Past Medical History:  Diagnosis Date   Allergy Lavender   Anxiety 11/02/2020   Cerebral palsy (HCC)    Depression 11/02/2020   Dysmenorrhea    History of abnormal cervical Pap smear    + HPV   History of cerebral palsy    History of pulmonary embolism    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 Add to the 20 mg tablet for a total dose of 30 mg daily 90 tablet 3   citalopram (CELEXA) 20 MG tablet Take one tablet a day. Add to the 10 mg tablet for a total dose of 30 mg a day. 90 tablet 3   levonorgestrel (MIRENA) 20 MCG/24HR IUD 1 each by Intrauterine route once.     No current facility-administered medications on file prior to visit.    Allergies  Allergen Reactions   Lavender Oil Hives      DIAGNOSTIC DATA (LABS, IMAGING, TESTING) - I reviewed patient records, labs, notes, testing and imaging myself where available.  Lab Results  Component Value Date   WBC 7.3 12/11/2022   HGB 14.0 12/11/2022  HCT 41.6 12/11/2022   MCV 91.2 12/11/2022   PLT 502.0 (H) 12/11/2022      Component Value Date/Time   NA 137 12/11/2022 0947   NA 139 09/24/2019 1112   K 4.6 12/11/2022 0947   CL 102 12/11/2022 0947   CO2 29 12/11/2022 0947   GLUCOSE 87 12/11/2022 0947   BUN 9 12/11/2022 0947   BUN 9 09/24/2019 1112   CREATININE 0.69 12/11/2022 0947   CREATININE 0.73 11/01/2020 1418   CALCIUM 9.5 12/11/2022 0947   PROT 8.4 (H) 12/11/2022 0947   PROT 8.0 09/24/2019 1112   ALBUMIN 4.1 12/11/2022 0947   ALBUMIN 4.0 09/24/2019 1112   AST 19 12/11/2022 0947   ALT 15 12/11/2022 0947   ALKPHOS 56 12/11/2022 0947   BILITOT 0.4 12/11/2022 0947   BILITOT <0.2 09/24/2019 1112   GFRNONAA 112 09/24/2019 1112   GFRAA 129 09/24/2019 1112   Lab Results  Component Value Date   CHOL 144 12/11/2022   HDL 47.10 12/11/2022   LDLCALC 82 12/11/2022   TRIG 74.0 12/11/2022   CHOLHDL 3 12/11/2022   Lab Results  Component Value Date   HGBA1C 5.5 12/11/2022   No results found for: "VITAMINB12" Lab Results  Component Value Date   TSH 1.85 11/01/2020    PHYSICAL EXAM:  Today's Vitals   01/16/23 1006  BP: 134/86  Pulse: 98  Weight: 229 lb (103.9 kg)  Height: 5\' 4"  (1.626 m)   Body mass index is 39.31 kg/m.   Wt Readings from Last 3 Encounters:  01/16/23 229 lb (103.9 kg)  01/10/23 227 lb 3.2 oz (103.1 kg)  12/11/22 232 lb (105.2 kg)     Ht Readings from Last 3 Encounters:  01/16/23 5\' 4"  (1.626 m)  01/10/23 5' 4.57" (1.64 m)  12/11/22 5\' 5"  (1.651 m)      General: The patient is awake, alert and appears not in acute distress. The patient is well groomed. Head: Normocephalic, atraumatic. Neck is supple.  Mallampati 2-3,  neck circumference:17 inches . Nasal airflow  patent.   Retrognathia is seen.  Dental status: biological  Cardiovascular:  Regular rate and cardiac rhythm by pulse,  without distended neck veins. Respiratory: Lungs are clear to auscultation.  Skin:  With evidence of ankle edema, or rash. Trunk: The patient's posture is erect.   NEUROLOGIC EXAM: The patient is awake and alert, oriented to place and time.   Memory subjective described as intact.  Attention span & concentration ability appears normal.  Speech is fluent,  without  dysarthria, dysphonia or aphasia.  Mood and affect are appropriate.   Cranial nerves: no loss of smell or taste reported  Pupils are equal and briskly reactive to light. Funduscopic exam deferred.  Extraocular movements in vertical and horizontal planes were intact and without nystagmus. No Diplopia. Visual fields by finger perimetry are intact. Hearing was intact to soft voice and finger rubbing.  Right side decreased volume.   Facial sensation intact to fine touch.  Facial motor strength is symmetric and tongue and uvula move midline.  Neck ROM : rotation, tilt and flexion extension were normal for age The shoulder shrug was asymmetrical, the right shoulder is diminutive, the arm smaller , the hands smaller.    Motor exam:  bulk, tone are smaller in the right body half.  asymmetric grip strength . Right side unable to grip.    Sensory:  Fine touch and vibration Proprioception tested in the upper extremities, the sense of vibration was  decreased in the right    Coordination: The Finger-to-nose maneuver was intact without evidence of ataxia, dysmetria or tremor.   Gait and station: Patient could rise unassisted from a seated position, walked without assistive device.  Deep tendon reflexes: in the  upper and lower extremities are brisk on the right .     ASSESSMENT AND PLAN 32 y.o. year old female  here with:    1)  01/30/1991 birth in [place], history of premature birth, 3 months early(?) .  Brother had  similar premature birth,  she has palsy, he has strabism, he has incubator eye disease.   2) snoring , weight gain, witnessed apneas. Risk factor  BMI of 39.3 and asymmetric airway, neck size.   He will screen for OSA-   I plan to follow up either personally or through our NP within 3-5 months.   I would like to thank  Georgina Quint, Md [address] [place],  [place] [address] for allowing me to meet with and to take care of this pleasant patient.   CC: I will share my notes with her Gynecologist, PCP.  After spending a total time of  45  minutes face to face and additional time for physical and neurologic examination, review of laboratory studies,  personal review of imaging studies, reports and results of other testing and review of referral information / records as far as provided in visit,   Electronically signed by: Melvyn Novas, MD 01/16/2023 10:23 AM  [facility] Board certified by The [employer] of Sleep Medicine and Diplomate of the [employer] of Sleep Medicine. Board certified In Neurology through the ABPN, Fellow of the [employer] of Neurology. Medical Director of [facility].

## 2023-02-07 ENCOUNTER — Ambulatory Visit: Payer: BC Managed Care – PPO | Admitting: Neurology

## 2023-02-07 DIAGNOSIS — R0683 Snoring: Secondary | ICD-10-CM

## 2023-02-07 DIAGNOSIS — G4733 Obstructive sleep apnea (adult) (pediatric): Secondary | ICD-10-CM

## 2023-02-07 DIAGNOSIS — G8191 Hemiplegia, unspecified affecting right dominant side: Secondary | ICD-10-CM

## 2023-02-07 DIAGNOSIS — Z9189 Other specified personal risk factors, not elsewhere classified: Secondary | ICD-10-CM

## 2023-02-07 DIAGNOSIS — G809 Cerebral palsy, unspecified: Secondary | ICD-10-CM

## 2023-02-07 DIAGNOSIS — D6859 Other primary thrombophilia: Secondary | ICD-10-CM

## 2023-02-14 DIAGNOSIS — G4733 Obstructive sleep apnea (adult) (pediatric): Secondary | ICD-10-CM | POA: Insufficient documentation

## 2023-02-14 NOTE — Progress Notes (Signed)
This degree of severity in apnea needs to be treated with a positive airway pressure device.  If CPAP should be uncomfortable for the patient, we can consider BiPAP instead.  I will start her off with an auto- titration CPAP device by [employer] with the settings between 5 and 18 cmH2O pressure 2 cm expiratory relief, heated humidification and an interface of the patient's choice.   As she is happily sleeping supine and is snoring, she may do well with a fullface mask.  I always recommend weight loss as a means of reducing the apnea hypopnea index.

## 2023-02-14 NOTE — Procedures (Signed)
[facility]  Elizabeth Stanton "Tori" 32 year old female 05/06/1991   HOME SLEEP TEST REPORT ( by Watch PAT)   STUDY DATE:  02-08-2023   ORDERING CLINICIAN: Melvyn Novas, MD  REFERRING CLINICIAN: Sagardia,MD    CLINICAL INFORMATION/HISTORY: I have the pleasure of seeing Elizabeth Stanton on 01/16/23 a left-handed AA female with a possible sleep disorder.  Elizabeth Stanton is a married 33 year old female who was born into a military family and she reports that as an infant when she was crawling she neglected to use her right side.  Her mother made the pediatricians aware of this and she was diagnosed with palsy.  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  She has a history of pulmonary embolism  in 2020.  At this time she does no longer require anticoagulation.   She snores.  She went on a vacation with her family and while her husband was not concerned about her sleep breathing , her mother was.  She has been witnessing irregular breathing and believes that the daughter has sleep apnea.  She was also sensitized because she has a husband with sleep apnea. The patient has become heavier over the last 3 years , reached over 200 pounds, gained about 20 in that time frame.        Epworth sleepiness score:5 /24. FSS at  14/ 63    BMI: 39 kg/m   Neck Circumference: 17   FINDINGS:   Sleep Summary:   Total Recording Time (hours, min): 9 hours 25 minutes  Total Sleep Time (hours, min): 8 hours 33 minutes               Percent REM (%):   27%     REM sleep latency was only 30 minutes according to this home sleep test and total sleep latency was 17 minutes, there were 34 minutes of wakefulness after sleep onset .                                  Respiratory Indices:   Calculated pAHI (per hour):   53/h    the home sleep test device did not indicate the presence of central apneas.                      REM pAHI:    59/h                                              NREM pAHI:    51/h                          Positional AHI: The patient slept almost equal amounts of time in supine and nonsupine positions.  Supine sleep was associated with an AHI of 42/h none supine sleep with an AHI of 63/h which is an unusual distribution.  She had higher AHI while s in prone sleep ,on her right and on her left.  Snoring   mean volume was 41 dB snoring was present for half of the total recorded sleep time.  Oxygen Saturation Statistics:     O2 Saturation Range (%): Between a nadir of 86 and a maximum saturation of 99% with a mean saturation of 93%                                     O2 Saturation (minutes) <89%:    2 minutes       Pulse Rate Statistics:   Pulse Mean (bpm):   78 bpm              Pulse Range:    56 through 112 bpm             IMPRESSION:  This HST confirms the presence of severe obstructive sleep apnea with an AHI of 53 /h that was not REM sleep dependent and was actually less severe in supine sleep.   short REM sleep onset. This degree of severity in apnea needs to be treated with a positive airway pressure device.  If CPAP should be uncomfortable for the patient we can consider BiPAP instead.  I will start her off with an auto titration CPAP device by ResMed with the settings between 5 and 18 cmH2O pressure 2 cm expiratory relief, heated humidification and an interface of the patient's choice.  As she is happily sleeping supine and is snoring she may do well with a fullface mask.  I always recommend weight loss as a means of reducing the apnea hypopnea index.    RECOMMENDATION:    INTERPRETING PHYSICIAN:   Elizabeth Novas, MD

## 2023-02-15 ENCOUNTER — Telehealth: Payer: Self-pay | Admitting: Neurology

## 2023-02-15 NOTE — Telephone Encounter (Signed)
I called pt. I advised pt that Dr. Vickey Huger reviewed their sleep study results and found that pt has severe OSA. Dr. Vickey Huger recommends that pt starts auto CPAP. I reviewed PAP compliance expectations with the pt. Pt is agreeable to starting a CPAP. I advised pt that an order will be sent to a DME, [facility], and [facility] will call the pt within about one week after they file with the pt's insurance. [facility] will show the pt how to use the machine, fit for masks, and troubleshoot the CPAP if needed. A follow up appt was made for insurance purposes with Ihor Austin, NP on 05/07/2023 at 2pm. Pt verbalized understanding to arrive 15 minutes early and bring their CPAP. Pt verbalized understanding of results. Pt had no questions at this time but was encouraged to call back if questions arise. I have sent the order to [facility] and have received confirmation that they have received the order.

## 2023-02-15 NOTE — Telephone Encounter (Signed)
-----   Message from [place] Dohmeier sent at 02/14/2023  6:13 PM EDT ----- This degree of severity in apnea needs to be treated with a positive airway pressure device.  If CPAP should be uncomfortable for the patient, we can consider BiPAP instead.  I will start her off with an auto- titration CPAP device by ResMed with the settings between 5 and 18 cmH2O pressure 2 cm expiratory relief, heated humidification and an interface of the patient's choice.   As she is happily sleeping supine and is snoring, she may do well with a fullface mask.  I always recommend weight loss as a means of reducing the apnea hypopnea index.

## 2023-02-15 NOTE — Telephone Encounter (Signed)
Pt is asking for a call back from [place] re: missed call from 12:05

## 2023-02-15 NOTE — Telephone Encounter (Signed)
Called patient to discuss sleep study results. No answer at this time. LVM for the patient to call back.  Will send a mychart message  

## 2023-03-09 ENCOUNTER — Telehealth: Payer: Self-pay | Admitting: Neurology

## 2023-03-09 NOTE — Telephone Encounter (Signed)
Called pt and LVM stating that she is needing to schedule her Initial Cpap visit. DME and between dates are : [facility] between dates are: 04/02/23-06/02/23

## 2023-03-15 ENCOUNTER — Ambulatory Visit: Payer: BC Managed Care – PPO | Admitting: Adult Health

## 2023-05-07 ENCOUNTER — Ambulatory Visit: Payer: BC Managed Care – PPO | Admitting: Adult Health

## 2023-05-17 ENCOUNTER — Encounter: Payer: Self-pay | Admitting: Neurology

## 2023-05-17 ENCOUNTER — Ambulatory Visit: Payer: BC Managed Care – PPO | Admitting: Neurology

## 2023-05-17 VITALS — BP 133/87 | HR 77 | Ht 64.0 in | Wt 236.0 lb

## 2023-05-17 DIAGNOSIS — D6859 Other primary thrombophilia: Secondary | ICD-10-CM | POA: Diagnosis not present

## 2023-05-17 DIAGNOSIS — Z86711 Personal history of pulmonary embolism: Secondary | ICD-10-CM | POA: Diagnosis not present

## 2023-05-17 DIAGNOSIS — G809 Cerebral palsy, unspecified: Secondary | ICD-10-CM

## 2023-05-17 DIAGNOSIS — G4733 Obstructive sleep apnea (adult) (pediatric): Secondary | ICD-10-CM

## 2023-05-17 NOTE — Progress Notes (Signed)
Provider:  Melvyn Novas, MD  Primary Care Physician:  Georgina Quint, MD [address] 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   Chief concern according to patient :  New to CPAP. HST was performed on 02-07-2023 with an AHI (per hour):   53/h   - the home sleep test device did not indicate the presence of central apneas.  Was what I like and she started on CPAP and has had a strong performance with high compliance of 97% by days she contracted a cold over the last week which dropped the compliance briefly.  The average user time is 6 hours 35 minutes, the machine is set between 5 and 18 cm water with 3 cm expiratory pressure relief.  Her residual apnea-hypopnea index is 2.6 down from 553/h.  The residual apneas are mostly still obstructive, the 95th percentile pressure used is 14.5 cm water which is very close to the upper limit of her current pressure setting.  Airleak is moderate.             I have the pleasure of seeing Elizabeth Stanton on 01/16/23 a left-handed AA female with a possible sleep disorder.  [name] is a married 32 year old female who was born into a military family and she reports that as an infant when she was crawling she neglected to use her right side.  Her mother made the pediatricians aware of this and she was diagnosed with palsy.  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  She has a history of pulmonary embolism  in 2020.  At this time she does no longer require anticoagulation.   She snores.  She went on a vacation with her family and  while her husband was not concerned about her sleep breathing , her mother was.  She has been witnessing irregular breathing and believes that the daughter has sleep apnea.  She was also sensitized because she has a husband with sleep apnea. The patient has become heavier over the last 3 years , reached over 200 pounds, gained about 20 in that time frame.     1991/02/05 birth in [place], history of premature birth, 3 months early(?) .  Brother had similar premature birth,  she has palsy, he has strabism, he has incubator eye disease.    2) snoring , weight gain, witnessed apneas. Risk factor  BMI of 39.3 and asymmetric airway, neck size.   He will screen for OSA-     Pre CPAP - Epworth sleepiness score:  05 /24. FSS at  14/ 63 .    Review of Systems: Out of a complete 14 system review, the patient complains of only the following symptoms, and all other reviewed systems are negative.:  Fatigue, sleepiness , snoring, fragmented sleep, Insomnia, RLS,    How likely are you to doze in the following situations: 0 = not likely, 1 = slight chance, 2 = moderate chance, 3 = high  chance   Sitting and Reading? Watching Television? Sitting inactive in a public place (theater or meeting)? As a passenger in a car for an hour without a break? Lying down in the afternoon when circumstances permit? Sitting and talking to someone? Sitting quietly after lunch without alcohol? In a car, while stopped for a few minutes in traffic?   Total = 6/ 24 points   FSS endorsed at 20/ 63 points.   Social History   Socioeconomic History   Marital status: Married    Spouse name: Not on file   Number of children: Not on file   Years of education: Not on file   Highest education level: Not on file  Occupational History   Not on file  Tobacco Use   Smoking status: Never   Smokeless tobacco: Never  Vaping Use   Vaping status: Never Used  Substance and Sexual Activity   Alcohol use: Yes    Alcohol/week:  1.0 standard drink of alcohol    Types: 1 Standard drinks or equivalent per week    Comment: wine   Drug use: No   Sexual activity: Yes    Partners: Male    Birth control/protection: I.U.D.    Comment: Was taken off of my birth control due to a blood clot  Other Topics Concern   Not on file  Social History Narrative   Not on file   Social Determinants of Health   Financial Resource Strain: Not on file  Food Insecurity: Not on file  Transportation Needs: Not on file  Physical Activity: Not on file  Stress: Not on file  Social Connections: Not on file    Family History  Problem Relation Age of Onset   Hypertension Mother    Diabetes Mother    Sleep apnea Father    Hypertension Maternal Grandmother    Cancer Paternal Grandfather        skin    Past Medical History:  Diagnosis Date   Allergy Lavender   Anxiety 11/02/2020   Cerebral palsy (HCC)    Depression 11/02/2020   Dysmenorrhea    History of abnormal cervical Pap smear    + HPV   History of cerebral palsy    History of pulmonary embolism    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 Add to the 20 mg tablet for a total dose of 30 mg daily 90 tablet 3   citalopram (CELEXA) 20 MG tablet Take one tablet a day. Add to the 10 mg tablet for a total dose of 30 mg a day. 90 tablet 3   levonorgestrel (MIRENA) 20 MCG/24HR IUD 1 each by Intrauterine route once.     No current facility-administered medications on file prior to visit.    Allergies  Allergen Reactions   Lavender Oil Hives     DIAGNOSTIC DATA (LABS, IMAGING, TESTING) - I reviewed patient records, labs, notes, testing and imaging myself where available.  Lab Results  Component Value Date   WBC 7.3  12/11/2022   HGB 14.0 12/11/2022   HCT 41.6 12/11/2022   MCV 91.2 12/11/2022   PLT 502.0 (H) 12/11/2022      Component Value Date/Time   NA 137 12/11/2022  0947   NA 139 09/24/2019 1112   K 4.6 12/11/2022 0947   CL 102 12/11/2022 0947   CO2 29 12/11/2022 0947   GLUCOSE 87 12/11/2022 0947   BUN 9 12/11/2022 0947   BUN 9 09/24/2019 1112   CREATININE 0.69 12/11/2022 0947   CREATININE 0.73 11/01/2020 1418   CALCIUM 9.5 12/11/2022 0947   PROT 8.4 (H) 12/11/2022 0947   PROT 8.0 09/24/2019 1112   ALBUMIN 4.1 12/11/2022 0947   ALBUMIN 4.0 09/24/2019 1112   AST 19 12/11/2022 0947   ALT 15 12/11/2022 0947   ALKPHOS 56 12/11/2022 0947   BILITOT 0.4 12/11/2022 0947   BILITOT <0.2 09/24/2019 1112   GFRNONAA 112 09/24/2019 1112   GFRAA 129 09/24/2019 1112   Lab Results  Component Value Date   CHOL 144 12/11/2022   HDL 47.10 12/11/2022   LDLCALC 82 12/11/2022   TRIG 74.0 12/11/2022   CHOLHDL 3 12/11/2022   Lab Results  Component Value Date   HGBA1C 5.5 12/11/2022   No results found for: "VITAMINB12" Lab Results  Component Value Date   TSH 1.85 11/01/2020    PHYSICAL EXAM:  Today's Vitals   05/17/23 0808  BP: 133/87  Pulse: 77  Weight: 236 lb (107 kg)  Height: 5\' 4"  (1.626 m)   Body mass index is 40.51 kg/m.   Wt Readings from Last 3 Encounters:  05/17/23 236 lb (107 kg)  01/16/23 229 lb (103.9 kg)  01/10/23 227 lb 3.2 oz (103.1 kg)     Ht Readings from Last 3 Encounters:  05/17/23 5\' 4"  (1.626 m)  01/16/23 5\' 4"  (1.626 m)  01/10/23 5' 4.57" (1.64 m)      General: The patient is awake, alert and appears not in acute distress. The patient is well groomed. Head: Normocephalic, atraumatic.  The patient is awake, alert and appears not in acute distress. The patient is well groomed. Head: Normocephalic, atraumatic. Neck is supple.  Mallampati 2-3,  neck circumference:17 inches . Nasal airflow  patent.  Retrognathia is seen.  Dental status: biological   Cardiovascular:  Regular rate and cardiac rhythm by pulse,  without distended neck veins. Respiratory: Lungs are clear to auscultation.  Skin:  With evidence of ankle edema, or rash. Trunk: The patient's posture is erect.   NEUROLOGIC EXAM: The patient is awake and alert, oriented to place and time.   Memory subjective described as intact.  Attention span & concentration ability appears normal.  Speech is fluent,  without  dysarthria, dysphonia or aphasia.  Mood and affect are appropriate.   Cranial nerves: no loss of smell or taste reported  Pupils are equal and briskly reactive to light. Funduscopic exam deferred.  Extraocular movements in vertical and horizontal planes were intact and without nystagmus. No Diplopia. Visual fields by finger perimetry are intact. Hearing was intact to soft voice and finger rubbing.  Right side decreased volume.   Facial sensation intact to fine touch.  Facial motor strength is symmetric and tongue and uvula move midline.  Neck ROM : rotation, tilt and flexion extension were normal for age The shoulder shrug was asymmetrical, the right shoulder is diminutive, the arm smaller , the hands smaller.    Motor exam:  bulk, tone are smaller in the right body half.  asymmetric grip strength . Right side unable to grip.   Gait and station: Patient could rise unassisted from a seated position, walked without assistive device.  Deep tendon reflexes: in the  upper and lower  extremities are brisk on the right .   ASSESSMENT AND PLAN 32 y.o. year old female  here with:    1) high CPAP compliance, good reduction in severe OSA, AHI less than 5/h.   2) she has noted progressive weakness in her plegic arm and hand- may be subjective , but it would be good to resume some exercises with OT. She used to be treated with electrostimulation and Botox while in the [employer].     I plan to follow up either personally or through our NP within 12 months.   I would like  to thank Georgina Quint, MD and Edwina Barth [place], [place] [address] [place],  [place] [address] for allowing me to meet with and to take care of this pleasant patient.     After spending a total time of  20  minutes face to face and additional time for physical and neurologic examination, review of laboratory studies,  personal review of imaging studies, reports and results of other testing and review of referral information / records as far as provided in visit,   Electronically signed by: Melvyn Novas, MD 05/17/2023 8:40 AM  [facility] Board certified by The [employer] of Sleep Medicine and Diplomate of the [employer] of Sleep Medicine. Board certified In Neurology through the ABPN, Fellow of the [employer] of Neu[employer].

## 2023-05-17 NOTE — Patient Instructions (Signed)
OSA : on CPAP  high CPAP compliance, good reduction in severe OSA, AHI less than 5/h.   2) she has noted progressive weakness in her plegic arm and hand- may be subjective , but it would be good to resume some exercises with OT. She used to be treated with electrostimulation and Botox while in the [employer].

## 2023-09-11 ENCOUNTER — Telehealth: Payer: 59 | Admitting: Nurse Practitioner

## 2023-09-11 DIAGNOSIS — J069 Acute upper respiratory infection, unspecified: Secondary | ICD-10-CM | POA: Diagnosis not present

## 2023-09-11 MED ORDER — BENZONATATE 100 MG PO CAPS: 100.0000 mg | ORAL_CAPSULE | Freq: Three times a day (TID) | ORAL | 0 refills | Status: DC | PRN

## 2023-09-11 MED ORDER — IPRATROPIUM BROMIDE 0.03 % NA SOLN: 2.0000 | Freq: Two times a day (BID) | NASAL | 12 refills | Status: DC

## 2023-09-11 NOTE — Progress Notes (Signed)
 Elizabeth Stanton,  E-Visit for Upper Respiratory Infection   We are sorry you are not feeling well.  Here is how we plan to help!  Based on what you have shared with me, it looks like you may have a viral upper respiratory infection.  Upper respiratory infections are caused by a large number of viruses; however, rhinovirus is the most common cause. We do not typically recommend Tamiflu (anti-viral) if you have had symptoms for longer than 48 hours.   Antibiotics are generally not needed until 7-10 days of consistent symptoms- below are some recommendations and prescriptions for managing your symptoms   Symptoms vary from person to person, with common symptoms including sore throat, cough, fatigue or lack of energy and feeling of general discomfort.  A low-grade fever of up to 100.4 may present, but is often uncommon.  Symptoms vary however, and are closely related to a person's age or underlying illnesses.  The most common symptoms associated with an upper respiratory infection are nasal discharge or congestion, cough, sneezing, headache and pressure in the ears and face.  These symptoms usually persist for about 3 to 10 days, but can last up to 2 weeks.  It is important to know that upper respiratory infections do not cause serious illness or complications in most cases.    Upper respiratory infections can be transmitted from person to person, with the most common method of transmission being a person's hands.  The virus is able to live on the skin and can infect other persons for up to 2 hours after direct contact.  Also, these can be transmitted when someone coughs or sneezes; thus, it is important to cover the mouth to reduce this risk.  To keep the spread of the illness at bay, good hand hygiene is very important.  This is an infection that is most likely caused by a virus. There are no specific treatments other than to help you with the symptoms until the infection runs its course.  We are sorry you are not  feeling well.  Here is how we plan to help!   For nasal congestion, you may use an oral decongestants such as Mucinex D or if you have glaucoma or high blood pressure use plain Mucinex.  Saline nasal spray or nasal drops can help and can safely be used as often as needed for congestion.  For your congestion, I have prescribed Ipratropium Bromide nasal spray 0.03% two sprays in each nostril 2-3 times a day  If you do not have a history of heart disease, hypertension, diabetes or thyroid disease, prostate/bladder issues or glaucoma, you may also use Sudafed to treat nasal congestion.  It is highly recommended that you consult with a pharmacist or your primary care physician to ensure this medication is safe for you to take.     If you have a cough, you may use cough suppressants such as Delsym and Robitussin.  If you have glaucoma or high blood pressure, you can also use Coricidin HBP.   For cough I have prescribed for you A prescription cough medication called Tessalon Perles 100 mg. You may take 1-2 capsules every 8 hours as needed for cough  If you have a sore or scratchy throat, use a saltwater gargle-  to  teaspoon of salt dissolved in a 4-ounce to 8-ounce glass of warm water.  Gargle the solution for approximately 15-30 seconds and then spit.  It is important not to swallow the solution.  You can also use throat  lozenges/cough drops and Chloraseptic spray to help with throat pain or discomfort.  Warm or cold liquids can also be helpful in relieving throat pain.  For headache, pain or general discomfort, you can use Ibuprofen or Tylenol as directed.   Some authorities believe that zinc sprays or the use of Echinacea may shorten the course of your symptoms.   HOME CARE Only take medications as instructed by your medical team. Be sure to drink plenty of fluids. Water is fine as well as fruit juices, sodas and electrolyte beverages. You may want to stay away from caffeine or alcohol. If you are  nauseated, try taking small sips of liquids. How do you know if you are getting enough fluid? Your urine should be a pale yellow or almost colorless. Get rest. Taking a steamy shower or using a humidifier may help nasal congestion and ease sore throat pain. You can place a towel over your head and breathe in the steam from hot water coming from a faucet. Using a saline nasal spray works much the same way. Cough drops, hard candies and sore throat lozenges may ease your cough. Avoid close contacts especially the very young and the elderly Cover your mouth if you cough or sneeze Always remember to wash your hands.   GET HELP RIGHT AWAY IF: You develop worsening fever. If your symptoms do not improve within 10 days You develop yellow or green discharge from your nose over 3 days. You have coughing fits You develop a severe head ache or visual changes. You develop shortness of breath, difficulty breathing or start having chest pain Your symptoms persist after you have completed your treatment plan  MAKE SURE YOU  Understand these instructions. Will watch your condition. Will get help right away if you are not doing well or get worse.  Thank you for choosing an e-visit.  Your e-visit answers were reviewed by a board certified advanced clinical practitioner to complete your personal care plan. Depending upon the condition, your plan could have included both over the counter or prescription medications.  Please review your pharmacy choice. Make sure the pharmacy is open so you can pick up prescription now. If there is a problem, you may contact your provider through [employer] and have the prescription routed to another pharmacy.  Your safety is important to [place]. If you have drug allergies check your prescription carefully.   For the next 24 hours you can use MyChart to ask questions about today's visit, request a non-urgent call back, or ask for a work or school excuse. You will get an  email in the next two days asking about your experience. I hope that your e-visit has been valuable and will speed your recovery.  I spent approximately 5 minutes reviewing the patient's history, current symptoms and coordinating their care today.

## 2023-10-23 ENCOUNTER — Telehealth: Payer: Self-pay | Admitting: Plastic Surgery

## 2023-10-23 NOTE — Telephone Encounter (Signed)
 provider out of the office, provider will be with a sx walk through, called [facility] 10-23-23

## 2023-10-26 ENCOUNTER — Institutional Professional Consult (permissible substitution): Payer: 59 | Admitting: Plastic Surgery

## 2023-11-05 ENCOUNTER — Encounter: Payer: Self-pay | Admitting: Plastic Surgery

## 2023-11-05 ENCOUNTER — Ambulatory Visit: Admitting: Plastic Surgery

## 2023-11-05 VITALS — BP 126/85 | HR 82 | Ht 64.0 in | Wt 241.8 lb

## 2023-11-05 DIAGNOSIS — Z86711 Personal history of pulmonary embolism: Secondary | ICD-10-CM

## 2023-11-05 DIAGNOSIS — N62 Hypertrophy of breast: Secondary | ICD-10-CM

## 2023-11-05 DIAGNOSIS — G8929 Other chronic pain: Secondary | ICD-10-CM

## 2023-11-05 DIAGNOSIS — G4733 Obstructive sleep apnea (adult) (pediatric): Secondary | ICD-10-CM | POA: Diagnosis not present

## 2023-11-05 DIAGNOSIS — M546 Pain in thoracic spine: Secondary | ICD-10-CM | POA: Diagnosis not present

## 2023-11-05 DIAGNOSIS — M542 Cervicalgia: Secondary | ICD-10-CM

## 2023-11-05 DIAGNOSIS — G809 Cerebral palsy, unspecified: Secondary | ICD-10-CM

## 2023-11-05 NOTE — Progress Notes (Signed)
 Patient ID: Elizabeth Stanton, female    DOB: 15-Jun-1991, 33 y.o.   MRN: [id number]   Chief Complaint  Patient presents with   Consult   Breast Problem    Mammary Hyperplasia: The patient is a 33 y.o. female with a history of mammary hyperplasia for several years.  She has extremely large breasts causing symptoms that include the following: Back pain in the upper and lower back, including neck pain. She pulls or pins her bra straps to provide better lift and relief of the pressure and pain. She notices relief by holding her breast up manually.  Her shoulder straps cause grooves and pain and pressure that requires padding for relief. Pain medication is sometimes required with motrin and tylenol.  Activities that are hindered by enlarged breasts include: exercise and running.  She has tried supportive clothing as well as fitted bras without improvement.  Her breasts are extremely large and fairly symmetric but the right is slightly smaller.  She has hyperpigmentation of the inframammary area on both sides.  The sternal to nipple distance on the right is 33 cm and the left is 35 cm.  The IMF distance is 20 cm.  She is 5 feet 4 inches tall and weighs 241 pounds.  The BMI = 41.4 kg/m.  Preoperative bra size = 42 G cup.  The estimated excess breast tissue to be removed at the time of surgery = 750-800 grams on the left and 750-800 grams on the right.  Mammogram history: none.  Family history of breast cancer:  none.  Tobacco use:  none.   The patient expresses the desire to pursue surgical intervention.  The patient has cerebral palsy but is very active with the majority of her limitation being in the right arm.  She had a DVT while on oral birth control.  She was seen by a hematologist and they are not sure why she had it.  She was warfarin for some time but is currently not on it and was told she does not need it.     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 Add to the 20 mg tablet for a total dose of 30 mg daily, Disp: 90 tablet, Rfl: 3   citalopram (CELEXA) 20 MG tablet, Take one tablet a day. Add to the 10 mg tablet for a total dose of 30 mg a day., Disp: 90 tablet, Rfl: 3   ipratropium (ATROVENT) 0.03 % nasal spray, Place 2 sprays into both nostrils every 12 (twelve) hours., Disp: 30 mL, Rfl: 12   levonorgestrel (MIRENA) 20 MCG/24HR IUD, 1 each by Intrauterine route once., Disp: , Rfl:    benzonatate (TESSALON) 100 MG capsule, Take 1 capsule (100 mg total) by mouth 3 (three) times daily as needed., Disp: 30 capsule, Rfl: 0   Objective:   Vitals:   11/05/23 0832  BP: 126/85  Pulse:  82  SpO2: 95%    Physical Exam  Assessment & Plan:  Severe obstructive sleep apnea-hypopnea syndrome - Plan: Ambulatory referral to Physical Therapy  Cerebral palsy, unspecified type (HCC) - Plan: Ambulatory referral to Physical Therapy  Personal history of pulmonary embolism - Plan: Ambulatory referral to Physical Therapy  Symptomatic mammary hypertrophy  Chronic bilateral thoracic back pain  The procedure the patient selected and that was best for the patient was discussed. The risk were discussed and include but not limited to the following:  Breast asymmetry, fluid accumulation, firmness of the breast, inability to breast feed, loss of nipple or areola, skin loss, change in skin and nipple sensation, fat necrosis of the breast tissue, bleeding, infection and healing delay.  There are risks of anesthesia and injury  to nerves or blood vessels.  Allergic reaction to tape, suture and skin glue are possible.  There will be swelling.  Any of these can lead to the need for revisional surgery which is not included in this surgery.  A breast reduction has potential to interfere with diagnostic procedures in the future.  This procedure is best done when the breast is fully developed.  Changes in the breast will continue to occur over time: pregnancy, weight gain or weigh loss. No guarantees are given for a certain bra or breast size.    Total time: 40 minutes. This includes time spent with the patient during the visit as well as time spent before and after the visit reviewing the chart, documenting the encounter, ordering pertinent studies and literature for the patient.   Physical therapy:  ordered Mammogram:  not required  The patient is a good candidate for bilateral breast reduction with liposuction.  We will get a clearance from her hematologist to be sure whether or not she needs anticoagulation for the surgery.  She will come and see us  after she finishes with physical therapy.  Pictures were obtained of the patient and placed in the chart with the patient's or guardian's permission.   Lindaann Requena Wrenna Saks, DO

## 2023-11-07 ENCOUNTER — Telehealth: Payer: Self-pay

## 2023-11-07 NOTE — Telephone Encounter (Signed)
 Patient called stating her hematologist is Dr. Benson Brawn with [facility].

## 2023-11-09 NOTE — Telephone Encounter (Addendum)
 Called patient, LMVM and sent message via MyChart advising Dr. Windell Singh is no longer with [facility]. Inquired if she has a new hematologist.   If so, we need the name of physician.  If not, need to set up a referral for surgical clearance for breast surgery due to HX of DVT and was previously on Warfin and is no longer on the medication.  In reviewing patients chart, it was noted that Dr. Dara Schanz Sagardia provided a prescription of Xarelto  in 2021.  Sent another message via MyChart to patient if he is the only physician that prescribed the anticoagulates.

## 2023-11-22 ENCOUNTER — Ambulatory Visit: Attending: Plastic Surgery

## 2023-11-22 DIAGNOSIS — G809 Cerebral palsy, unspecified: Secondary | ICD-10-CM | POA: Insufficient documentation

## 2023-11-22 DIAGNOSIS — R293 Abnormal posture: Secondary | ICD-10-CM | POA: Diagnosis present

## 2023-11-22 DIAGNOSIS — R2689 Other abnormalities of gait and mobility: Secondary | ICD-10-CM | POA: Insufficient documentation

## 2023-11-22 DIAGNOSIS — G4733 Obstructive sleep apnea (adult) (pediatric): Secondary | ICD-10-CM | POA: Diagnosis not present

## 2023-11-22 DIAGNOSIS — Z86711 Personal history of pulmonary embolism: Secondary | ICD-10-CM | POA: Diagnosis not present

## 2023-11-22 DIAGNOSIS — M6281 Muscle weakness (generalized): Secondary | ICD-10-CM | POA: Insufficient documentation

## 2023-11-22 NOTE — Therapy (Signed)
 OUTPATIENT PHYSICAL THERAPY NEURO EVALUATION   Patient Name: Elizabeth Stanton MRN: [id number] DOB:1990/12/19, 33 y.o., female Today's Date: 11/23/2023   PCP: Dr. Yvonnie Heritage REFERRING PROVIDER: Gilles Lacks, DO  END OF SESSION:  PT End of Session - 11/22/23 1522     Visit Number 1    Number of Visits 6    Date for PT Re-Evaluation 01/04/24    Authorization Type aetna    PT Start Time 1525    PT Stop Time 1613    PT Time Calculation (min) 48 min    Activity Tolerance Patient tolerated treatment well    Behavior During Therapy [facility] for tasks assessed/performed             Past Medical History:  Diagnosis Date   Allergy Lavender   Anxiety 11/02/2020   Cerebral palsy (HCC)    Depression 11/02/2020   Dysmenorrhea    History of abnormal cervical Pap smear    + HPV   History of cerebral palsy    History of pulmonary embolism    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 [facility])    Past Surgical History:  Procedure Laterality Date   COLPOSCOPY     Patient Active Problem List   Diagnosis Date Noted   Symptomatic mammary hypertrophy 11/05/2023   Personal history of pulmonary embolism 05/17/2023   Severe obstructive sleep apnea-hypopnea syndrome 02/14/2023   Snoring 01/16/2023   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   Cerebral palsy (HCC)    Back pain 10/17/2017   Right hemiplegia (HCC) 10/17/2017   History of cerebral palsy     ONSET DATE: 11/05/23 referral  REFERRING DIAG:  G47.33 (ICD-10-CM) - Severe obstructive sleep apnea-hypopnea syndrome  G80.9 (ICD-10-CM) - Cerebral palsy, unspecified type (HCC)  Z86.711 (ICD-10-CM) - Personal history of pulmonary embolism    THERAPY DIAG:  Abnormal posture - Plan: PT plan of care cert/re-cert  Muscle weakness (generalized) - Plan: PT plan of care cert/re-cert  Other abnormalities of gait and mobility - Plan: PT plan of care cert/re-cert  Rationale for Evaluation and Treatment: Rehabilitation  SUBJECTIVE:                                                                                                                                                                                              SUBJECTIVE STATEMENT: Patient arrives to clinic alone. No AD- does not use an AD. Wants to get a breast reduction and needs to do PT first. Has baseline hemiplegia due to CP. As her breasts have grown, her back has continued to hurt. Her mobility was already impaired due to her hemiplegia,  but this has contributed significantly. She is unable to don her R ankle brace independently due to mammary hypertrophy and has significant difficulty donning a bra due to R UE impaired dexterity and mammary hypertrophy.  Pt accompanied by: self  PERTINENT HISTORY: CP, anxiety, depression, h/o PE  PAIN:  Are you having pain? Yes: NPRS scale: 7/10 Pain location: Mid and lower back Pain description: burning, aching  Aggravating factors: movement, work, wearing a bra Relieving factors: removing bra (short lived)   PRECAUTIONS: None  RED FLAGS: None   WEIGHT BEARING RESTRICTIONS: No  FALLS: Has patient fallen in last 6 months? No  LIVING ENVIRONMENT: Lives with: lives with their family Lives in: House/apartment Stairs: No Has following equipment at home: None  PLOF: Independent  PATIENT GOALS: "I'm hoping it will make me feel better"  OBJECTIVE:  Note: Objective measures were completed at Evaluation unless otherwise noted.  DIAGNOSTIC FINDINGS: non contributory   COGNITION: Overall cognitive status: Within functional limits for tasks assessed   SENSATION: Intermittent R UE numbness, including R breast  POSTURE: rounded shoulders   BED MOBILITY:  Endorses difficulty moving from R sidelying-> sitting   FUNCTIONAL TESTS:   [facility] PT Assessment - 11/22/23 0001       Standardized Balance Assessment   Standardized Balance Assessment Timed Up and Go Test    Five times sit to stand comments  20.72 L UE       Timed Up and Go Test   Normal TUG (seconds) 13              PATIENT SURVEYS:  Modified Oswestry 16  NDI 20                                                                                                                              TREATMENT   Self care/home management: -benefit of PT and expected outcomes  -request for referral to OT for R UE  Therex: -HEP (see below)    PATIENT EDUCATION: Education details: PT POC, exam findings, initial HEP Person educated: Patient Education method: Explanation and Handouts Education comprehension: verbalized understanding  HOME EXERCISE PROGRAM: Access Code: [email or web address] URL: [email or web address] Date: 11/22/2023 Prepared by: Nickola Baron  Exercises - Shoulder extension with resistance - Neutral  - 1 x daily - 7 x weekly - 3 sets - 10 reps - Scapular Retraction with Resistance  - 1 x daily - 7 x weekly - 3 sets - 10 reps - Cat Cow  - 1 x daily - 7 x weekly - 3 sets - 10 reps - Doorway Pec Stretch at 90 Degrees Abduction  - 1 x daily - 7 x weekly - 3 sets - 30s hold  GOALS: Goals reviewed with patient? Yes  SHORT TERM GOALS: Target date: 12/14/23  Pt will be independent with initial HEP for improved functional strength and pain management   Baseline: to be updated Goal status: INITIAL   LONG TERM GOALS: Target  date: 01/04/24  Pt will be independent with final HEP for improved functional strength and pain management Baseline: to be updated Goal status: INITIAL  2.  Patient will improve NDI score to </=16 to demonstrate reduction in her neck pain Baseline: 20 Goal status: INITIAL  3.  Patient will improve her Oswestry score to </=10 to demonstrate a reduction in her back pain Baseline: 16 Goal status: INITIAL  4.  Pt will improve 5x STS to </= 15 sec to demo improved functional LE strength and balance   Baseline: 20.72 Goal status: INITIAL  ASSESSMENT:  CLINICAL IMPRESSION: Patient is a 33 y.o.  female who was seen today for physical therapy evaluation and treatment for back pain and neck pain. Five times Sit to Stand Test (FTSS) Method: Use a straight back chair with a solid seat that is 17-18" high. Ask participant to sit on the chair with arms folded across their chest.   Instructions: "Stand up and sit down as quickly as possible 5 times, keeping your arms folded across your chest."   Measurement: Stop timing when the participant touches the chair in sitting the 5th time.  TIME: 20.72 sec  Cut off scores indicative of increased fall risk: >12 sec CVA, >16 sec PD, >13 sec vestibular (ANPTA Core Set of Outcome Measures for Adults with Neurologic Conditions, 2018). She scored a 16 on the Oswestry reflecting mild disability related to her back pain and a 20 on the NDI also reflecting mild disability related to her neck pain. However, these are compounded by her chronic R hemiplegia due to her diagnosis of cerebral palsy. She is limited in her ability to participate in a traditional exercise program because of this and requires skilled interventions to address her above mentioned deficits.     OBJECTIVE IMPAIRMENTS: Abnormal gait, decreased mobility, difficulty walking, decreased strength, increased fascial restrictions, increased muscle spasms, impaired sensation, impaired tone, impaired UE functional use, improper body mechanics, postural dysfunction, and pain.   ACTIVITY LIMITATIONS: carrying, lifting, standing, sleeping, bed mobility, locomotion level, and caring for others  PARTICIPATION LIMITATIONS: interpersonal relationship, driving, shopping, community activity, occupation, and yard work  PERSONAL FACTORS: Age, Fitness, Past/current experiences, Profession, Sex, Time since onset of injury/illness/exacerbation, and 1 comorbidity: CP  are also affecting patient's functional outcome.   REHAB POTENTIAL: Fair etiology  CLINICAL DECISION MAKING: Stable/uncomplicated  EVALUATION  COMPLEXITY: Low  PLAN:  PT FREQUENCY: 1x/week  PT DURATION: 6 weeks  PLANNED INTERVENTIONS: 97164- PT Re-evaluation, 97750- Physical Performance Testing, 97110-Therapeutic exercises, 97530- Therapeutic activity, [id number]- Neuromuscular re-education, 97535- Self Care, 11914- Manual therapy, [phone]- Gait training, [phone]- Aquatic Therapy, [phone]- Electrical stimulation (manual), Patient/Family education, Balance training, Stair training, Taping, Dry Needling, Joint mobilization, Spinal mobilization, Vestibular training, Visual/preceptual remediation/compensation, DME instructions, Cryotherapy, and Moist heat  PLAN FOR NEXT SESSION: low back stretching, neck stretching, posture retraining, core strengthening    Rebecca Campus, PT Rebecca Campus, PT, DPT, CBIS  11/23/2023, 7:57 AM

## 2023-11-23 ENCOUNTER — Telehealth: Payer: Self-pay

## 2023-11-23 NOTE — Telephone Encounter (Signed)
 Dr. Orin Birk,  Elizabeth Stanton  was evaluated by PT on 11/22/23.  The patient would benefit from OT evaluation for R UE coordination.   If you agree, please place an order in [facility] workque in [facility] or fax the order to [phone].  Thank you,  Rebecca Campus, PT, DPT, [facility] [address] [place], [place]  [address] Phone:  [phone] Fax:  [phone]

## 2023-11-29 ENCOUNTER — Telehealth: Payer: Self-pay

## 2023-11-29 ENCOUNTER — Ambulatory Visit

## 2023-11-29 DIAGNOSIS — M6281 Muscle weakness (generalized): Secondary | ICD-10-CM

## 2023-11-29 DIAGNOSIS — R293 Abnormal posture: Secondary | ICD-10-CM | POA: Diagnosis not present

## 2023-11-29 DIAGNOSIS — R2689 Other abnormalities of gait and mobility: Secondary | ICD-10-CM

## 2023-11-29 NOTE — Telephone Encounter (Signed)
 FAXED ON 11/29/23  Yvonnie Heritage, NP,    Johnella Naas  was evaluated by PT on 11/22/23.  The patient would benefit from OT evaluation for R UE coordination and possible splinting.   If you agree, please place an order in [facility] workque in [facility] or fax the order to [phone].   Thank you,   Rebecca Campus, PT, DPT, [facility]  [address] [place], [place]  [address] Phone:  [phone] Fax:  [phone]

## 2023-11-29 NOTE — Therapy (Signed)
 OUTPATIENT PHYSICAL THERAPY NEURO EVALUATION   Patient Name: Elizabeth Stanton MRN: [id number] DOB:14-Feb-1991, 33 y.o., female Today's Date: 11/29/2023   PCP: Dr. Yvonnie Heritage REFERRING PROVIDER: Gilles Lacks, DO  END OF SESSION:  PT End of Session - 11/29/23 0846     Visit Number 2    Number of Visits 6    Date for PT Re-Evaluation 01/04/24    Authorization Type aetna    PT Start Time 0845    PT Stop Time 0932    PT Time Calculation (min) 47 min    Activity Tolerance Patient tolerated treatment well    Behavior During Therapy [facility] for tasks assessed/performed             Past Medical History:  Diagnosis Date   Allergy Lavender   Anxiety 11/02/2020   Cerebral palsy (HCC)    Depression 11/02/2020   Dysmenorrhea    History of abnormal cervical Pap smear    + HPV   History of cerebral palsy    History of pulmonary embolism    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 [facility])    Past Surgical History:  Procedure Laterality Date   COLPOSCOPY     Patient Active Problem List   Diagnosis Date Noted   Symptomatic mammary hypertrophy 11/05/2023   Personal history of pulmonary embolism 05/17/2023   Severe obstructive sleep apnea-hypopnea syndrome 02/14/2023   Snoring 01/16/2023   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   Cerebral palsy (HCC)    Back pain 10/17/2017   Right hemiplegia (HCC) 10/17/2017   History of cerebral palsy     ONSET DATE: 11/05/23 referral  REFERRING DIAG:  G47.33 (ICD-10-CM) - Severe obstructive sleep apnea-hypopnea syndrome  G80.9 (ICD-10-CM) - Cerebral palsy, unspecified type (HCC)  Z86.711 (ICD-10-CM) - Personal history of pulmonary embolism    THERAPY DIAG:  Abnormal posture  Muscle weakness (generalized)  Other abnormalities of gait and mobility  Rationale for Evaluation and Treatment: Rehabilitation  SUBJECTIVE:                                                                                                                                                                                              SUBJECTIVE STATEMENT: Patient arrives to clinic- reports doing well. Requesting note for electric standing desk. Did do the exercises and reports they're doing better.  Pt accompanied by: self  PERTINENT HISTORY: CP, anxiety, depression, h/o PE  PAIN:  Are you having pain? Yes: NPRS scale: 7/10 Pain location: Mid and lower back Pain description: burning, aching  Aggravating factors: movement, work, wearing a bra Relieving factors: removing bra (short lived)  PRECAUTIONS: None  PATIENT GOALS: "I'm hoping it will make me feel better"                                                                                                                              TREATMENT  Therex: -UE ergometer 3 mins fwd/3 min backward -prone I/Y/T -modified cat/cow with 2" box to elevate R UE  -anterior ball roll out  -iso core press down -seated levator scap/upper trap/ cervical paraspinals stretch   Manual: -Grade I/II A/P mobs to mid thoracic spine for pain relief with moist heat    PATIENT EDUCATION: Education details: expanded HEP Person educated: Patient Education method: Chief Technology Officer Education comprehension: verbalized understanding  HOME EXERCISE PROGRAM: Access Code: [email or web address] URL: [email or web address] Date: 11/22/2023 Prepared by: Nickola Baron  Exercises - Shoulder extension with resistance - Neutral  - 1 x daily - 7 x weekly - 3 sets - 10 reps - Scapular Retraction with Resistance  - 1 x daily - 7 x weekly - 3 sets - 10 reps - Cat Cow  - 1 x daily - 7 x weekly - 3 sets - 10 reps - Doorway Pec Stretch at 90 Degrees Abduction  - 1 x daily - 7 x weekly - 3 sets - 30s hold - Prone Scapular Slide with Shoulder Extension  - 1 x daily - 7 x weekly - 3 sets - 10 reps - Prone Shoulder Horizontal Abduction  - 1 x daily - 7 x weekly - 3 sets - 10 reps - Prone Shoulder Flexion  - 1 x daily -  7 x weekly - 3 sets - 10 reps - Seated 3 Way Exercise Ball Roll Out Stretch  - 1 x daily - 7 x weekly - 3 sets - 10 reps  GOALS: Goals reviewed with patient? Yes  SHORT TERM GOALS: Target date: 12/14/23  Pt will be independent with initial HEP for improved functional strength and pain management   Baseline: to be updated Goal status: INITIAL   LONG TERM GOALS: Target date: 01/04/24  Pt will be independent with final HEP for improved functional strength and pain management Baseline: to be updated Goal status: INITIAL  2.  Patient will improve NDI score to </=16 to demonstrate reduction in her neck pain Baseline: 20 Goal status: INITIAL  3.  Patient will improve her Oswestry score to </=10 to demonstrate a reduction in her back pain Baseline: 16 Goal status: INITIAL  4.  Pt will improve 5x STS to </= 15 sec to demo improved functional LE strength and balance   Baseline: 20.72 Goal status: INITIAL  ASSESSMENT:  CLINICAL IMPRESSION: Patient seen for skilled PT session with emphasis on postural retraining and pain relief. Continues to be limited in mobility, complicated by CP. Tolerated progression in strengthening and stretching well with minimal reports of pain. She would greatly benefit from a standing desk at work in order to improve the [employer] of her  work environment and prevent further worsening of her condition. Continue POC.     OBJECTIVE IMPAIRMENTS: Abnormal gait, decreased mobility, difficulty walking, decreased strength, increased fascial restrictions, increased muscle spasms, impaired sensation, impaired tone, impaired UE functional use, improper body mechanics, postural dysfunction, and pain.   ACTIVITY LIMITATIONS: carrying, lifting, standing, sleeping, bed mobility, locomotion level, and caring for others  PARTICIPATION LIMITATIONS: interpersonal relationship, driving, shopping, community activity, occupation, and yard work  PERSONAL FACTORS: Age,  Fitness, Past/current experiences, Profession, Sex, Time since onset of injury/illness/exacerbation, and 1 comorbidity: CP are also affecting patient's functional outcome.   REHAB POTENTIAL: Fair etiology  CLINICAL DECISION MAKING: Stable/uncomplicated  EVALUATION COMPLEXITY: Low  PLAN:  PT FREQUENCY: 1x/week  PT DURATION: 6 weeks  PLANNED INTERVENTIONS: 97164- PT Re-evaluation, 97750- Physical Performance Testing, 97110-Therapeutic exercises, 97530- Therapeutic activity, [id number]- Neuromuscular re-education, 97535- Self Care, 16109- Manual therapy, [phone]- Gait training, [phone]- Aquatic Therapy, [phone]- Electrical stimulation (manual), Patient/Family education, Balance training, Stair training, Taping, Dry Needling, Joint mobilization, Spinal mobilization, Vestibular training, Visual/preceptual remediation/compensation, DME instructions, Cryotherapy, and Moist heat  PLAN FOR NEXT SESSION: low back stretching, neck stretching, posture retraining, core strengthening    Rebecca Campus, PT Rebecca Campus, PT, DPT, CBIS  11/29/2023, 9:43 AM

## 2023-12-05 ENCOUNTER — Ambulatory Visit

## 2023-12-05 DIAGNOSIS — R293 Abnormal posture: Secondary | ICD-10-CM | POA: Diagnosis not present

## 2023-12-05 DIAGNOSIS — M6281 Muscle weakness (generalized): Secondary | ICD-10-CM

## 2023-12-05 DIAGNOSIS — R2689 Other abnormalities of gait and mobility: Secondary | ICD-10-CM

## 2023-12-05 NOTE — Therapy (Unsigned)
 OUTPATIENT PHYSICAL THERAPY NEURO TREATMENT   Patient Name: Elizabeth Stanton MRN: [id number] DOB:1991/07/20, 33 y.o., female Today's Date: 12/06/2023   PCP: Dr. Yvonnie Heritage REFERRING PROVIDER: Gilles Lacks, DO  END OF SESSION:  PT End of Session - 12/05/23 1535     Visit Number 3    Number of Visits 6    Date for PT Re-Evaluation 01/04/24    Authorization Type aetna    PT Start Time 1531    PT Stop Time 1612    PT Time Calculation (min) 41 min    Activity Tolerance Patient tolerated treatment well    Behavior During Therapy [facility] for tasks assessed/performed             Past Medical History:  Diagnosis Date   Allergy Lavender   Anxiety 11/02/2020   Cerebral palsy (HCC)    Depression 11/02/2020   Dysmenorrhea    History of abnormal cervical Pap smear    + HPV   History of cerebral palsy    History of pulmonary embolism    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 [facility])    Past Surgical History:  Procedure Laterality Date   COLPOSCOPY     Patient Active Problem List   Diagnosis Date Noted   Symptomatic mammary hypertrophy 11/05/2023   Personal history of pulmonary embolism 05/17/2023   Severe obstructive sleep apnea-hypopnea syndrome 02/14/2023   Snoring 01/16/2023   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   Cerebral palsy (HCC)    Back pain 10/17/2017   Right hemiplegia (HCC) 10/17/2017   History of cerebral palsy     ONSET DATE: 11/05/23 referral  REFERRING DIAG:  G47.33 (ICD-10-CM) - Severe obstructive sleep apnea-hypopnea syndrome  G80.9 (ICD-10-CM) - Cerebral palsy, unspecified type (HCC)  Z86.711 (ICD-10-CM) - Personal history of pulmonary embolism    THERAPY DIAG:  Abnormal posture  Muscle weakness (generalized)  Other abnormalities of gait and mobility  Rationale for Evaluation and Treatment: Rehabilitation  SUBJECTIVE:                                                                                                                                                                                              SUBJECTIVE STATEMENT: Patient reports doing well. PT provided patient with letter for standing frame at work. Denies falls.  Pt accompanied by: self  PERTINENT HISTORY: CP, anxiety, depression, h/o PE  PAIN:  Are you having pain? Yes: NPRS scale: 7/10 Pain location: Mid and lower back Pain description: burning, aching  Aggravating factors: movement, work, wearing a bra Relieving factors: removing bra (short lived)   PRECAUTIONS: None  PATIENT GOALS: "  I'm hoping it will make me feel better"                                                                                                                              TREATMENT  Therex: -scifit hills level 5 x B UE/LE for functional strengthening  -supine modified dead bug with 2lb DB -supine deep abd muscle pulses  -quadruped donkey kicks with 2" box under R UE to accommodate for limb length discrepancy  -resisted trunk rotation   PATIENT EDUCATION: Education details: expanded HEP Person educated: Patient Education method: Chief Technology Officer Education comprehension: verbalized understanding  HOME EXERCISE PROGRAM: Access Code: [email or web address] URL: [email or web address] Date: 11/22/2023 Prepared by: Nickola Baron  Exercises - Shoulder extension with resistance - Neutral  - 1 x daily - 7 x weekly - 3 sets - 10 reps - Scapular Retraction with Resistance  - 1 x daily - 7 x weekly - 3 sets - 10 reps - Cat Cow  - 1 x daily - 7 x weekly - 3 sets - 10 reps - Doorway Pec Stretch at 90 Degrees Abduction  - 1 x daily - 7 x weekly - 3 sets - 30s hold - Prone Scapular Slide with Shoulder Extension  - 1 x daily - 7 x weekly - 3 sets - 10 reps - Prone Shoulder Horizontal Abduction  - 1 x daily - 7 x weekly - 3 sets - 10 reps - Prone Shoulder Flexion  - 1 x daily - 7 x weekly - 3 sets - 10 reps - Seated 3 Way Exercise Ball Roll Out Stretch  - 1 x  daily - 7 x weekly - 3 sets - 10 reps - Supine Dead Bug with Leg Extension  - 1 x daily - 7 x weekly - 3 sets - 10 reps - Standing Trunk Rotation with Resistance  - 1 x daily - 7 x weekly - 3 sets - 10 reps  GOALS: Goals reviewed with patient? Yes  SHORT TERM GOALS: Target date: 12/14/23  Pt will be independent with initial HEP for improved functional strength and pain management   Baseline: to be updated Goal status: INITIAL   LONG TERM GOALS: Target date: 01/04/24  Pt will be independent with final HEP for improved functional strength and pain management Baseline: to be updated Goal status: INITIAL  2.  Patient will improve NDI score to </=16 to demonstrate reduction in her neck pain Baseline: 20 Goal status: INITIAL  3.  Patient will improve her Oswestry score to </=10 to demonstrate a reduction in her back pain Baseline: 16 Goal status: INITIAL  4.  Pt will improve 5x STS to </= 15 sec to demo improved functional LE strength and balance   Baseline: 20.72 Goal status: INITIAL  ASSESSMENT:  CLINICAL IMPRESSION: Patient seen for skilled PT session with emphasis on continuing postural strengthening and retraining. She does remain limited in her ability to perfect her body  mechanics while completing these exercises due to R hemi spasticity secondary to her d/o CP. This is unlikely to change and so PT will only be able to improve her back pain to a certain extent. Patient is aware of this. Continue POC.     OBJECTIVE IMPAIRMENTS: Abnormal gait, decreased mobility, difficulty walking, decreased strength, increased fascial restrictions, increased muscle spasms, impaired sensation, impaired tone, impaired UE functional use, improper body mechanics, postural dysfunction, and pain.   ACTIVITY LIMITATIONS: carrying, lifting, standing, sleeping, bed mobility, locomotion level, and caring for others  PARTICIPATION LIMITATIONS: interpersonal relationship, driving, shopping, community  activity, occupation, and yard work  PERSONAL FACTORS: Age, Fitness, Past/current experiences, Profession, Sex, Time since onset of injury/illness/exacerbation, and 1 comorbidity: CP are also affecting patient's functional outcome.   REHAB POTENTIAL: Fair etiology  CLINICAL DECISION MAKING: Stable/uncomplicated  EVALUATION COMPLEXITY: Low  PLAN:  PT FREQUENCY: 1x/week  PT DURATION: 6 weeks  PLANNED INTERVENTIONS: 97164- PT Re-evaluation, 97750- Physical Performance Testing, 97110-Therapeutic exercises, 97530- Therapeutic activity, [id number]- Neuromuscular re-education, 97535- Self Care, 16109- Manual therapy, [phone]- Gait training, [phone]- Aquatic Therapy, [phone]- Electrical stimulation (manual), Patient/Family education, Balance training, Stair training, Taping, Dry Needling, Joint mobilization, Spinal mobilization, Vestibular training, Visual/preceptual remediation/compensation, DME instructions, Cryotherapy, and Moist heat  PLAN FOR NEXT SESSION: low back stretching, neck stretching, posture retraining, core strengthening    Rebecca Campus, PT Rebecca Campus, PT, DPT, CBIS  12/06/2023, 7:38 AM

## 2023-12-07 ENCOUNTER — Telehealth: Payer: Self-pay | Admitting: *Deleted

## 2023-12-07 NOTE — Telephone Encounter (Signed)
 Faxed request for Surgical Clearance for (BL) Breast Reduction to [employer].  Confirmation received and copy scanned into the chart.//AB/CMA

## 2023-12-11 ENCOUNTER — Other Ambulatory Visit: Payer: Self-pay

## 2023-12-12 ENCOUNTER — Ambulatory Visit: Admitting: Physical Therapy

## 2023-12-12 DIAGNOSIS — R293 Abnormal posture: Secondary | ICD-10-CM

## 2023-12-12 DIAGNOSIS — M6281 Muscle weakness (generalized): Secondary | ICD-10-CM

## 2023-12-12 DIAGNOSIS — R2689 Other abnormalities of gait and mobility: Secondary | ICD-10-CM

## 2023-12-12 NOTE — Therapy (Signed)
 OUTPATIENT PHYSICAL THERAPY NEURO TREATMENT   Patient Name: Elizabeth Stanton MRN: [id number] DOB:1990/08/06, 33 y.o., female Today's Date: 12/12/2023   PCP: Dr. Yvonnie Heritage REFERRING PROVIDER: Gilles Lacks, DO  END OF SESSION:  PT End of Session - 12/12/23 1531     Visit Number 4    Number of Visits 6    Date for PT Re-Evaluation 01/04/24    Authorization Type aetna    PT Start Time 1530    PT Stop Time 1610    PT Time Calculation (min) 40 min    Activity Tolerance Patient tolerated treatment well    Behavior During Therapy [facility] for tasks assessed/performed              Past Medical History:  Diagnosis Date   Allergy Lavender   Anxiety 11/02/2020   Cerebral palsy (HCC)    Depression 11/02/2020   Dysmenorrhea    History of abnormal cervical Pap smear    + HPV   History of cerebral palsy    History of pulmonary embolism    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 [facility])    Past Surgical History:  Procedure Laterality Date   COLPOSCOPY     Patient Active Problem List   Diagnosis Date Noted   Symptomatic mammary hypertrophy 11/05/2023   Personal history of pulmonary embolism 05/17/2023   Severe obstructive sleep apnea-hypopnea syndrome 02/14/2023   Snoring 01/16/2023   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   Cerebral palsy (HCC)    Back pain 10/17/2017   Right hemiplegia (HCC) 10/17/2017   History of cerebral palsy     ONSET DATE: 11/05/23 referral  REFERRING DIAG:  G47.33 (ICD-10-CM) - Severe obstructive sleep apnea-hypopnea syndrome  G80.9 (ICD-10-CM) - Cerebral palsy, unspecified type (HCC)  Z86.711 (ICD-10-CM) - Personal history of pulmonary embolism    THERAPY DIAG:  Abnormal posture  Muscle weakness (generalized)  Other abnormalities of gait and mobility  Rationale for Evaluation and Treatment: Rehabilitation  SUBJECTIVE:                                                                                                                                                                                              SUBJECTIVE STATEMENT: Pt denies any acute changes since last visit. Pt reports her HEP is going well, no questions.  Pt accompanied by: self  PERTINENT HISTORY: CP, anxiety, depression, h/o PE  PAIN:  Are you having pain? Yes: NPRS scale: 6/10 Pain location: Mid and lower back Pain description: burning, aching  Aggravating factors: movement, work, wearing a bra Relieving factors: removing bra (short lived)   PRECAUTIONS: None  PATIENT GOALS: "I'm hoping it will make me feel better"                                                                                                                              TREATMENT   TherEx SciFit multi-peaks level 5 for 8 minutes using BUE/BLEs for neural priming for reciprocal movement, dynamic cardiovascular warmup and increased amplitude of stepping. RPE of 8/10 following activity.   To address pain and muscle tightness in thoracic region: Child's pose with thread the needle 3 x 30 sec each B Sidelying open books 3 x 30 sec each B Added to HEP, see bolded below  To work on core strengthening and stabilization: Supine alt L/R 90/90 toe taps/marches x 8 reps, x 5 reps Onset of R thoracolumbar pain so deferred further repetitions Transitioned to supine LTRs to address flare-up of pain Seated on blue Swiss ball: Alt L/R marches 2 x 10 reps B Alt L/R LAQ 2 x 10 reps B Alt L/R chest press with 2# weighted dowel 2 x 10 reps   PATIENT EDUCATION: Education details: continue HEP and added to HEP Person educated: Patient Education method: Explanation, Demonstration, and Handouts Education comprehension: verbalized understanding, returned demonstration, and needs further education  HOME EXERCISE PROGRAM: Access Code: [email or web address] URL: [email or web address] Date: 11/22/2023 Prepared by: Nickola Baron  Exercises - Shoulder extension with resistance - Neutral   - 1 x daily - 7 x weekly - 3 sets - 10 reps - Scapular Retraction with Resistance  - 1 x daily - 7 x weekly - 3 sets - 10 reps - Cat Cow  - 1 x daily - 7 x weekly - 3 sets - 10 reps - Doorway Pec Stretch at 90 Degrees Abduction  - 1 x daily - 7 x weekly - 3 sets - 30s hold - Prone Scapular Slide with Shoulder Extension  - 1 x daily - 7 x weekly - 3 sets - 10 reps - Prone Shoulder Horizontal Abduction  - 1 x daily - 7 x weekly - 3 sets - 10 reps - Prone Shoulder Flexion  - 1 x daily - 7 x weekly - 3 sets - 10 reps - Seated 3 Way Exercise Ball Roll Out Stretch  - 1 x daily - 7 x weekly - 3 sets - 10 reps - Supine Dead Bug with Leg Extension  - 1 x daily - 7 x weekly - 3 sets - 10 reps - Standing Trunk Rotation with Resistance  - 1 x daily - 7 x weekly - 3 sets - 10 reps - Child's Pose with Thread the Needle  - 1 x daily - 7 x weekly - 1 sets - 3-5 reps - 30 sec hold - Sidelying Open Book Thoracic Lumbar Rotation and Extension  - 1 x daily - 7 x weekly - 1 sets - 3-5 reps - 30 sec hold   GOALS: Goals reviewed with patient? Yes  SHORT  TERM GOALS: Target date: 12/14/23  Pt will be independent with initial HEP for improved functional strength and pain management   Baseline: to be updated Goal status: INITIAL   LONG TERM GOALS: Target date: 01/04/24  Pt will be independent with final HEP for improved functional strength and pain management Baseline: to be updated Goal status: INITIAL  2.  Patient will improve NDI score to </=16 to demonstrate reduction in her neck pain Baseline: 20 Goal status: INITIAL  3.  Patient will improve her Oswestry score to </=10 to demonstrate a reduction in her back pain Baseline: 16 Goal status: INITIAL  4.  Pt will improve 5x STS to </= 15 sec to demo improved functional LE strength and balance   Baseline: 20.72 Goal status: INITIAL  ASSESSMENT:  CLINICAL IMPRESSION: Emphasis of skilled PT session on continuing to work on stretching and strengthening  to address ongoing thoracolumbar pain. Pt does have increase in pain with supine core strengthening exercises but no increase in pain with all other exercises. She continues to benefit from skilled PT services to address core weakness and increase her independence with management of her pain symptoms. Continue POC.     OBJECTIVE IMPAIRMENTS: Abnormal gait, decreased mobility, difficulty walking, decreased strength, increased fascial restrictions, increased muscle spasms, impaired sensation, impaired tone, impaired UE functional use, improper body mechanics, postural dysfunction, and pain.   ACTIVITY LIMITATIONS: carrying, lifting, standing, sleeping, bed mobility, locomotion level, and caring for others  PARTICIPATION LIMITATIONS: interpersonal relationship, driving, shopping, community activity, occupation, and yard work  PERSONAL FACTORS: Age, Fitness, Past/current experiences, Profession, Sex, Time since onset of injury/illness/exacerbation, and 1 comorbidity: CP are also affecting patient's functional outcome.   REHAB POTENTIAL: Fair etiology  CLINICAL DECISION MAKING: Stable/uncomplicated  EVALUATION COMPLEXITY: Low  PLAN:  PT FREQUENCY: 1x/week  PT DURATION: 6 weeks  PLANNED INTERVENTIONS: 97164- PT Re-evaluation, 97750- Physical Performance Testing, 97110-Therapeutic exercises, 97530- Therapeutic activity, [id number]- Neuromuscular re-education, 97535- Self Care, 16109- Manual therapy, [phone]- Gait training, [phone]- Aquatic Therapy, [phone]- Electrical stimulation (manual), Patient/Family education, Balance training, Stair training, Taping, Dry Needling, Joint mobilization, Spinal mobilization, Vestibular training, Visual/preceptual remediation/compensation, DME instructions, Cryotherapy, and Moist heat  PLAN FOR NEXT SESSION: low back stretching, neck stretching, posture retraining, core strengthening    Lorita Rosa, PT Lorita Rosa, PT, DPT, CSRS   12/12/2023, 4:11  PM

## 2023-12-14 NOTE — Telephone Encounter (Signed)
 Copied from CRM [id number]. Topic: Clinical - Medication Question >> Dec 14, 2023  1:48 PM Kita Perish H wrote: Reason for CRM: [facility] is calling to verify medication, looking to see who is controlling her blood thinner medication, what medication she's on and how's she's taking medication. Patient wanting to have procedure done but can't figure out who's prescribing and who's keeping check of blood thinner. Office need to get surgical clearance for blood thinner but not sure who's prescribing. Number they have in system not able to reach patient .  [facility] [phone]  Please follow up

## 2023-12-18 ENCOUNTER — Telehealth: Payer: Self-pay | Admitting: Emergency Medicine

## 2023-12-18 NOTE — Telephone Encounter (Signed)
 LVM for [facility] regarding her questions on who monitors pt Coumadin

## 2023-12-18 NOTE — Telephone Encounter (Unsigned)
 Copied from CRM [id number]. Topic: General - Other >> Dec 18, 2023 12:06 PM Adonis Hoot wrote: Reason for CRM: [facility] called and  would like to know who monitors patient coumadin? They are trying to get her cleared for surgery.  Cb#: [phone]

## 2023-12-19 NOTE — Telephone Encounter (Signed)
 Copied from CRM [id number]. Topic: General - Other >> Dec 18, 2023 12:06 PM Adonis Hoot wrote: Reason for CRM: [facility] called and  would like to know who monitors patient coumadin? They are trying to get her cleared for surgery.  Cb#: [phone] >> Dec 19, 2023  8:21 AM Howard Macho wrote: [facility]ie from [facility] returning Lalita call from the office CB [phone]

## 2023-12-20 ENCOUNTER — Inpatient Hospital Stay: Attending: Nurse Practitioner | Admitting: Nurse Practitioner

## 2023-12-20 ENCOUNTER — Ambulatory Visit: Admitting: Physical Therapy

## 2023-12-20 ENCOUNTER — Inpatient Hospital Stay

## 2023-12-20 VITALS — BP 130/88 | HR 78 | Temp 97.2°F | Resp 17 | Wt 239.8 lb

## 2023-12-20 DIAGNOSIS — Z86711 Personal history of pulmonary embolism: Secondary | ICD-10-CM | POA: Insufficient documentation

## 2023-12-20 NOTE — Telephone Encounter (Signed)
 Spoke with Staff and clarified not sure who is monitor her coumadin. Informed staff pt hasn't had an office visit with Dr. Vedia Geralds since last year.

## 2023-12-20 NOTE — Progress Notes (Addendum)
 [facility]  [phone]   HEMATOLOGY CONSULTATION   Elizabeth Stanton  DOB: 11-12-1990  MR#: [id number]  CSN#: [id number]     Patient Care Team: Yvonnie Heritage, NP as PCP - General Connell Degree Purvis Bruckner, MD as Consulting Physician (Obstetrics and Gynecology)  Reason for consult: reestablish care after having PE 2021.   History of present illness:   33 year old woman with history of pulmonary embolism diagnosed in September 18, 2019.  Her diagnosis was a provoked by immobilization after a fall while on oral contraceptives.  Hypercoagulable work-up was unrevealing. There were no abnormalities to suggest hypercoagulable state. She completed 6 months of therapy on Xarelto  without complication. It was discontinued on 05/14/2020 as she had single, small, provoked PE after period of immobilization while on oral contraceptive. She is seeking surgical clearance to have breast reduction surgery.  Surgical date has not been established. The patient does not have concerns or complaints related to history of PE.  She does have right-sided neck discomfort which radiates to the shoulder blade and down the right arm.  Sometimes she does get pinching sensations shooting down the right arm.  She also has chronic mid and low back pain.  She is currently doing physical therapy to help back and shoulder pain.  She does have limited mobility with her right arm and leg due to birth or prebirth injury.  She was born 3 months premature.  She does have a history of cerebral palsy as a result.  Mobility restrictions on the right side have been present since birth.  She is in the process of trying to lose weight.  She started going to the gym earlier this year.  Difficult to exercise due to shoulder, and back pain.  She does get tired very easily as result of the mobility issues and right-sided hemiplegia. She denies chest pain, chest pressure, or shortness of breath. She denies headaches or visual  disturbances. She denies abdominal pain, nausea, vomiting, or changes in bowel or bladder habits.   Socially, the patient is married.  She lives with her husband.  They do not have children.  She works full-time for [employer].  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  She does not have history of smoking.  She does drink socially, approximately 1 drink per week.  She does not use illegal or illicit drugs.       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)     SURGICAL HISTORY: Past Surgical History:  Procedure Laterality Date   COLPOSCOPY      SOCIAL HISTORY: Social History   Socioeconomic History   Marital status: Married    Spouse name: Not on file   Number of children: Not on file   Years of education: Not on file   Highest education level: Not on file  Occupational History   Not on file  Tobacco Use   Smoking status: Never   Smokeless tobacco: Never  Vaping Use   Vaping status: Never Used  Substance  and Sexual Activity   Alcohol use: Yes    Alcohol/week: 1.0 standard drink of alcohol    Types: 1 Standard drinks or equivalent per week    Comment: wine   Drug use: No   Sexual activity: Yes    Partners: Male    Birth control/protection: I.U.D.    Comment: Was taken off of my birth control due to a blood clot  Other Topics Concern   Not on file  Social History Narrative   Not on file   Social Drivers of Health   Financial Resource Strain: Not on file  Food Insecurity: No Food Insecurity (12/20/2023)   Hunger Vital Sign    Worried About Running Out of Food in the Last Year:  Never true    Ran Out of Food in the Last Year: Never true  Transportation Needs: No Transportation Needs (12/20/2023)   PRAPARE - Administrator, Civil Service (Medical): No    Lack of Transportation (Non-Medical): No  Physical Activity: Not on file  Stress: Not on file  Social Connections: Not on file  Intimate Partner Violence: Not At Risk (12/20/2023)   Humiliation, Afraid, Rape, and Kick questionnaire    Fear of Current or Ex-Partner: No    Emotionally Abused: No    Physically Abused: No    Sexually Abused: No    FAMILY HISTORY: Family History  Problem Relation Age of Onset   Hypertension Mother    Diabetes Mother    Sleep apnea Father    Hypertension Maternal Grandmother    Cancer Paternal Grandfather        skin    ALLERGIES:  is allergic to lavender oil.  MEDICATIONS:  Current Outpatient Medications  Medication Sig Dispense Refill   citalopram  (CELEXA ) 10 MG tablet Take 1 tablet (10 mg total) by mouth daily. Add to the 20 mg tablet for a total dose of 30 mg daily 90 tablet 3   citalopram  (CELEXA ) 20 MG tablet Take one tablet a day. Add to the 10 mg tablet for a total dose of 30 mg a day. 90 tablet 3   levonorgestrel  (MIRENA ) 20 MCG/24HR IUD 1 each by Intrauterine route once.     No current facility-administered medications for this visit.    REVIEW OF SYSTEMS:   Constitutional: Denies fevers, chills or abnormal night sweats. Easily fatigued  Eyes: Denies blurriness of vision, double vision or watery eyes Ears, nose, mouth, throat, and face: Denies mucositis or sore throat Respiratory: Denies cough, dyspnea or wheezes Cardiovascular: Denies palpitation, chest discomfort or lower extremity swelling Gastrointestinal:  Denies nausea, heartburn or change in bowel habits Skin: Denies abnormal skin rashes Lymphatics: Denies new lymphadenopathy or easy bruising Neurological:Denies numbness, tingling or new weaknesses Behavioral/Psych: Mood is stable, no new  changes  Musculoskeletal: right sided weakness for which she has had since birth.  All other systems were reviewed with the patient and are negative.  PHYSICAL EXAMINATION: ECOG PERFORMANCE STATUS: 1 - Symptomatic but completely ambulatory  Vitals:   12/20/23 1416  BP: 130/88  Pulse: 78  Resp: 17  Temp: (!) 97.2 F (36.2 C)  SpO2: 98%   Filed Weights   12/20/23 1416  Weight: 239 lb 12.8 oz (108.8 kg)    GENERAL:alert, no distress and comfortable SKIN: skin color, texture, turgor are normal, no rashes or significant lesions EYES: normal, conjunctiva are pink and non-injected, sclera clear OROPHARYNX:no exudate, no erythema and lips, buccal mucosa, and tongue normal. Severely enlarged tonsils.  NECK:  supple, thyroid  normal size, non-tender, without nodularity LYMPH:  no palpable lymphadenopathy in the cervical, axillary or inguinal LUNGS: clear to auscultation and percussion with normal breathing effort HEART: regular rate & rhythm and no murmurs and no lower extremity edema ABDOMEN:abdomen soft, non-tender and normal bowel sounds Musculoskeletal:no cyanosis of digits and no clubbing.  PSYCH: alert & oriented x 3 with fluent speech NEURO: no focal motor/sensory deficits. Noted right sided weakness of upper and lower extremities.   LABORATORY DATA:  I have reviewed the data as listed Lab Results  Component Value Date   WBC 7.3 12/11/2022   HGB 14.0 12/11/2022   HCT 41.6 12/11/2022   MCV 91.2 12/11/2022   PLT 502.0 (H) 12/11/2022     ASSESSMENT & PLAN:  Personal history of pulmonary embolism Assessment & Plan: history of pulmonary embolism diagnosed in September 18, 2019.  Her diagnosis was a provoked by immobilization after a fall while on oral contraceptives.  Hypercoagulable work-up was unrevealing. There were no abnormalities to suggest hypercoagulable state. She completed 6 months of therapy on Xarelto  without complication. It was discontinued on 05/14/2020 as she had  single, small, provoked PE after period of immobilization while on oral contraceptive. She is seeking surgical clearance to have breast reduction surgery.  Surgical date has not been established. It is believed that patient's single pulmonary embolism was provoked, a result of fall at home and oral contraceptive containing synthetic estrogen. Surgery does put her at increased risk for developing another blood clot. Recommend restarting xarelto  at 10 mg daily, 48 hours after surgery. She should remain on this until she regains physical activity levels close to her baseline. Surgery is expected to be outpatient. It is estimated that mobility will be limited for approximately 2 to 3 weeks. She can discontinue xarelto  again, once she has returned to her baseline level of activity. Preoperative paperwork will be completed and signed per request from her surgeon. Will get any required blood work necessary. Reviewed need to observe for signs of abnormal bleeding or bruising while on blood thinner. She should notify provider of new blood in stool, blood in urine, hematemesis, or epistaxis. She voiced understanding of all instructions. Patient will contact me through MyChart message when surgery date is set and send me any needed paperwork and requested labs.       The patient was seen along with Dr. Maryalice Smaller today. All questions were answered. The patient knows to call the clinic with any problems, questions or concerns. Time spent with the patient was approximately 30 minutes. This time included reviewing progress notes, labs, imaging studies, and discussing plan for follow up.        Sharyon Deis, NP 12/20/2023 4:39 PM  Addendum I have seen the patient, examined her. I agree with the assessment and and plan and have edited the notes.   33 yo female with PMH of PE in 2021, which immobilization after fall and oral contraceptive.  Hypercoagulopathy workup was negative.  She completed 6 months of Xarelto  and is  currently not on any anticoagulation.  She is scheduled to have bilateral breast reduction, which is a moderate risk for thrombosis.  I recommend anticoagulation with prophylactic dose Xarelto  10 mg daily for 2 to 4 weeks after surgery, and stop after heating of surgical incision and back to normal activity.  This will apply for future surgery (exclude low risk surgery such as with send teeth extraction) and injury which resolved with immobilization.  We also discussed strategy to  reduce risk of thrombosis.  All questions were answered.  I will see her as needed in future.  Sonja Gratton MD 12/20/2023

## 2023-12-20 NOTE — Therapy (Incomplete)
 OUTPATIENT PHYSICAL THERAPY NEURO TREATMENT   Patient Name: Elizabeth Stanton MRN: [id number] DOB:[birth year], 33 y.o., female Today's Date: 12/20/2023   PCP: Dr. Yvonnie Heritage REFERRING PROVIDER: Gilles Lacks, DO  END OF SESSION:     Past Medical History:  Diagnosis Date   Allergy Lavender   Anxiety 11/02/2020   Cerebral palsy (HCC)    Depression 11/02/2020   Dysmenorrhea    History of abnormal cervical Pap smear    + HPV   History of cerebral palsy    History of pulmonary embolism    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 [facility])    Past Surgical History:  Procedure Laterality Date   COLPOSCOPY     Patient Active Problem List   Diagnosis Date Noted   Symptomatic mammary hypertrophy 11/05/2023   Personal history of pulmonary embolism 05/17/2023   Severe obstructive sleep apnea-hypopnea syndrome 02/14/2023   Snoring 01/16/2023   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  Rationale for Evaluation and Treatment: Rehabilitation  SUBJECTIVE:                                                                                                                                                                                             SUBJECTIVE STATEMENT: Pt denies any acute changes since last visit. Pt reports her HEP is going well, no questions.***  Pt accompanied by: self  PERTINENT HISTORY: CP, anxiety, depression, h/o PE  PAIN:  Are you having pain? Yes: NPRS scale: 6/10 Pain location: Mid and lower back Pain description: burning, aching  Aggravating factors: movement,  work, wearing a bra Relieving factors: removing bra (short lived)   PRECAUTIONS: None  PATIENT GOALS: "I'm hoping it will make me feel better"  TREATMENT   TherEx SciFit multi-peaks level 5 for 8 minutes using BUE/BLEs for neural priming for reciprocal movement, dynamic cardiovascular warmup and increased amplitude of stepping. RPE of 8/10 following activity.   ***   PATIENT EDUCATION: Education details: continue HEP and added to HEP*** Person educated: Patient Education method: Explanation, Demonstration, and Handouts Education comprehension: verbalized understanding, returned demonstration, and needs further education  HOME EXERCISE PROGRAM: Access Code: [email or web address] URL: [email or web address] Date: 11/22/2023 Prepared by: Nickola Baron  Exercises - Shoulder extension with resistance - Neutral  - 1 x daily - 7 x weekly - 3 sets - 10 reps - Scapular Retraction with Resistance  - 1 x daily - 7 x weekly - 3 sets - 10 reps - Cat Cow  - 1 x daily - 7 x weekly - 3 sets - 10 reps - Doorway Pec Stretch at 90 Degrees Abduction  - 1 x daily - 7 x weekly - 3 sets - 30s hold - Prone Scapular Slide with Shoulder Extension  - 1 x daily - 7 x weekly - 3 sets - 10 reps - Prone Shoulder Horizontal Abduction  - 1 x daily - 7 x weekly - 3 sets - 10 reps - Prone Shoulder Flexion  - 1 x daily - 7 x weekly - 3 sets - 10 reps - Seated 3 Way Exercise Ball Roll Out Stretch  - 1 x daily - 7 x weekly - 3 sets - 10 reps - Supine Dead Bug with Leg Extension  - 1 x daily - 7 x weekly - 3 sets - 10 reps - Standing Trunk Rotation with Resistance  - 1 x daily - 7 x weekly - 3 sets - 10 reps - Child's Pose with Thread the Needle  - 1 x daily - 7 x weekly - 1 sets - 3-5 reps - 30 sec hold - Sidelying Open Book Thoracic Lumbar Rotation and Extension  - 1 x daily - 7 x weekly -  1 sets - 3-5 reps - 30 sec hold   GOALS: Goals reviewed with patient? Yes  SHORT TERM GOALS: Target date: 12/14/23***  Pt will be independent with initial HEP for improved functional strength and pain management   Baseline: to be updated Goal status: INITIAL   LONG TERM GOALS: Target date: 01/04/24  Pt will be independent with final HEP for improved functional strength and pain management Baseline: to be updated Goal status: INITIAL  2.  Patient will improve NDI score to </=16 to demonstrate reduction in her neck pain Baseline: 20 Goal status: INITIAL  3.  Patient will improve her Oswestry score to </=10 to demonstrate a reduction in her back pain Baseline: 16 Goal status: INITIAL  4.  Pt will improve 5x STS to </= 15 sec to demo improved functional LE strength and balance   Baseline: 20.72 Goal status: INITIAL  ASSESSMENT:  CLINICAL IMPRESSION: Emphasis of skilled PT session on*** continuing to work on stretching and strengthening to address ongoing thoracolumbar pain. Pt does have increase in pain with supine core strengthening exercises but no increase in pain with all other exercises. She continues to benefit from skilled PT services to address core weakness and increase her independence with management of her pain symptoms. Continue POC.     OBJECTIVE IMPAIRMENTS: Abnormal gait, decreased mobility, difficulty walking, decreased strength, increased fascial restrictions, increased muscle spasms, impaired sensation, impaired tone, impaired UE functional use, improper body mechanics, postural dysfunction, and pain.   ACTIVITY LIMITATIONS: carrying, lifting, standing,  sleeping, bed mobility, locomotion level, and caring for others  PARTICIPATION LIMITATIONS: interpersonal relationship, driving, shopping, community activity, occupation, and yard work  PERSONAL FACTORS: Age, Fitness, Past/current experiences, Profession, Sex, Time since onset of injury/illness/exacerbation,  and 1 comorbidity: CP are also affecting patient's functional outcome.   REHAB POTENTIAL: Fair etiology  CLINICAL DECISION MAKING: Stable/uncomplicated  EVALUATION COMPLEXITY: Low  PLAN:  PT FREQUENCY: 1x/week  PT DURATION: 6 weeks  PLANNED INTERVENTIONS: 97164- PT Re-evaluation, 97750- Physical Performance Testing, 97110-Therapeutic exercises, 97530- Therapeutic activity, [id number]- Neuromuscular re-education, 97535- Self Care, 16109- Manual therapy, [phone]- Gait training, [phone]- Aquatic Therapy, [phone]- Electrical stimulation (manual), Patient/Family education, Balance training, Stair training, Taping, Dry Needling, Joint mobilization, Spinal mobilization, Vestibular training, Visual/preceptual remediation/compensation, DME instructions, Cryotherapy, and Moist heat  PLAN FOR NEXT SESSION: low back stretching, neck stretching, posture retraining, core strengthening ***   Lorita Rosa, PT Lorita Rosa, PT, DPT, CSRS   12/20/2023, 7:49 AM

## 2023-12-20 NOTE — Assessment & Plan Note (Signed)
 history of pulmonary embolism diagnosed in September 18, 2019.  Her diagnosis was a provoked by immobilization after a fall while on oral contraceptives.  Hypercoagulable work-up was unrevealing. There were no abnormalities to suggest hypercoagulable state. She completed 6 months of therapy on Xarelto  without complication. It was discontinued on 05/14/2020 as she had single, small, provoked PE after period of immobilization while on oral contraceptive. She is seeking surgical clearance to have breast reduction surgery.  Surgical date has not been established. It is believed that patient's single pulmonary embolism was provoked, a result of fall at home and oral contraceptive containing synthetic estrogen. Surgery does put her at increased risk for developing another blood clot. Recommend restarting xarelto  at 10 mg daily, 48 hours after surgery. She should remain on this until she regains physical activity levels close to her baseline. Surgery is expected to be outpatient. It is estimated that mobility will be limited for approximately 2 to 3 weeks. She can discontinue xarelto  again, once she has returned to her baseline level of activity. Preoperative paperwork will be completed and signed per request from her surgeon. Will get any required blood work necessary. Reviewed need to observe for signs of abnormal bleeding or bruising while on blood thinner. She should notify provider of new blood in stool, blood in urine, hematemesis, or epistaxis. She voiced understanding of all instructions. Patient will contact me through MyChart message when surgery date is set and send me any needed paperwork and requested labs.

## 2023-12-26 ENCOUNTER — Ambulatory Visit: Attending: Plastic Surgery

## 2023-12-26 ENCOUNTER — Ambulatory Visit: Admitting: Surgical

## 2023-12-26 VITALS — BP 119/79 | HR 91 | Wt 238.0 lb

## 2023-12-26 DIAGNOSIS — R2689 Other abnormalities of gait and mobility: Secondary | ICD-10-CM | POA: Diagnosis present

## 2023-12-26 DIAGNOSIS — G4733 Obstructive sleep apnea (adult) (pediatric): Secondary | ICD-10-CM

## 2023-12-26 DIAGNOSIS — R2681 Unsteadiness on feet: Secondary | ICD-10-CM | POA: Insufficient documentation

## 2023-12-26 DIAGNOSIS — R293 Abnormal posture: Secondary | ICD-10-CM | POA: Diagnosis present

## 2023-12-26 DIAGNOSIS — M25621 Stiffness of right elbow, not elsewhere classified: Secondary | ICD-10-CM | POA: Diagnosis present

## 2023-12-26 DIAGNOSIS — M542 Cervicalgia: Secondary | ICD-10-CM

## 2023-12-26 DIAGNOSIS — J351 Hypertrophy of tonsils: Secondary | ICD-10-CM

## 2023-12-26 DIAGNOSIS — M6281 Muscle weakness (generalized): Secondary | ICD-10-CM | POA: Insufficient documentation

## 2023-12-26 DIAGNOSIS — G8113 Spastic hemiplegia affecting right nondominant side: Secondary | ICD-10-CM | POA: Diagnosis present

## 2023-12-26 DIAGNOSIS — G8929 Other chronic pain: Secondary | ICD-10-CM | POA: Diagnosis present

## 2023-12-26 NOTE — Therapy (Signed)
 OUTPATIENT PHYSICAL THERAPY NEURO TREATMENT   Patient Name: Elizabeth Stanton MRN: [id number] DOB:10-Oct-1990, 33 y.o., female Today's Date: 12/26/2023   PCP: Dr. Yvonnie Heritage REFERRING PROVIDER: Gilles Lacks, DO  END OF SESSION:  PT End of Session - 12/26/23 1145     Visit Number 5    Number of Visits 6    Date for PT Re-Evaluation 01/04/24    Authorization Type aetna    PT Start Time 1143    PT Stop Time 1232    PT Time Calculation (min) 49 min    Activity Tolerance Patient tolerated treatment well    Behavior During Therapy [facility] for tasks assessed/performed              Past Medical History:  Diagnosis Date   Allergy Lavender   Anxiety 11/02/2020   Cerebral palsy (HCC)    Depression 11/02/2020   Dysmenorrhea    History of abnormal cervical Pap smear    + HPV   History of cerebral palsy    History of pulmonary embolism    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 [facility])    Past Surgical History:  Procedure Laterality Date   COLPOSCOPY     Patient Active Problem List   Diagnosis Date Noted   Symptomatic mammary hypertrophy 11/05/2023   Personal history of pulmonary embolism 05/17/2023   Severe obstructive sleep apnea-hypopnea syndrome 02/14/2023   Snoring 01/16/2023   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   Cerebral palsy (HCC)    Back pain 10/17/2017   Right hemiplegia (HCC) 10/17/2017   History of cerebral palsy     ONSET DATE: 11/05/23 referral  REFERRING DIAG:  G47.33 (ICD-10-CM) - Severe obstructive sleep apnea-hypopnea syndrome  G80.9 (ICD-10-CM) - Cerebral palsy, unspecified type (HCC)  Z86.711 (ICD-10-CM) - Personal history of pulmonary embolism    THERAPY DIAG:  Abnormal posture  Muscle weakness (generalized)  Other abnormalities of gait and mobility  Rationale for Evaluation and Treatment: Rehabilitation  SUBJECTIVE:                                                                                                                                                                                              SUBJECTIVE STATEMENT: Patient reports doing well. Did have to miss last session due to running late at her drs appt. Denies falls. F/u with plastics this afternoon.   Pt accompanied by: self  PERTINENT HISTORY: CP, anxiety, depression, h/o PE  PAIN:  Are you having pain? Yes: NPRS scale: 6/10 Pain location: Mid and lower back Pain description: burning, aching  Aggravating factors: movement, work, wearing a bra Relieving factors:  removing bra (short lived)   PRECAUTIONS: None  PATIENT GOALS: "I'm hoping it will make me feel better"                                                                                                                              TREATMENT   TherEx -scifit hills level 3 x5 mins B UE/LE -mid thoracic stretch over block against wall  -sidelying open books  -bird dog   Self care/home management:  -time spent discussing PT POC in terms of progress and surgery approval   PATIENT EDUCATION: Education details: continue HEP and added to HEP Person educated: Patient Education method: Explanation, Demonstration, and Handouts Education comprehension: verbalized understanding, returned demonstration, and needs further education  HOME EXERCISE PROGRAM: Access Code: [email or web address] URL: [email or web address] Date: 11/22/2023 Prepared by: Nickola Baron  Exercises - Shoulder extension with resistance - Neutral  - 1 x daily - 7 x weekly - 3 sets - 10 reps - Scapular Retraction with Resistance  - 1 x daily - 7 x weekly - 3 sets - 10 reps - Cat Cow  - 1 x daily - 7 x weekly - 3 sets - 10 reps - Doorway Pec Stretch at 90 Degrees Abduction  - 1 x daily - 7 x weekly - 3 sets - 30s hold - Prone Scapular Slide with Shoulder Extension  - 1 x daily - 7 x weekly - 3 sets - 10 reps - Prone Shoulder Horizontal Abduction  - 1 x daily - 7 x weekly - 3 sets - 10 reps - Prone Shoulder Flexion  - 1  x daily - 7 x weekly - 3 sets - 10 reps - Seated 3 Way Exercise Ball Roll Out Stretch  - 1 x daily - 7 x weekly - 3 sets - 10 reps - Supine Dead Bug with Leg Extension  - 1 x daily - 7 x weekly - 3 sets - 10 reps - Standing Trunk Rotation with Resistance  - 1 x daily - 7 x weekly - 3 sets - 10 reps - Child's Pose with Thread the Needle  - 1 x daily - 7 x weekly - 1 sets - 3-5 reps - 30 sec hold - Sidelying Open Book Thoracic Lumbar Rotation and Extension  - 1 x daily - 7 x weekly - 1 sets - 3-5 reps - 30 sec hold   GOALS: Goals reviewed with patient? Yes  SHORT TERM GOALS: Target date: 12/14/23  Pt will be independent with initial HEP for improved functional strength and pain management   Baseline: to be updated; provided Goal status: MET   LONG TERM GOALS: Target date: 01/04/24  Pt will be independent with final HEP for improved functional strength and pain management Baseline: to be updated Goal status: INITIAL  2.  Patient will improve NDI score to </=16 to demonstrate reduction in her neck pain Baseline: 20 Goal status: INITIAL  3.  Patient will improve  her Oswestry score to </=10 to demonstrate a reduction in her back pain Baseline: 16 Goal status: INITIAL  4.  Pt will improve 5x STS to </= 15 sec to demo improved functional LE strength and balance   Baseline: 20.72 Goal status: INITIAL  ASSESSMENT:  CLINICAL IMPRESSION: Patient seen for skilled PT session with emphasis on continuing core and posture retraining. She continues to be most limited by R hemiplegia with spasticity and hypertonicity, which inherently impacts her posture and thus her experience of back pain. Discussed PT POC in terms of making up missed PT visit last week, should it be needed. Continue POC.      OBJECTIVE IMPAIRMENTS: Abnormal gait, decreased mobility, difficulty walking, decreased strength, increased fascial restrictions, increased muscle spasms, impaired sensation, impaired tone, impaired UE  functional use, improper body mechanics, postural dysfunction, and pain.   ACTIVITY LIMITATIONS: carrying, lifting, standing, sleeping, bed mobility, locomotion level, and caring for others  PARTICIPATION LIMITATIONS: interpersonal relationship, driving, shopping, community activity, occupation, and yard work  PERSONAL FACTORS: Age, Fitness, Past/current experiences, Profession, Sex, Time since onset of injury/illness/exacerbation, and 1 comorbidity: CP are also affecting patient's functional outcome.   REHAB POTENTIAL: Fair etiology  CLINICAL DECISION MAKING: Stable/uncomplicated  EVALUATION COMPLEXITY: Low  PLAN:  PT FREQUENCY: 1x/week  PT DURATION: 6 weeks  PLANNED INTERVENTIONS: 97164- PT Re-evaluation, 97750- Physical Performance Testing, 97110-Therapeutic exercises, 97530- Therapeutic activity, [id number]- Neuromuscular re-education, 97535- Self Care, [id number]- Manual therapy, [phone]- Gait training, [phone]- Aquatic Therapy, [phone]- Electrical stimulation (manual), Patient/Family education, Balance training, Stair training, Taping, Dry Needling, Joint mobilization, Spinal mobilization, Vestibular training, Visual/preceptual remediation/compensation, DME instructions, Cryotherapy, and Moist heat  PLAN FOR NEXT SESSION: low back stretching, neck stretching, posture retraining, core strengthening    Rebecca Campus, PT Rebecca Campus, PT, DPT, CBIS  12/26/2023, 12:48 PM

## 2023-12-26 NOTE — Progress Notes (Signed)
   Referring Provider Elvira Hammersmith, MD [address] [place],  [place] [address]   CC:  Chief Complaint  Patient presents with   Follow-up      Elizabeth Stanton is an 33 y.o. female.  HPI: Patient is a 33 y.o. year old female here for follow up after completing physical therapy for pain related to macromastia. She reports that she has completed 5 physical therapy visits.  She was scheduled for 6, but had to miss 1 due to overlap and appointments.  She reports that she has tolerated physical therapy well, however unfortunately continues to have mid back pain along her bra strap, neck and shoulder pain.  She also has central upper breast pain that she describes as a pulling sensation.  She is still interested in pursuing surgical intervention.  She does have a history of pulmonary embolism, reports she had an injury and after a fall and was sedentary for a few weeks.  She had a very large PE which was managed with warfarin per patient.  She reports that she has not had any issues since.  She does have history of OSA, reports a history of enlarged tonsils.  She is in the process of establishing care for referral to ENT.  She reports that she has not heard from ENT yet  Review of Systems General: Positive neck and back pain  Physical Exam    12/26/2023    2:19 PM 12/20/2023    2:16 PM 11/05/2023    8:32 AM  Vitals with BMI  Height   5\' 4"   Weight 238 lbs 239 lbs 13 oz 241 lbs 13 oz  BMI   41.48  Systolic 119 130 604  Diastolic 79 88 85  Pulse 91 78 82    General:  No acute distress,  Alert and oriented, Non-Toxic, Normal speech and affect Psych: Normal behavior and mood HEENT: Significantly enlarged tonsils noted  Assessment/Plan Patient is a very pleasant 33 year old female.  She has completed 5 visits of physical therapy, she is scheduled to complete her 6 visit next week.  We will plan to have a telephone visit next week to check-in once more after completing PT.   Unfortunately at this point she is continuing to have a lot of upper back mid back neck/shoulder pain still.  Patient is interested in pursuing surgical intervention for bilateral breast reduction.   Will send patient referral to ENT for enlarged tonsils.  Elizabeth Stanton Elizabeth Stanton 12/26/2023, 2:23 PM

## 2023-12-31 ENCOUNTER — Ambulatory Visit: Payer: Self-pay | Admitting: Physical Therapy

## 2023-12-31 ENCOUNTER — Other Ambulatory Visit: Payer: Self-pay | Admitting: Student

## 2023-12-31 DIAGNOSIS — R293 Abnormal posture: Secondary | ICD-10-CM | POA: Diagnosis not present

## 2023-12-31 DIAGNOSIS — M6281 Muscle weakness (generalized): Secondary | ICD-10-CM

## 2023-12-31 DIAGNOSIS — N62 Hypertrophy of breast: Secondary | ICD-10-CM

## 2023-12-31 DIAGNOSIS — R2689 Other abnormalities of gait and mobility: Secondary | ICD-10-CM

## 2023-12-31 NOTE — Telephone Encounter (Signed)
 Referral to OT placed for this patient

## 2023-12-31 NOTE — Progress Notes (Signed)
 OT orders.   Per Elizabeth Stanton, PT, Elizabeth Stanton  was evaluated by PT on 11/22/23.  The patient would benefit from OT evaluation for R UE coordination.

## 2023-12-31 NOTE — Therapy (Signed)
 OUTPATIENT PHYSICAL THERAPY NEURO TREATMENT - DISCHARGE NOTE   Patient Name: Elizabeth Stanton MRN: [id number] DOB:Apr 28, 1991, 33 y.o., female Today's Date: 12/31/2023   PCP: Dr. Yvonnie Heritage REFERRING PROVIDER: Gilles Lacks, DO   PHYSICAL THERAPY DISCHARGE SUMMARY  Visits from Start of Care: 6  Current functional level related to goals / functional outcomes: Mod I   Remaining deficits: Ongoing neck and back pain   Education / Equipment: Handout for final HEP   Patient agrees to discharge. Patient goals were partially met. Patient is being discharged due to lack of progress.   END OF SESSION:  PT End of Session - 12/31/23 1057     Visit Number 6    Number of Visits 6    Date for PT Re-Evaluation 01/04/24    Authorization Type aetna    PT Start Time 1055    PT Stop Time 1113   d/c   PT Time Calculation (min) 18 min    Activity Tolerance Patient tolerated treatment well    Behavior During Therapy [facility] for tasks assessed/performed               Past Medical History:  Diagnosis Date   Allergy Lavender   Anxiety 11/02/2020   Cerebral palsy (HCC)    Depression 11/02/2020   Dysmenorrhea    History of abnormal cervical Pap smear    + HPV   History of cerebral palsy    History of pulmonary embolism    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 [facility])    Past Surgical History:  Procedure Laterality Date   COLPOSCOPY     Patient Active Problem List   Diagnosis Date Noted   Symptomatic mammary hypertrophy 11/05/2023   Personal history of pulmonary embolism 05/17/2023   Severe obstructive sleep apnea-hypopnea syndrome 02/14/2023   Snoring 01/16/2023   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   Cerebral palsy (HCC)    Back pain 10/17/2017   Right hemiplegia (HCC) 10/17/2017   History of cerebral palsy     ONSET DATE: 11/05/23 referral  REFERRING DIAG:  G47.33 (ICD-10-CM) - Severe obstructive sleep apnea-hypopnea syndrome  G80.9 (ICD-10-CM) - Cerebral  palsy, unspecified type (HCC)  Z86.711 (ICD-10-CM) - Personal history of pulmonary embolism    THERAPY DIAG:  Abnormal posture  Muscle weakness (generalized)  Other abnormalities of gait and mobility  Rationale for Evaluation and Treatment: Rehabilitation  SUBJECTIVE:                                                                                                                                                                                             SUBJECTIVE STATEMENT:  Pt will have another consult with plastics this week. Pt has multiple referrals for ENT, going to get tonsils addressed before having her reduction surgery.  Pt reports she is having a rough day with regards to pain today. She did try a new bra and it is not helping.  Pt accompanied by: self  PERTINENT HISTORY: CP, anxiety, depression, h/o PE  PAIN:  Are you having pain? Yes: NPRS scale: 7/10 Pain location: Mid and lower back Pain description: burning, aching  Aggravating factors: movement, work, wearing a bra Relieving factors: removing bra (short lived)   PRECAUTIONS: None  PATIENT GOALS: "I'm hoping it will make me feel better"                                                                                                                              TREATMENT   TherAct For LTG assessment: NDI: 21/50 ODI: 22/50  5xSTS (no UE): 28 sec 5xSTS (BUE): 28 sec  PATIENT EDUCATION: Education details: continue HEP, results of OM and functional implications Person educated: Patient Education method: Explanation Education comprehension: verbalized understanding  HOME EXERCISE PROGRAM: Access Code: [email or web address] URL: [email or web address] Date: 11/22/2023 Prepared by: Nickola Baron  Exercises - Shoulder extension with resistance - Neutral  - 1 x daily - 7 x weekly - 3 sets - 10 reps - Scapular Retraction with Resistance  - 1 x daily - 7 x weekly - 3 sets - 10 reps - Cat Cow  - 1 x daily  - 7 x weekly - 3 sets - 10 reps - Doorway Pec Stretch at 90 Degrees Abduction  - 1 x daily - 7 x weekly - 3 sets - 30s hold - Prone Scapular Slide with Shoulder Extension  - 1 x daily - 7 x weekly - 3 sets - 10 reps - Prone Shoulder Horizontal Abduction  - 1 x daily - 7 x weekly - 3 sets - 10 reps - Prone Shoulder Flexion  - 1 x daily - 7 x weekly - 3 sets - 10 reps - Seated 3 Way Exercise Ball Roll Out Stretch  - 1 x daily - 7 x weekly - 3 sets - 10 reps - Supine Dead Bug with Leg Extension  - 1 x daily - 7 x weekly - 3 sets - 10 reps - Standing Trunk Rotation with Resistance  - 1 x daily - 7 x weekly - 3 sets - 10 reps - Child's Pose with Thread the Needle  - 1 x daily - 7 x weekly - 1 sets - 3-5 reps - 30 sec hold - Sidelying Open Book Thoracic Lumbar Rotation and Extension  - 1 x daily - 7 x weekly - 1 sets - 3-5 reps - 30 sec hold   GOALS: Goals reviewed with patient? Yes  SHORT TERM GOALS: Target date: 12/14/23  Pt will be independent with initial HEP for improved functional strength and pain management   Baseline: to be  updated; provided Goal status: MET   LONG TERM GOALS: Target date: 01/04/24  Pt will be independent with final HEP for improved functional strength and pain management Baseline: to be updated Goal status: MET  2.  Patient will improve NDI score to </=16 to demonstrate reduction in her neck pain Baseline: 20, 21 (6/9) Goal status: NOT MET  3.  Patient will improve her Oswestry score to </=10 to demonstrate a reduction in her back pain Baseline: 16, 22 (6/9) Goal status: NOT MET  4.  Pt will improve 5x STS to </= 15 sec to demo improved functional LE strength and balance   Baseline: 20.72, 28 sec no UE (6/9) Goal status: NOT MET  ASSESSMENT:  CLINICAL IMPRESSION: Emphasis of skilled PT session on assessing LTG in preparation for d/c from OPPT services this date. Pt has met 1/4 LTG due to being independent with her final HEP with short-term relief of her  symptoms after performing exercises. She had worse scores on the NDI and ODI due to have increased pain this date, her pain tends to fluctuate. Additionally, it took her increased time to complete the 5xSTS this session. She has shown minimal improvement in her symptoms with physical therapy and can benefit from further medication intervention for symptom management.       OBJECTIVE IMPAIRMENTS: Abnormal gait, decreased mobility, difficulty walking, decreased strength, increased fascial restrictions, increased muscle spasms, impaired sensation, impaired tone, impaired UE functional use, improper body mechanics, postural dysfunction, and pain.   ACTIVITY LIMITATIONS: carrying, lifting, standing, sleeping, bed mobility, locomotion level, and caring for others  PARTICIPATION LIMITATIONS: interpersonal relationship, driving, shopping, community activity, occupation, and yard work  PERSONAL FACTORS: Age, Fitness, Past/current experiences, Profession, Sex, Time since onset of injury/illness/exacerbation, and 1 comorbidity: CP are also affecting patient's functional outcome.   REHAB POTENTIAL: Fair etiology  CLINICAL DECISION MAKING: Stable/uncomplicated  EVALUATION COMPLEXITY: Low  PLAN:  discharge from PT   Lorita Rosa, PT Lorita Rosa, PT, DPT, CSRS   12/31/2023, 11:13 AM

## 2024-01-01 NOTE — Therapy (Signed)
 OUTPATIENT OCCUPATIONAL THERAPY NEURO EVALUATION/TREATMENT  Patient Name: Elizabeth Stanton MRN: [id number] DOB:01-17-1991, 33 y.o., female Today's Date: 01/02/2024  PCP: Yvonnie Heritage, NP REFERRING PROVIDER: Harden Leyden, PA-C  END OF SESSION:  OT End of Session - 01/02/24 1238     Visit Number 1    Number of Visits 8    Date for OT Re-Evaluation 02/15/24    Authorization Type [employer]et[employer] - no auth required    OT Start Time 1107    OT Stop Time 1155    OT Time Calculation (min) 48 min    Activity Tolerance Patient tolerated treatment well    Behavior During Therapy [facility] for tasks assessed/performed             Past Medical History:  Diagnosis Date   Allergy Lavender   Anxiety 11/02/2020   Cerebral palsy (HCC)    Depression 11/02/2020   Dysmenorrhea    History of abnormal cervical Pap smear    + HPV   History of cerebral palsy    History of pulmonary embolism    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)    Past Surgical History:  Procedure Laterality Date   COLPOSCOPY     Patient Active Problem List   Diagnosis Date Noted   Symptomatic mammary hypertrophy 11/05/2023   Personal history of pulmonary embolism 05/17/2023   Severe obstructive sleep apnea-hypopnea syndrome 02/14/2023   Snoring 01/16/2023   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   Cerebral palsy (HCC)    Back pain 10/17/2017   Right hemiplegia (HCC) 10/17/2017   History of cerebral palsy     ONSET DATE: 12/31/2023 (referral date)   REFERRING DIAG: N62 (ICD-10-CM) - Symptomatic mammary hypertrophy  THERAPY DIAG:  Spastic hemiplegia affecting right nondominant side, unspecified etiology (HCC)  Stiffness of right elbow, not elsewhere classified  Other chronic pain  Unsteadiness on feet  Rationale for Evaluation and Treatment: Rehabilitation  SUBJECTIVE:   SUBJECTIVE STATEMENT: Chronic pain both hips and knees (Rt side worse) and Rt elbow, upper traps, Rt side of spine and lower back  5-7/10 My arm from elbow down gets cold and fingernails turn blue b/c I forget to move it.  Pt accompanied by: self  PERTINENT HISTORY: baseline hemiplegia d/t CP, CP, anxiety, depression, h/o PE, Mammary hypertrophy  PRECAUTIONS: Fall  WEIGHT BEARING RESTRICTIONS: No  PAIN:  Are you having pain? Chronic (see subjective)   FALLS: Has patient fallen in last 6 months? No  LIVING ENVIRONMENT: Lives with: lives with their spouse Lives in: first floor apt Has following equipment at home: None  PLOF: Independent with basic ADLs, Vocation/Vocational requirements: office work, and Leisure: reading *Uses RUE as stabilizer to min assist  PATIENT GOALS: to get brace/splint for day and night  OBJECTIVE:  Note: Objective measures were completed at Evaluation unless otherwise noted.  HAND DOMINANCE: Left  ADLs: Overall ADLs: mod I to occasional min assist Transfers/ambulation related to ADLs: independent Eating: husband cuts food Grooming: difficulty styling hair UB Dressing: bra difficult and gets husband to hook at times, wearing sports bras now LB Dressing: difficulty pulling up pants/underwear on Rt side, has husband tie shoes or wears slip on shoes Toileting: wiping difficult, difficulty pulling up clothes on Rt side Bathing: mod I with grab bars, LH sponge Tub Shower transfers: mod I w/ use of grab bars Equipment: Grab bars - would benefit from shower chair or bench for balance  IADLs: Shopping: pt/husband do together Light housekeeping: husband doing the last  5 years Meal Prep: Pt does minimal cooking, husband does most cooking Community mobility: pt drives Medication management: independent Landscape architect: independent Handwriting: Lt handed - no issues  MOBILITY STATUS: Independent   UPPER EXTREMITY ROM:  LUE AROM WNL's. RUE AROM WFL's at shoulder. Elbow ext approx -45*, wrist ext to neutral but compensating in fingers w/ hyperextension in PIP joints and  sometimes in MP joints. Pt with difficulty making fist d/t spasticity.  However, passively can get pt easily into resting hand position  HAND FUNCTION: Pt uses RUE more proximally as stabilizer to min assist but does not use Rt hand  COORDINATION: Doe not use Rt hand for function  SENSATION: Diminished, tingling and sometimes cold Rt hand  EDEMA: mild dorsal Rt wrist  MUSCLE TONE: RUE: Hypertonic and dystonia   COGNITION: Overall cognitive status: Within functional limits for tasks assessed  VISION: Subjective report: vision ok right now Baseline vision: Wears glasses all the time Visual history: mild scotomas  PERCEPTION: Not tested  PRAXIS: Not tested  OBSERVATIONS: very pleasant, good insight, Rt spastic hemiplegia from CP (elbow distally worse)                                                                                                                              TREATMENT DATE: 01/02/24    Pt shown tub bench vs. Shower chair to increase safety with bathing, and pro's/con's of both. Pt provided handouts of both however feel pt may do best with shower chair as she has 3 grab bars in shower and tub is oval shaped.   Discussed splinting options - pt shown pre-fab wrist brace to support wrist in neutral to slight extension during the day (handout provided to order online). Also discussed custom thermoplastic resting hand splint to support wrist, thumb, and fingers at night (will make next session). Elbow orthotic options also to be considered.   Discussed goals and potential A/E that may increase ease/independence and safety with ADLS. Recommended loofah or bath mitt for bathing Lt arm with Rt hand. Pt also has LH sponge for getting back and lower legs.   Also discussed benefits of botox for RUE and recommended discussing further with neurologist. Pt reports she has had botox in the past and it helped but has not had recently.  Recommended botox to elbow and wrist  flexors   PATIENT EDUCATION: Education details: see above Person educated: Patient Education method: Explanation, Verbal cues, and Handouts Education comprehension: verbalized understanding  HOME EXERCISE PROGRAM: N/A   GOALS: Goals reviewed with patient? Yes  SHORT TERM GOALS: Target date: 01/25/24  Independent with splint wear and care for Rt hand/wrist (daytime and pm wear)  Baseline: Goal status: INITIAL  2.  Pt to verbalize understanding of A/E and DME needs to increase ease/independence and safety with ADLS (rocker knife, one handed cutting board, shoe lace options, one handed can and jar openers, shower chair, adapted hair clips, and bra angel)  Baseline:  Goal status: INITIAL    LONG TERM GOALS: Target date: 02/15/24  Independent with HEP for RUE Baseline:  Goal status: INITIAL  2.  Pt to obtain elbow orthotic for spasticity management and to hopefully increase elbow extension RUE Baseline:  Goal status: INITIAL   ASSESSMENT:  CLINICAL IMPRESSION: Patient is a 33 y.o. female who was seen today for occupational therapy evaluation for spastic hemiplegia RUE d/t cerebral palsy. Hx includes CP, symptomatic mammary hypertrophy, PE . Patient currently presents near baseline level of functioning demonstrating functional deficits and impairments as noted below. Pt would benefit from skilled OT services in the outpatient setting to work on splinting needs, A/E and DME needs to increase safety, ease, and independence with ADLS  PERFORMANCE DEFICITS: in functional skills including ADLs, IADLs, coordination, sensation, edema, tone, ROM, mobility, balance, body mechanics, decreased knowledge of use of DME, and UE functional use.   IMPAIRMENTS: are limiting patient from ADLs and IADLs.   CO-MORBIDITIES: may have co-morbidities  that affects occupational performance. Patient will benefit from skilled OT to address above impairments and improve overall function.  MODIFICATION  OR ASSISTANCE TO COMPLETE EVALUATION: No modification of tasks or assist necessary to complete an evaluation.  OT OCCUPATIONAL PROFILE AND HISTORY: Problem focused assessment: Including review of records relating to presenting problem.  CLINICAL DECISION MAKING: LOW - limited treatment options, no task modification necessary  REHAB POTENTIAL: Good  EVALUATION COMPLEXITY: Low    PLAN:  OT FREQUENCY: 1-2x/week  OT DURATION: 6 weeks  PLANNED INTERVENTIONS: 97535 self care/ADL training, 40981 therapeutic exercise, 97530 therapeutic activity, 97112 neuromuscular re-education, 97140 manual therapy, 97760 Orthotic Initial, [id number] Prosthetic Initial, 97763 Orthotic/Prosthetic subsequent, passive range of motion, functional mobility training, patient/family education, and DME and/or AE instructions  RECOMMENDED OTHER SERVICES: Recommended pt discuss with neurologist possible botox for RUE elbow and wrist flexors  CONSULTED AND AGREED WITH PLAN OF CARE: Patient  PLAN FOR NEXT SESSION: fabricate resting hand splint (natural curve to fingers to prevent PIP hyperextension)   Velinda Getting, OT 01/02/2024, 12:39 PM

## 2024-01-01 NOTE — Telephone Encounter (Signed)
 Thank you :)

## 2024-01-02 ENCOUNTER — Encounter: Payer: Self-pay | Admitting: Neurology

## 2024-01-02 ENCOUNTER — Ambulatory Visit: Admitting: Surgical

## 2024-01-02 ENCOUNTER — Ambulatory Visit: Admitting: Occupational Therapy

## 2024-01-02 DIAGNOSIS — G8929 Other chronic pain: Secondary | ICD-10-CM

## 2024-01-02 DIAGNOSIS — N62 Hypertrophy of breast: Secondary | ICD-10-CM

## 2024-01-02 DIAGNOSIS — J351 Hypertrophy of tonsils: Secondary | ICD-10-CM

## 2024-01-02 DIAGNOSIS — G8113 Spastic hemiplegia affecting right nondominant side: Secondary | ICD-10-CM

## 2024-01-02 DIAGNOSIS — G4733 Obstructive sleep apnea (adult) (pediatric): Secondary | ICD-10-CM

## 2024-01-02 DIAGNOSIS — M25621 Stiffness of right elbow, not elsewhere classified: Secondary | ICD-10-CM

## 2024-01-02 DIAGNOSIS — R293 Abnormal posture: Secondary | ICD-10-CM | POA: Diagnosis not present

## 2024-01-02 DIAGNOSIS — R2681 Unsteadiness on feet: Secondary | ICD-10-CM

## 2024-01-02 NOTE — Progress Notes (Signed)
   Referring Provider Yvonnie Heritage, NP [address] [place],  [place] [address]   CC: No chief complaint on file.     Elizabeth Stanton is an 33 y.o. female.  HPI: Patient is a very pleasant 33 year old female who presents via telephone to discuss physical therapy.  She completed PT, she has now completed 6 visits over 6-week.,  She reports that she had an initial 5 visits, and then had 1 additional visit on 12/31/2023 and had reevaluation at that time.  She reports that she actually noted increased pain scores from her previous visits.  She reports she is still having ongoing pain in her back, neck and shoulder.  She is interested in pursuing surgical intervention for bilateral breast reduction.  She is still waiting on hearing from ENT in regards to her enlarged tonsils.  She has a history of OSA which is attributed to the enlarged tonsils.  The patient gave consent to have this visit done by telemedicine / virtual visit, two identifiers were used to identify patient. This is also consent for access the chart and treat the patient via this visit. The patient is located in [place] .  I, the provider, am at the office.  We spent 5 minutes together for the visit.  Joined by telephone.   Review of Systems General: Positive back and neck pain  Physical Exam    12/26/2023    2:19 PM 12/20/2023    2:16 PM 11/05/2023    8:32 AM  Vitals with BMI  Height   5' 4  Weight 238 lbs 239 lbs 13 oz 241 lbs 13 oz  BMI   41.48  Systolic 119 130 010  Diastolic 79 88 85  Pulse 91 78 82    Normal behavior and mood noted.  Assessment/Plan Patient is a very pleasant 32 year old female interested in breast reduction surgery.  She is going to undergo ENT workup prior to breast reduction surgery as she has severely enlarged tonsils causing her to have OSA.  She is waiting to hear back from ENT, she will notify us  when she has date scheduled for ENT procedures and we can discuss surgical planning  for breast reduction.  We discussed about 6 to 8 weeks between procedures is typically recommended, however will obtain further information from ENT on their recommendations after tonsillectomy.  She is aware to call once she is finished with ENT workup.  She has completed 6 weeks of physical therapy related some back, neck and shoulder pain due to macromastia.  She is a good candidate for bilateral breast reduction.  Elizabeth Stanton Elizabeth Stanton 01/02/2024, 2:48 PM

## 2024-01-07 ENCOUNTER — Telehealth: Payer: Self-pay | Admitting: Neurology

## 2024-01-07 MED ORDER — INCOBOTULINUMTOXINA 100 UNITS IM SOLR
400.0000 [IU] | Freq: Once | INTRAMUSCULAR | 0 refills | Status: AC
  Filled 2024-01-21: qty 4, 84d supply, fill #0

## 2024-01-07 MED ORDER — INCOBOTULINUMTOXINA 100 UNITS IM SOLR: 100.0000 [IU] | Freq: Once | INTRAMUSCULAR | 0 refills | Status: DC

## 2024-01-07 NOTE — Addendum Note (Signed)
 Addended by: Mollie Anger E on: 01/07/2024 01:03 PM   Modules accepted: Orders

## 2024-01-07 NOTE — Telephone Encounter (Signed)
 Sent as requested.

## 2024-01-07 NOTE — Telephone Encounter (Signed)
 Resent

## 2024-01-07 NOTE — Telephone Encounter (Signed)
 Completed PA via CMM per request from Dr. Gracie Lav, status is pending. Key: [id number]  G80.9 Xeomin 400 units

## 2024-01-07 NOTE — Telephone Encounter (Addendum)
 Received fax of approval, please send rx for Xeomin 100 units quantity of 4 to [facility].  Auth#: [id number] (01/07/24-01/06/25)

## 2024-01-07 NOTE — Telephone Encounter (Signed)
 This was sent as 100 units. Please resend as 100 units with a quantity of 4, 400 units total.

## 2024-01-07 NOTE — Addendum Note (Signed)
 Addended by: Mollie Anger E on: 01/07/2024 12:46 PM   Modules accepted: Orders

## 2024-01-09 ENCOUNTER — Encounter: Payer: Self-pay | Admitting: Occupational Therapy

## 2024-01-09 ENCOUNTER — Ambulatory Visit: Admitting: Occupational Therapy

## 2024-01-09 DIAGNOSIS — R293 Abnormal posture: Secondary | ICD-10-CM | POA: Diagnosis not present

## 2024-01-09 DIAGNOSIS — R2681 Unsteadiness on feet: Secondary | ICD-10-CM

## 2024-01-09 DIAGNOSIS — M6281 Muscle weakness (generalized): Secondary | ICD-10-CM

## 2024-01-09 DIAGNOSIS — G8113 Spastic hemiplegia affecting right nondominant side: Secondary | ICD-10-CM

## 2024-01-09 DIAGNOSIS — M25621 Stiffness of right elbow, not elsewhere classified: Secondary | ICD-10-CM

## 2024-01-09 DIAGNOSIS — G8929 Other chronic pain: Secondary | ICD-10-CM

## 2024-01-09 NOTE — Therapy (Signed)
 OUTPATIENT OCCUPATIONAL THERAPY NEURO TREATMENT  Patient Name: Elizabeth Stanton MRN: [id number] DOB:Jun 25, 1991, 33 y.o., female Today's Date: 01/09/2024  PCP: Yvonnie Heritage, NP REFERRING PROVIDER: Harden Leyden, PA-C  END OF SESSION:  OT End of Session - 01/09/24 1411     Visit Number 2    Number of Visits 8    Date for OT Re-Evaluation 02/15/24    Authorization Type Aetna - no auth required    OT Start Time 1410    OT Stop Time 1455    OT Time Calculation (min) 45 min    Activity Tolerance Patient tolerated treatment well    Behavior During Therapy [facility] for tasks assessed/performed          Past Medical History:  Diagnosis Date   Allergy Lavender   Anxiety 11/02/2020   Cerebral palsy (HCC)    Depression 11/02/2020   Dysmenorrhea    History of abnormal cervical Pap smear    + HPV   History of cerebral palsy    History of pulmonary embolism    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 [facility])    Past Surgical History:  Procedure Laterality Date   COLPOSCOPY     Patient Active Problem List   Diagnosis Date Noted   Symptomatic mammary hypertrophy 11/05/2023   Personal history of pulmonary embolism 05/17/2023   Severe obstructive sleep apnea-hypopnea syndrome 02/14/2023   Snoring 01/16/2023   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   Cerebral palsy (HCC)    Back pain 10/17/2017   Right hemiplegia (HCC) 10/17/2017   History of cerebral palsy     ONSET DATE: 12/31/2023 (referral date)   REFERRING DIAG: N62 (ICD-10-CM) - Symptomatic mammary hypertrophy  THERAPY DIAG:  Spastic hemiplegia affecting right nondominant side, unspecified etiology (HCC)  Stiffness of right elbow, not elsewhere classified  Other chronic pain  Unsteadiness on feet  Muscle weakness (generalized)  Rationale for Evaluation and Treatment: Rehabilitation  SUBJECTIVE:   SUBJECTIVE STATEMENT: I have an appointment for the botox 02/20/24. No falls  Chronic pain both hips and knees  (Rt side worse) and Rt elbow, upper traps, Rt side of spine and lower back 5-7/10 My arm from elbow down gets cold and fingernails turn blue b/c I forget to move it.  Pt accompanied by: self  PERTINENT HISTORY: baseline hemiplegia d/t CP, CP, anxiety, depression, h/o PE, Mammary hypertrophy  PRECAUTIONS: Fall  WEIGHT BEARING RESTRICTIONS: No  PAIN:  Are you having pain? Chronic (see subjective)   FALLS: Has patient fallen in last 6 months? No  LIVING ENVIRONMENT: Lives with: lives with their spouse Lives in: first floor apt Has following equipment at home: None  PLOF: Independent with basic ADLs, Vocation/Vocational requirements: office work, and Leisure: reading *Uses RUE as stabilizer to min assist  PATIENT GOALS: to get brace/splint for day and night  OBJECTIVE:  Note: Objective measures were completed at Evaluation unless otherwise noted.  HAND DOMINANCE: Left  ADLs: Overall ADLs: mod I to occasional min assist Transfers/ambulation related to ADLs: independent Eating: husband cuts food Grooming: difficulty styling hair UB Dressing: bra difficult and gets husband to hook at times, wearing sports bras now LB Dressing: difficulty pulling up pants/underwear on Rt side, has husband tie shoes or wears slip on shoes Toileting: wiping difficult, difficulty pulling up clothes on Rt side Bathing: mod I with grab bars, LH sponge Tub Shower transfers: mod I w/ use of grab bars Equipment: Grab bars - would benefit from shower chair or bench for balance  IADLs: Shopping: pt/husband do together Light housekeeping: husband doing the last 5 years Meal Prep: Pt does minimal cooking, husband does most cooking Community mobility: pt drives Medication management: independent Landscape architect: independent Handwriting: Lt handed - no issues  MOBILITY STATUS: Independent   UPPER EXTREMITY ROM:  LUE AROM WNL's. RUE AROM WFL's at shoulder. Elbow ext approx -45*, wrist ext to  neutral but compensating in fingers w/ hyperextension in PIP joints and sometimes in MP joints. Pt with difficulty making fist d/t spasticity.  However, passively can get pt easily into resting hand position  HAND FUNCTION: Pt uses RUE more proximally as stabilizer to min assist but does not use Rt hand  COORDINATION: Doe not use Rt hand for function  SENSATION: Diminished, tingling and sometimes cold Rt hand  EDEMA: mild dorsal Rt wrist  MUSCLE TONE: RUE: Hypertonic and dystonia   COGNITION: Overall cognitive status: Within functional limits for tasks assessed  VISION: Subjective report: vision ok right now Baseline vision: Wears glasses all the time Visual history: mild scotomas  PERCEPTION: Not tested  PRAXIS: Not tested  OBSERVATIONS: very pleasant, good insight, Rt spastic hemiplegia from CP (elbow distally worse)                                                                                                                              TREATMENT DATE: 01/09/24    Fabricated and fitted resting hand splint for RUE to support wrist in some extension while also preventing PIP/DIP hyperextension. Issued splint.  Reviewed wear and care of splint including: how to clean, things to avoid, and wearing schedule.   PATIENT EDUCATION: Education details: splint wear and care Person educated: Patient Education method: Explanation and Verbal cues Education comprehension: verbalized understanding  HOME EXERCISE PROGRAM: N/A   GOALS: Goals reviewed with patient? Yes  SHORT TERM GOALS: Target date: 01/25/24  Independent with splint wear and care for Rt hand/wrist (daytime and pm wear)  Baseline: Goal status: IN PROGRESS   2.  Pt to verbalize understanding of A/E and DME needs to increase ease/independence and safety with ADLS (rocker knife, one handed cutting board, shoe lace options, one handed can and jar openers, shower chair, adapted hair clips, and bra angel)   Baseline:  Goal status: IN PROGRESS     LONG TERM GOALS: Target date: 02/15/24  Independent with HEP for RUE Baseline:  Goal status: INITIAL  2.  Pt to obtain elbow orthotic for spasticity management and to hopefully increase elbow extension RUE Baseline:  Goal status: INITIAL   ASSESSMENT:  CLINICAL IMPRESSION: Patient  seen today for occupational therapy treatment for spastic hemiplegia RUE d/t cerebral palsy. Hx includes CP, symptomatic mammary hypertrophy, PE. Focus today on splint fabrication. Patient currently presents near baseline level of functioning demonstrating functional deficits and impairments as noted below. Pt would benefit from skilled OT services in the outpatient setting to work on splinting needs, A/E and DME needs to increase safety,  ease, and independence with ADLS  PERFORMANCE DEFICITS: in functional skills including ADLs, IADLs, coordination, sensation, edema, tone, ROM, mobility, balance, body mechanics, decreased knowledge of use of DME, and UE functional use.   IMPAIRMENTS: are limiting patient from ADLs and IADLs.   CO-MORBIDITIES: may have co-morbidities  that affects occupational performance. Patient will benefit from skilled OT to address above impairments and improve overall function.  MODIFICATION OR ASSISTANCE TO COMPLETE EVALUATION: No modification of tasks or assist necessary to complete an evaluation.  OT OCCUPATIONAL PROFILE AND HISTORY: Problem focused assessment: Including review of records relating to presenting problem.  CLINICAL DECISION MAKING: LOW - limited treatment options, no task modification necessary  REHAB POTENTIAL: Good  EVALUATION COMPLEXITY: Low    PLAN:  OT FREQUENCY: 1-2x/week  OT DURATION: 6 weeks  PLANNED INTERVENTIONS: 97535 self care/ADL training, 44034 therapeutic exercise, 97530 therapeutic activity, 97112 neuromuscular re-education, 97140 manual therapy, 97760 Orthotic Initial, [id number] Prosthetic Initial,  97763 Orthotic/Prosthetic subsequent, passive range of motion, functional mobility training, patient/family education, and DME and/or AE instructions  RECOMMENDED OTHER SERVICES: Recommended pt discuss with neurologist possible botox for RUE elbow and wrist flexors  CONSULTED AND AGREED WITH PLAN OF CARE: Patient  PLAN FOR NEXT SESSION: splint adjustments prn, address STG #2   Velinda Getting, OT 01/09/2024, 2:58 PM

## 2024-01-10 ENCOUNTER — Encounter: Payer: Self-pay | Admitting: Radiology

## 2024-01-10 ENCOUNTER — Ambulatory Visit (INDEPENDENT_AMBULATORY_CARE_PROVIDER_SITE_OTHER): Payer: Self-pay | Admitting: Radiology

## 2024-01-10 VITALS — BP 126/84 | HR 75 | Ht 65.5 in | Wt 234.2 lb

## 2024-01-10 DIAGNOSIS — Z01419 Encounter for gynecological examination (general) (routine) without abnormal findings: Secondary | ICD-10-CM

## 2024-01-10 DIAGNOSIS — G4733 Obstructive sleep apnea (adult) (pediatric): Secondary | ICD-10-CM | POA: Diagnosis not present

## 2024-01-10 DIAGNOSIS — Z1331 Encounter for screening for depression: Secondary | ICD-10-CM | POA: Diagnosis not present

## 2024-01-10 DIAGNOSIS — Z113 Encounter for screening for infections with a predominantly sexual mode of transmission: Secondary | ICD-10-CM | POA: Diagnosis not present

## 2024-01-10 MED ORDER — ZEPBOUND 2.5 MG/0.5ML ~~LOC~~ SOAJ: 2.5000 mg | SUBCUTANEOUS | 0 refills | Status: DC

## 2024-01-10 NOTE — Telephone Encounter (Signed)
 Xeomin copay card:

## 2024-01-10 NOTE — Patient Instructions (Signed)

## 2024-01-10 NOTE — Addendum Note (Signed)
 Addended by: Mariel Shope on: 01/10/2024 08:21 AM   Modules accepted: Orders

## 2024-01-10 NOTE — Progress Notes (Signed)
 Elizabeth Stanton 04/30/1991 [id number]   History:  33 y.o. G0 presents for annual exam. Severe sleep apnea, AHI 53. Interested in zepbound to help. Planning for a breast reduction this year. Currently on a high protein diet, has lost some weight. Would like STI screening. Currently separated, no new partners.  Gynecologic History Patient's last menstrual period was 01/02/2024 (exact date). Period Cycle (Days): 28 Period Duration (Days): 5 Period Pattern: Regular Menstrual Flow: Moderate Menstrual Control: Tampon Dysmenorrhea: (!) Moderate Dysmenorrhea Symptoms: Cramping Contraception/Family planning: IUD Sexually active: yes Last Pap: 2024. Results were: normal   Obstetric History OB History  Gravida Para Term Preterm AB Living  0 0 0 0 0 0  SAB IAB Ectopic Multiple Live Births  0 0 0 0 0       01/10/2024    3:44 PM 12/20/2023    2:14 PM 12/11/2022    8:55 AM  Depression screen PHQ 2/9  Decreased Interest 0 0 0  Down, Depressed, Hopeless 0 0 0  PHQ - 2 Score 0 0 0  Altered sleeping   0  Tired, decreased energy   0  Change in appetite   0  Feeling bad or failure about yourself    0  Trouble concentrating   0  Moving slowly or fidgety/restless   0  Suicidal thoughts   0  PHQ-9 Score   0     The following portions of the patient's history were reviewed and updated as appropriate: allergies, current medications, past family history, past medical history, past social history, past surgical history, and problem list.  Review of Systems  All other systems reviewed and are negative.   Past medical history, past surgical history, family history and social history were all reviewed and documented in the EPIC chart.  Exam:  Vitals:   01/10/24 1545  BP: 126/84  Pulse: 75  SpO2: 97%  Weight: 234 lb 3.2 oz (106.2 kg)  Height: 5' 5.5 (1.664 m)   Body mass index is 38.38 kg/m.  Physical Exam Vitals and nursing note reviewed. Exam conducted with a chaperone present.   Constitutional:      Appearance: Normal appearance. She is obese.  HENT:     Head: Normocephalic and atraumatic.  Neck:     Thyroid : No thyroid  mass, thyromegaly or thyroid  tenderness.   Cardiovascular:     Rate and Rhythm: Regular rhythm.     Heart sounds: Normal heart sounds.  Pulmonary:     Effort: Pulmonary effort is normal.     Breath sounds: Normal breath sounds.  Chest:  Breasts:    Breasts are symmetrical.     Right: Normal. No inverted nipple, mass, nipple discharge, skin change or tenderness.     Left: Normal. No inverted nipple, mass, nipple discharge, skin change or tenderness.     Comments: Large pendulous breasts Abdominal:     General: Abdomen is flat. Bowel sounds are normal.     Palpations: Abdomen is soft.  Genitourinary:    General: Normal vulva.     Vagina: Normal. No vaginal discharge, bleeding or lesions.     Cervix: Normal. No discharge or lesion.     Uterus: Normal. Not enlarged and not tender.      Adnexa: Right adnexa normal and left adnexa normal.       Right: No mass, tenderness or fullness.         Left: No mass, tenderness or fullness.    Lymphadenopathy:     Upper Body:  Right upper body: No axillary adenopathy.     Left upper body: No axillary adenopathy.   Skin:    General: Skin is warm and dry.   Neurological:     Mental Status: She is alert and oriented to person, place, and time.   Psychiatric:        Mood and Affect: Mood normal.        Thought Content: Thought content normal.        Judgment: Judgment normal.      Ellis Guys, CMA present for exam  Assessment/Plan:   1. Well woman exam with routine gynecological exam (Primary) Pap 2027  2. Screen for STD (sexually transmitted disease) - SURESWAB CT/NG/T. vaginalis - HIV antibody (with reflex) - RPR - Hepatitis C antibody  3. Severe obstructive sleep apnea-hypopnea syndrome AHI 53 - tirzepatide (ZEPBOUND) 2.5 MG/0.5ML Pen; Inject 2.5 mg into the skin once a week.   Dispense: 2 mL; Refill: 0   4. Depression screening negative    Return in about 1 month (around 02/09/2024) for Med Follow-up.  Haliey Romberg B WHNP-BC 4:16 PM 01/10/2024

## 2024-01-11 ENCOUNTER — Other Ambulatory Visit (HOSPITAL_COMMUNITY): Payer: Self-pay

## 2024-01-11 ENCOUNTER — Other Ambulatory Visit: Payer: Self-pay

## 2024-01-11 LAB — HEPATITIS C ANTIBODY: Hepatitis C Ab: NONREACTIVE

## 2024-01-11 LAB — SURESWAB CT/NG/T. VAGINALIS
C. trachomatis RNA, TMA: DETECTED — AB
N. gonorrhoeae RNA, TMA: NOT DETECTED
Trichomonas vaginalis RNA: NOT DETECTED

## 2024-01-11 LAB — HIV ANTIBODY (ROUTINE TESTING W REFLEX): HIV 1&2 Ab, 4th Generation: NONREACTIVE

## 2024-01-11 LAB — RPR: RPR Ser Ql: NONREACTIVE

## 2024-01-14 ENCOUNTER — Ambulatory Visit: Payer: Self-pay | Admitting: Radiology

## 2024-01-14 MED ORDER — DOXYCYCLINE HYCLATE 100 MG PO TABS: 100.0000 mg | ORAL_TABLET | Freq: Two times a day (BID) | ORAL | 0 refills | Status: DC

## 2024-01-15 ENCOUNTER — Other Ambulatory Visit: Payer: Self-pay | Admitting: Radiology

## 2024-01-15 ENCOUNTER — Other Ambulatory Visit (HOSPITAL_BASED_OUTPATIENT_CLINIC_OR_DEPARTMENT_OTHER): Payer: Self-pay

## 2024-01-15 DIAGNOSIS — G4733 Obstructive sleep apnea (adult) (pediatric): Secondary | ICD-10-CM

## 2024-01-15 MED ORDER — ZEPBOUND 2.5 MG/0.5ML ~~LOC~~ SOAJ
2.5000 mg | SUBCUTANEOUS | 0 refills | Status: DC
  Filled 2024-01-15: qty 2, 28d supply, fill #0

## 2024-01-16 ENCOUNTER — Other Ambulatory Visit (HOSPITAL_BASED_OUTPATIENT_CLINIC_OR_DEPARTMENT_OTHER): Payer: Self-pay

## 2024-01-16 ENCOUNTER — Encounter (HOSPITAL_BASED_OUTPATIENT_CLINIC_OR_DEPARTMENT_OTHER): Payer: Self-pay

## 2024-01-21 ENCOUNTER — Other Ambulatory Visit: Payer: Self-pay

## 2024-01-21 ENCOUNTER — Other Ambulatory Visit (HOSPITAL_COMMUNITY): Payer: Self-pay

## 2024-01-21 NOTE — Progress Notes (Signed)
 Specialty Pharmacy Initial Fill Coordination Note  Elizabeth Stanton is a 33 y.o. female contacted today regarding initial fill of specialty medication(s) IncobotulinumtoxinA (XEOMIN)   Patient requested Courier to Provider Office   Delivery date: 02/12/24   Verified address: [facility], [address], [place], [place] [address]   Medication will be filled on 02/12/2024.   Patient is aware of 0 copayment.

## 2024-01-24 ENCOUNTER — Encounter: Payer: Self-pay | Admitting: Occupational Therapy

## 2024-01-24 ENCOUNTER — Ambulatory Visit: Attending: Student | Admitting: Occupational Therapy

## 2024-01-24 DIAGNOSIS — M25621 Stiffness of right elbow, not elsewhere classified: Secondary | ICD-10-CM | POA: Insufficient documentation

## 2024-01-24 DIAGNOSIS — M6281 Muscle weakness (generalized): Secondary | ICD-10-CM | POA: Insufficient documentation

## 2024-01-24 DIAGNOSIS — G8929 Other chronic pain: Secondary | ICD-10-CM | POA: Diagnosis present

## 2024-01-24 DIAGNOSIS — R293 Abnormal posture: Secondary | ICD-10-CM | POA: Insufficient documentation

## 2024-01-24 DIAGNOSIS — G8113 Spastic hemiplegia affecting right nondominant side: Secondary | ICD-10-CM | POA: Diagnosis present

## 2024-01-24 IMAGING — DX DG FOOT COMPLETE 3+V*R*
3 series · 3 of 3 positions shown · non-contrast
Comparison: None Available.

CLINICAL DATA: Right foot and ankle pain. Fall on wet [HOSPITAL] days
ago.

EXAM:
RIGHT ANKLE - COMPLETE 3+ VIEW; RIGHT FOOT COMPLETE - 3+ VIEW

[foot supine dp]
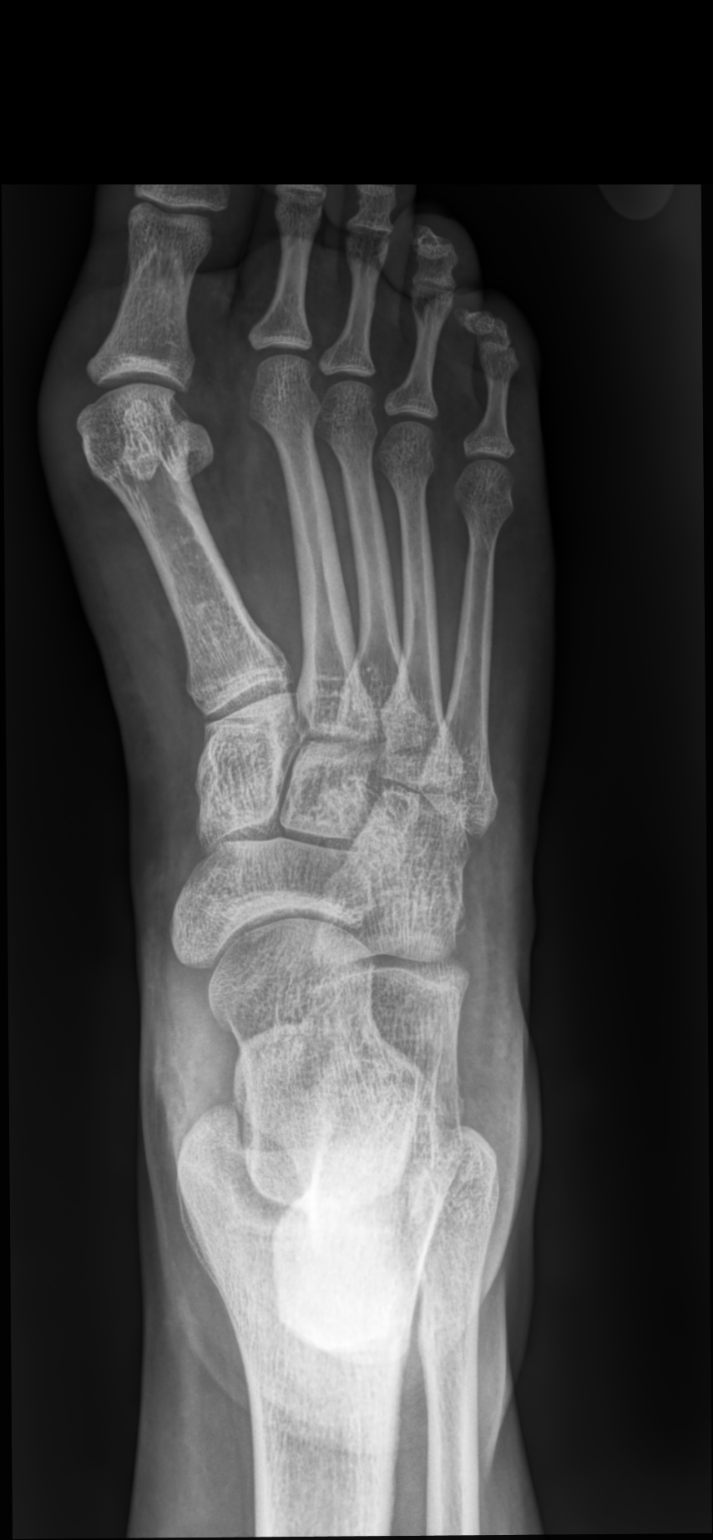

[foot medial oblique]
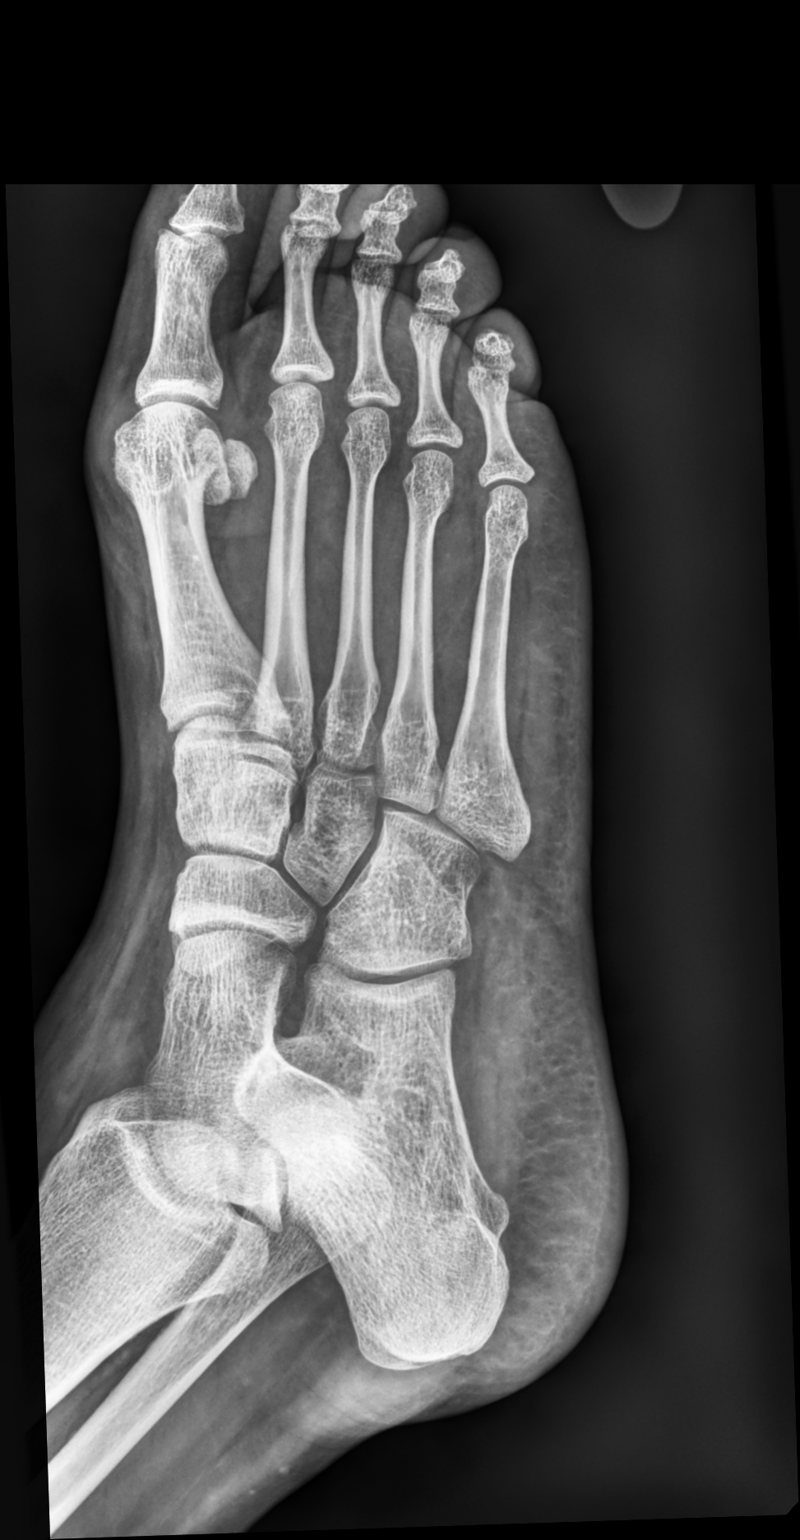

[foot supine lat]
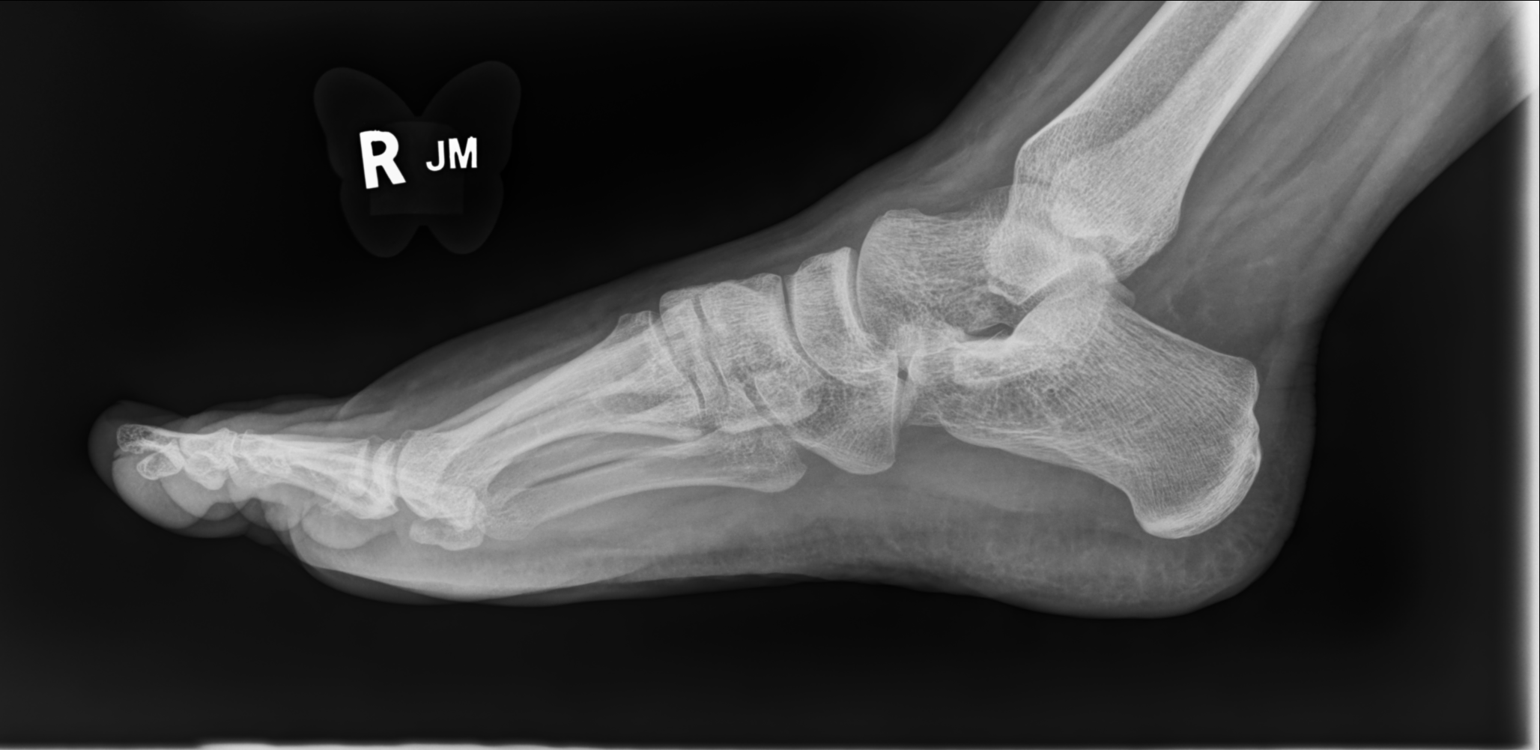

[3 of 3 positions shown; findings below may reference images not displayed]

FINDINGS: There is no evidence of fracture, dislocation, or joint effusion.
There is no evidence of arthropathy or other focal bone abnormality.
Soft tissues are unremarkable.
IMPRESSION: Negative.

## 2024-01-24 NOTE — Therapy (Signed)
 OUTPATIENT OCCUPATIONAL THERAPY NEURO TREATMENT  Patient Name: Elizabeth Stanton MRN: [id number] DOB:Sep 07, 1990, 33 y.o., female Today's Date: 01/24/2024  PCP: Corlis Pagan, NP REFERRING PROVIDER: Andris Estefana BRAVO, PA-C  END OF SESSION:  OT End of Session - 01/24/24 0852     Visit Number 3    Number of Visits 8    Date for OT Re-Evaluation 02/15/24    Authorization Type [employer]et[employer] - no auth required    OT Start Time 0850    OT Stop Time 0935    OT Time Calculation (min) 45 min    Activity Tolerance Patient tolerated treatment well    Behavior During Therapy [facility] for tasks assessed/performed          Past Medical History:  Diagnosis Date   Allergy Lavender   Anxiety 11/02/2020   Cerebral palsy (HCC)    Depression 11/02/2020   Dysmenorrhea    History of abnormal cervical Pap smear    + HPV   History of cerebral palsy    History of pulmonary embolism    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 [facility])    Past Surgical History:  Procedure Laterality Date   COLPOSCOPY     Patient Active Problem List   Diagnosis Date Noted   Symptomatic mammary hypertrophy 11/05/2023   Personal history of pulmonary embolism 05/17/2023   Severe obstructive sleep apnea-hypopnea syndrome 02/14/2023   Snoring 01/16/2023   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 Splint is doing great. I got denied for the breast reduction but they will resubmit later  Chronic pain both hips and knees (Rt side worse)  and Rt elbow, upper traps, Rt side of spine and lower back 5-7/10 My arm from elbow down gets cold and fingernails turn blue b/c I forget to move it.  Pt accompanied by: self  PERTINENT HISTORY: baseline hemiplegia d/t CP, CP, anxiety, depression, h/o PE, Mammary hypertrophy  PRECAUTIONS: Fall  WEIGHT BEARING RESTRICTIONS: No  PAIN:  Are you having pain? Chronic (see subjective)   FALLS: Has patient fallen in last 6 months? No  LIVING ENVIRONMENT: Lives with: lives with their spouse Lives in: first floor apt Has following equipment at home: None  PLOF: Independent with basic ADLs, Vocation/Vocational requirements: office work, and Leisure: reading *Uses RUE as stabilizer to min assist  PATIENT GOALS: to get brace/splint for day and night  OBJECTIVE:  Note: Objective measures were completed at Evaluation unless otherwise noted.  HAND DOMINANCE: Left  ADLs: Overall ADLs: mod I to occasional min assist Transfers/ambulation related to ADLs: independent Eating: husband cuts food Grooming: difficulty styling hair UB Dressing: bra difficult and gets husband to hook at times, wearing sports bras now LB Dressing: difficulty pulling up pants/underwear on Rt side, has husband tie shoes or wears slip on shoes Toileting: wiping difficult, difficulty pulling up clothes on Rt side Bathing: mod I with grab bars, LH sponge Tub Shower transfers: mod I w/ use of grab bars Equipment: Grab bars - would benefit from shower chair or bench for balance  IADLs: Shopping: pt/husband do together Light housekeeping: husband doing the last 5 years Meal Prep: Pt does minimal cooking, husband does most cooking Community mobility: pt drives Medication management: independent Landscape architect: independent Handwriting: Lt handed - no issues  MOBILITY STATUS: Independent   UPPER EXTREMITY ROM:  LUE AROM WNL's. RUE AROM WFL's at shoulder. Elbow ext approx -45*, wrist ext to neutral but  compensating in fingers w/ hyperextension in PIP joints and sometimes in MP joints. Pt with difficulty making fist d/t spasticity.  However, passively can get pt easily into resting hand position  HAND FUNCTION: Pt uses RUE more proximally as stabilizer to min assist but does not use Rt hand  COORDINATION: Doe not use Rt hand for function  SENSATION: Diminished, tingling and sometimes cold Rt hand  EDEMA: mild dorsal Rt wrist  MUSCLE TONE: RUE: Hypertonic and dystonia   COGNITION: Overall cognitive status: Within functional limits for tasks assessed  VISION: Subjective report: vision ok right now Baseline vision: Wears glasses all the time Visual history: mild scotomas  PERCEPTION: Not tested  PRAXIS: Not tested  OBSERVATIONS: very pleasant, good insight, Rt spastic hemiplegia from CP (elbow distally worse)                                                                                                                              TREATMENT DATE: 01/24/24   Pt reports resting hand splint is doing great with no adjustments needed.   Addressed STG #2 today - pt shown various A/E to increase ease and/or independence with ADLS (typing shoes, cutting food, hooking bra, chopping veggies, styling hair, etc) and was provided with handouts and where/how to purchase. Pt very appreciative and was excited to try shoe buttons, rocker knife, and bra angel.   Also discussed one handed strategies for taking out trash, getting groceries, etc.   PATIENT EDUCATION: Education details: see above Person educated: Patient Education method: Explanation, Demonstration, Verbal cues, and Handouts Education comprehension: verbalized understanding and returned demonstration  HOME EXERCISE PROGRAM: N/A   GOALS: Goals reviewed with patient? Yes  SHORT TERM GOALS: Target date: 01/25/24  Independent with splint wear and care for Rt hand/wrist (daytime and pm wear)  Baseline: Goal status:  MET  2.  Pt to verbalize understanding of A/E and DME needs to increase ease/independence and safety with ADLS (rocker knife, one handed cutting board, shoe lace options, one handed can and jar openers, shower chair, adapted hair clips, and bra angel)  Baseline:  Goal status: MET    LONG TERM GOALS: Target date: 02/15/24  Independent with HEP for RUE Baseline:  Goal status: INITIAL  2.  Pt to obtain elbow orthotic for spasticity management and to hopefully increase elbow extension RUE Baseline:  Goal status: INITIAL   ASSESSMENT:  CLINICAL IMPRESSION: Patient  seen today for occupational therapy treatment for spastic hemiplegia RUE d/t cerebral palsy. Hx includes CP, symptomatic mammary hypertrophy, PE. Focus today on A/E needs  and task modifications prn. Pt has met all STG's. Patient currently presents near baseline level of functioning demonstrating functional deficits and impairments as noted below. Pt would benefit from skilled OT services in the outpatient setting to work on splinting needs, A/E and DME needs to increase safety, ease, and independence with ADLS  PERFORMANCE DEFICITS: in functional skills including ADLs, IADLs, coordination, sensation, edema, tone, ROM, mobility, balance, body mechanics, decreased knowledge of use of DME, and UE functional use.   IMPAIRMENTS: are limiting patient from ADLs and IADLs.   CO-MORBIDITIES: may have co-morbidities  that affects occupational performance. Patient will benefit from skilled OT to address above impairments and improve overall function.  MODIFICATION OR ASSISTANCE TO COMPLETE EVALUATION: No modification of tasks or assist necessary to complete an evaluation.  OT OCCUPATIONAL PROFILE AND HISTORY: Problem focused assessment: Including review of records relating to presenting problem.  CLINICAL DECISION MAKING: LOW - limited treatment options, no task modification necessary  REHAB POTENTIAL: Good  EVALUATION COMPLEXITY:  Low    PLAN:  OT FREQUENCY: 1-2x/week  OT DURATION: 6 weeks  PLANNED INTERVENTIONS: 97535 self care/ADL training, 02889 therapeutic exercise, 97530 therapeutic activity, 97112 neuromuscular re-education, 97140 manual therapy, 97760 Orthotic Initial, [id number] Prosthetic Initial, 97763 Orthotic/Prosthetic subsequent, passive range of motion, functional mobility training, patient/family education, and DME and/or AE instructions  RECOMMENDED OTHER SERVICES: Recommended pt discuss with neurologist possible botox for RUE elbow and wrist flexors  CONSULTED AND AGREED WITH PLAN OF CARE: Patient  PLAN FOR NEXT SESSION: address LTG's (HEP for RUE to include stretches, self P/ROM, and some simple functional tasks RUE as able)    Burnard JINNY Roads, OT 01/24/2024, 11:40 AM

## 2024-01-28 ENCOUNTER — Encounter: Payer: Self-pay | Admitting: Occupational Therapy

## 2024-01-28 ENCOUNTER — Ambulatory Visit: Admitting: Occupational Therapy

## 2024-01-28 DIAGNOSIS — G8113 Spastic hemiplegia affecting right nondominant side: Secondary | ICD-10-CM

## 2024-01-28 DIAGNOSIS — R293 Abnormal posture: Secondary | ICD-10-CM

## 2024-01-28 DIAGNOSIS — M6281 Muscle weakness (generalized): Secondary | ICD-10-CM

## 2024-01-28 DIAGNOSIS — M25621 Stiffness of right elbow, not elsewhere classified: Secondary | ICD-10-CM

## 2024-01-28 NOTE — Patient Instructions (Addendum)
 Flexion (Assistive)    Hold Rt wrist w/ Lt hand and raise arms above head, keeping elbows as straight as possible. Can be done sitting or lying. Repeat _10___ times. Do __2-3__ sessions per day.   Flexion (Passive)    Sitting upright, place Lt hand over Rt hand (folded towel underneath) and slide forearms forward along table, bending from the waist until a stretch is felt. Hold _5___ seconds. Repeat _10___ times. Do __2-3__ sessions per day. Then do big circle clockwise x 10    Supination (Passive)    Keep elbow bent at right angle and held firmly at side. Use other hand to turn Rt forearm until palm faces upward. Hold _10___ seconds. Repeat __5__ times. Do __3__ sessions per day.   Extension (Passive)    Using other hand, lift hand at wrist as far as possible. Hold __10__ seconds. Repeat __5__ times. Do __3__ sessions per day.    USE RT HAND/ARM FOR FUNCTIONAL TASKS:   1) To assist folding towels/clothes  2) Holding light groceries  3) Pick up cups and bring to mouth, stack cups, move cups along table  4) Pick up legos or checkers and place in cup or bowl  5) Flip large cards over

## 2024-01-28 NOTE — Therapy (Signed)
 OUTPATIENT OCCUPATIONAL THERAPY NEURO TREATMENT  Patient Name: Elizabeth Stanton MRN: [id number] DOB:06-01-1991, 33 y.o., female Today's Date: 01/28/2024  PCP: Corlis Pagan, NP REFERRING PROVIDER: Andris Estefana BRAVO, PA-C  END OF SESSION:  OT End of Session - 01/28/24 0802     Visit Number 4    Number of Visits 8    Date for OT Re-Evaluation 02/15/24    Authorization Type Aetna - no auth required    OT Start Time 0800    OT Stop Time 0845    OT Time Calculation (min) 45 min    Activity Tolerance Patient tolerated treatment well    Behavior During Therapy [facility] for tasks assessed/performed          Past Medical History:  Diagnosis Date   Allergy Lavender   Anxiety 11/02/2020   Cerebral palsy (HCC)    Depression 11/02/2020   Dysmenorrhea    History of abnormal cervical Pap smear    + HPV   History of cerebral palsy    History of pulmonary embolism    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 [facility])    Past Surgical History:  Procedure Laterality Date   COLPOSCOPY     Patient Active Problem List   Diagnosis Date Noted   Symptomatic mammary hypertrophy 11/05/2023   Personal history of pulmonary embolism 05/17/2023   Severe obstructive sleep apnea-hypopnea syndrome 02/14/2023   Snoring 01/16/2023   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 Everything is going well.   Chronic pain both hips and knees (Rt side worse) and Rt elbow, upper  traps, Rt side of spine and lower back 5-7/10 My arm from elbow down gets cold and fingernails turn blue b/c I forget to move it.  Pt accompanied by: self  PERTINENT HISTORY: baseline hemiplegia d/t CP, CP, anxiety, depression, h/o PE, Mammary hypertrophy  PRECAUTIONS: Fall  WEIGHT BEARING RESTRICTIONS: No  PAIN:  Are you having pain? Chronic (see subjective)   FALLS: Has patient fallen in last 6 months? No  LIVING ENVIRONMENT: Lives with: lives with their spouse Lives in: first floor apt Has following equipment at home: None  PLOF: Independent with basic ADLs, Vocation/Vocational requirements: office work, and Leisure: reading *Uses RUE as stabilizer to min assist  PATIENT GOALS: to get brace/splint for day and night  OBJECTIVE:  Note: Objective measures were completed at Evaluation unless otherwise noted.  HAND DOMINANCE: Left  ADLs: Overall ADLs: mod I to occasional min assist Transfers/ambulation related to ADLs: independent Eating: husband cuts food Grooming: difficulty styling hair UB Dressing: bra difficult and gets husband to hook at times, wearing sports bras now LB Dressing: difficulty pulling up pants/underwear on Rt side, has husband tie shoes or wears slip on shoes Toileting: wiping difficult, difficulty pulling up clothes on Rt side Bathing: mod I with grab bars, LH sponge Tub Shower transfers: mod I w/ use of grab bars Equipment: Grab bars - would benefit from shower chair or bench for balance  IADLs: Shopping: pt/husband do together  Light housekeeping: husband doing the last 5 years Meal Prep: Pt does minimal cooking, husband does most cooking Community mobility: pt drives Medication management: independent Landscape architect: independent Handwriting: Lt handed - no issues  MOBILITY STATUS: Independent   UPPER EXTREMITY ROM:  LUE AROM WNL's. RUE AROM WFL's at shoulder. Elbow ext approx -45*, wrist ext to neutral but compensating in fingers w/  hyperextension in PIP joints and sometimes in MP joints. Pt with difficulty making fist d/t spasticity.  However, passively can get pt easily into resting hand position  HAND FUNCTION: Pt uses RUE more proximally as stabilizer to min assist but does not use Rt hand  COORDINATION: Doe not use Rt hand for function  SENSATION: Diminished, tingling and sometimes cold Rt hand  EDEMA: mild dorsal Rt wrist  MUSCLE TONE: RUE: Hypertonic and dystonia   COGNITION: Overall cognitive status: Within functional limits for tasks assessed  VISION: Subjective report: vision ok right now Baseline vision: Wears glasses all the time Visual history: mild scotomas  PERCEPTION: Not tested  PRAXIS: Not tested  OBSERVATIONS: very pleasant, good insight, Rt spastic hemiplegia from CP (elbow distally worse)                                                                                                                              TREATMENT DATE: 01/28/24   Pt issued HEP for RUE including: self P/ROM and stretches as well as some functional tasks for RUE - see pt instructions for details. Pt return demo of each  Pt did first exercise better in supine  PATIENT EDUCATION: Education details: see above Person educated: Patient Education method: Explanation, Demonstration, Verbal cues, and Handouts Education comprehension: verbalized understanding and returned demonstration  HOME EXERCISE PROGRAM: 01/28/24: RUE HEP    GOALS: Goals reviewed with patient? Yes  SHORT TERM GOALS: Target date: 01/25/24  Independent with splint wear and care for Rt hand/wrist (daytime and pm wear)  Baseline: Goal status: MET  2.  Pt to verbalize understanding of A/E and DME needs to increase ease/independence and safety with ADLS (rocker knife, one handed cutting board, shoe lace options, one handed can and jar openers, shower chair, adapted hair clips, and bra angel)  Baseline:  Goal status: MET    LONG TERM  GOALS: Target date: 02/15/24  Independent with HEP for RUE Baseline:  Goal status: IN PROGRESS   2.  Pt to obtain elbow orthotic for spasticity management and to hopefully increase elbow extension RUE Baseline:  Goal status: INITIAL   ASSESSMENT:  CLINICAL IMPRESSION: Patient  seen today for occupational therapy treatment for spastic hemiplegia RUE d/t cerebral palsy. Hx includes CP, symptomatic mammary hypertrophy, PE. Focus today on A/E needs and task modifications prn. Pt has met all STG's and progressing towards LTGs.   PERFORMANCE DEFICITS: in functional skills including ADLs, IADLs, coordination, sensation, edema, tone, ROM, mobility, balance, body mechanics, decreased knowledge of use of DME, and UE functional use.  IMPAIRMENTS: are limiting patient from ADLs and IADLs.   CO-MORBIDITIES: may have co-morbidities  that affects occupational performance. Patient will benefit from skilled OT to address above impairments and improve overall function.  MODIFICATION OR ASSISTANCE TO COMPLETE EVALUATION: No modification of tasks or assist necessary to complete an evaluation.  OT OCCUPATIONAL PROFILE AND HISTORY: Problem focused assessment: Including review of records relating to presenting problem.  CLINICAL DECISION MAKING: LOW - limited treatment options, no task modification necessary  REHAB POTENTIAL: Good  EVALUATION COMPLEXITY: Low    PLAN:  OT FREQUENCY: 1-2x/week  OT DURATION: 6 weeks  PLANNED INTERVENTIONS: 97535 self care/ADL training, 02889 therapeutic exercise, 97530 therapeutic activity, 97112 neuromuscular re-education, 97140 manual therapy, 97760 Orthotic Initial, [id number] Prosthetic Initial, 97763 Orthotic/Prosthetic subsequent, passive range of motion, functional mobility training, patient/family education, and DME and/or AE instructions  RECOMMENDED OTHER SERVICES: Recommended pt discuss with neurologist possible botox for RUE elbow and wrist  flexors  CONSULTED AND AGREED WITH PLAN OF CARE: Patient  PLAN FOR NEXT SESSION: address LTG's - review HEP and begin assessing for LUE elbow brace   Burnard JINNY Roads, OT 01/28/2024, 8:02 AM

## 2024-02-06 ENCOUNTER — Ambulatory Visit: Admitting: Occupational Therapy

## 2024-02-06 DIAGNOSIS — M25621 Stiffness of right elbow, not elsewhere classified: Secondary | ICD-10-CM

## 2024-02-06 DIAGNOSIS — M6281 Muscle weakness (generalized): Secondary | ICD-10-CM

## 2024-02-06 DIAGNOSIS — G8113 Spastic hemiplegia affecting right nondominant side: Secondary | ICD-10-CM

## 2024-02-06 NOTE — Therapy (Signed)
 OUTPATIENT OCCUPATIONAL THERAPY NEURO TREATMENT  Patient Name: Elizabeth Stanton MRN: [id number] DOB:04/24/1991, 33 y.o., female Today's Date: 02/06/2024  PCP: Corlis Pagan, NP REFERRING PROVIDER: Andris Estefana BRAVO, PA-C  END OF SESSION:  OT End of Session - 02/06/24 0802     Visit Number 5    Number of Visits 8    Date for OT Re-Evaluation 02/15/24    Authorization Type [employer] - no auth required    OT Start Time 0800    OT Stop Time 0845    OT Time Calculation (min) 45 min    Activity Tolerance Patient tolerated treatment well    Behavior During Therapy [facility] for tasks assessed/performed          Past Medical History:  Diagnosis Date   Allergy Lavender   Anxiety 11/02/2020   Cerebral palsy (HCC)    Depression 11/02/2020   Dysmenorrhea    History of abnormal cervical Pap smear    + HPV   History of cerebral palsy    History of pulmonary embolism    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 [facility])    Past Surgical History:  Procedure Laterality Date   COLPOSCOPY     Patient Active Problem List   Diagnosis Date Noted   Symptomatic mammary hypertrophy 11/05/2023   Personal history of pulmonary embolism 05/17/2023   Severe obstructive sleep apnea-hypopnea syndrome 02/14/2023   Snoring 01/16/2023   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   Cerebral palsy (HCC)    Back pain 10/17/2017   Right hemiplegia (HCC) 10/17/2017   History of cerebral palsy     ONSET DATE: 12/31/2023 (referral date)   REFERRING DIAG: N62 (ICD-10-CM) - Symptomatic mammary hypertrophy  THERAPY DIAG:  Spastic hemiplegia affecting right nondominant side, unspecified etiology (HCC)  Stiffness of right elbow, not elsewhere classified  Muscle weakness (generalized)  Rationale for Evaluation and Treatment: Rehabilitation  SUBJECTIVE:   SUBJECTIVE STATEMENT: I checked the trash/dumpster at my new apt complex, and it's a slider so I can do it.   Splint is still going well  Chronic pain both  hips and knees (Rt side worse) and Rt elbow, upper traps, Rt side of spine and lower back 5-7/10 My arm from elbow down gets cold and fingernails turn blue b/c I forget to move it.  Pt accompanied by: self  PERTINENT HISTORY: baseline hemiplegia d/t CP, CP, anxiety, depression, h/o PE, Mammary hypertrophy  PRECAUTIONS: Fall  WEIGHT BEARING RESTRICTIONS: No  PAIN:  Are you having pain? Chronic (see subjective)   FALLS: Has patient fallen in last 6 months? No  LIVING ENVIRONMENT: Lives with: lives with their spouse Lives in: first floor apt Has following equipment at home: None  PLOF: Independent with basic ADLs, Vocation/Vocational requirements: office work, and Leisure: reading *Uses RUE as stabilizer to min assist  PATIENT GOALS: to get brace/splint for day and night  OBJECTIVE:  Note: Objective measures were completed at Evaluation unless otherwise noted.  HAND DOMINANCE: Left  ADLs: Overall ADLs: mod I to occasional min assist Transfers/ambulation related to ADLs: independent Eating: husband cuts food Grooming: difficulty styling hair UB Dressing: bra difficult and gets husband to hook at times, wearing sports bras now LB Dressing: difficulty pulling up pants/underwear on Rt side, has husband tie shoes or wears slip on shoes Toileting: wiping difficult, difficulty pulling up clothes on Rt side Bathing: mod I with grab bars, LH sponge Tub Shower transfers: mod I w/ use of grab bars Equipment: Grab bars - would benefit  from shower chair or bench for balance  IADLs: Shopping: pt/husband do together Light housekeeping: husband doing the last 5 years Meal Prep: Pt does minimal cooking, husband does most cooking Community mobility: pt drives Medication management: independent Landscape architect: independent Handwriting: Lt handed - no issues  MOBILITY STATUS: Independent   UPPER EXTREMITY ROM:  LUE AROM WNL's. RUE AROM WFL's at shoulder. Elbow ext approx -45*,  wrist ext to neutral but compensating in fingers w/ hyperextension in PIP joints and sometimes in MP joints. Pt with difficulty making fist d/t spasticity.  However, passively can get pt easily into resting hand position  HAND FUNCTION: Pt uses RUE more proximally as stabilizer to min assist but does not use Rt hand  COORDINATION: Doe not use Rt hand for function  SENSATION: Diminished, tingling and sometimes cold Rt hand  EDEMA: mild dorsal Rt wrist  MUSCLE TONE: RUE: Hypertonic and dystonia   COGNITION: Overall cognitive status: Within functional limits for tasks assessed  VISION: Subjective report: vision ok right now Baseline vision: Wears glasses all the time Visual history: mild scotomas  PERCEPTION: Not tested  PRAXIS: Not tested  OBSERVATIONS: very pleasant, good insight, Rt spastic hemiplegia from CP (elbow distally worse)                                                                                                                              TREATMENT DATE: 02/06/24   Reviewed HEP for RUE including: self P/ROM and stretches as well as some functional tasks for RUE.  Pt return demo of all   Patent examiner [employer]) present to help assist in determining most appropriate elbow brace for RUE elbow extension and tone management, taking measurements for correct size, and getting needed info to file for insurance (per pt approval). Pt agreeable to proceed with ordering device with or without insurance coverage.   Pt also shown additional A/E to increase safety with cooking - pot stabilizer/holder while stirring or flipping.       PATIENT EDUCATION: Education details: see above Person educated: Patient Education method: Programmer, multimedia, Demonstration, Verbal cues, and Handouts Education comprehension: verbalized understanding and returned demonstration  HOME EXERCISE PROGRAM: 01/28/24: RUE HEP    GOALS: Goals reviewed with patient? Yes  SHORT TERM  GOALS: Target date: 01/25/24  Independent with splint wear and care for Rt hand/wrist (daytime and pm wear)  Baseline: Goal status: MET  2.  Pt to verbalize understanding of A/E and DME needs to increase ease/independence and safety with ADLS (rocker knife, one handed cutting board, shoe lace options, one handed can and jar openers, shower chair, adapted hair clips, and bra angel)  Baseline:  Goal status: MET    LONG TERM GOALS: Target date: 02/15/24  Independent with HEP for RUE Baseline:  Goal status: MET  2.  Pt to obtain elbow orthotic for spasticity management and to hopefully increase elbow extension RUE Baseline:  Goal status: IN PROGRESS - awaiting insurance  authorization and brace to come in   ASSESSMENT:  CLINICAL IMPRESSION: Patient  seen today for occupational therapy treatment for spastic hemiplegia RUE d/t cerebral palsy. Hx includes CP, symptomatic mammary hypertrophy, PE. Focus today on review of HEP and assessing for appropriate Rt elbow brace. Pt has met all STG's and 1 LTG.   PERFORMANCE DEFICITS: in functional skills including ADLs, IADLs, coordination, sensation, edema, tone, ROM, mobility, balance, body mechanics, decreased knowledge of use of DME, and UE functional use.   IMPAIRMENTS: are limiting patient from ADLs and IADLs.   CO-MORBIDITIES: may have co-morbidities  that affects occupational performance. Patient will benefit from skilled OT to address above impairments and improve overall function.  MODIFICATION OR ASSISTANCE TO COMPLETE EVALUATION: No modification of tasks or assist necessary to complete an evaluation.  OT OCCUPATIONAL PROFILE AND HISTORY: Problem focused assessment: Including review of records relating to presenting problem.  CLINICAL DECISION MAKING: LOW - limited treatment options, no task modification necessary  REHAB POTENTIAL: Good  EVALUATION COMPLEXITY: Low    PLAN:  OT FREQUENCY: 1-2x/week  OT DURATION: 6  weeks  PLANNED INTERVENTIONS: 97535 self care/ADL training, 02889 therapeutic exercise, 97530 therapeutic activity, 97112 neuromuscular re-education, 97140 manual therapy, 97760 Orthotic Initial, [id number] Prosthetic Initial, 97763 Orthotic/Prosthetic subsequent, passive range of motion, functional mobility training, patient/family education, and DME and/or AE instructions  RECOMMENDED OTHER SERVICES: Recommended pt discuss with neurologist possible botox for RUE elbow and wrist flexors  CONSULTED AND AGREED WITH PLAN OF CARE: Patient  PLAN FOR NEXT SESSION: Will renew next visit to cover brace fitting next session and for additional visit following botox   Jeimy Bickert J Kristian Hazzard, OT 02/06/2024, 8:02 AM

## 2024-02-11 ENCOUNTER — Other Ambulatory Visit: Payer: Self-pay

## 2024-02-14 ENCOUNTER — Encounter (HOSPITAL_BASED_OUTPATIENT_CLINIC_OR_DEPARTMENT_OTHER): Payer: Self-pay

## 2024-02-14 ENCOUNTER — Other Ambulatory Visit (HOSPITAL_BASED_OUTPATIENT_CLINIC_OR_DEPARTMENT_OTHER): Payer: Self-pay

## 2024-02-14 ENCOUNTER — Ambulatory Visit: Admitting: Radiology

## 2024-02-14 ENCOUNTER — Encounter: Payer: Self-pay | Admitting: Radiology

## 2024-02-14 VITALS — BP 116/72 | HR 90 | Resp 99 | Wt 232.0 lb

## 2024-02-14 DIAGNOSIS — A749 Chlamydial infection, unspecified: Secondary | ICD-10-CM

## 2024-02-14 DIAGNOSIS — G4733 Obstructive sleep apnea (adult) (pediatric): Secondary | ICD-10-CM

## 2024-02-14 DIAGNOSIS — Z202 Contact with and (suspected) exposure to infections with a predominantly sexual mode of transmission: Secondary | ICD-10-CM

## 2024-02-14 MED ORDER — ZEPBOUND 5 MG/0.5ML ~~LOC~~ SOAJ
5.0000 mg | SUBCUTANEOUS | 0 refills | Status: AC
  Filled 2024-02-14: qty 2, 28d supply, fill #0

## 2024-02-14 NOTE — Progress Notes (Signed)
   Elizabeth Stanton 08-12-1990 [id number]   History:  33 y.o. G0 presents for TOC chlamydia and Med follow up after being on zepbound  for Sleep apnea x 4 weeks. Goal is to reduce weight to improve sleep apnea to make her a better surgical candidate.   Gynecologic History Patient's last menstrual period was 01/31/2024 (exact date).   Obstetric History OB History  Gravida Para Term Preterm AB Living  0 0 0 0 0 0  SAB IAB Ectopic Multiple Live Births  0 0 0 0 0       The following portions of the patient's history were reviewed and updated as appropriate: allergies, current medications, past family history, past medical history, past social history, past surgical history, and problem list.  Review of Systems  All other systems reviewed and are negative.   Past medical history, past surgical history, family history and social history were all reviewed and documented in the EPIC chart.  Exam:  Vitals:   02/14/24 0834  BP: 116/72  Pulse: 90  Resp: (!) 99  Weight: 232 lb (105.2 kg)   Body mass index is 38.02 kg/m.  Physical Exam Vitals and nursing note reviewed. Exam conducted with a chaperone present.  Constitutional:      Appearance: Normal appearance. She is well-developed.  Pulmonary:     Effort: Pulmonary effort is normal.  Abdominal:     General: Abdomen is flat.     Palpations: Abdomen is soft.  Genitourinary:    General: Normal vulva.     Vagina: Vaginal discharge present. No erythema, bleeding or lesions.     Cervix: Normal. No discharge, friability, lesion or erythema.     Uterus: Normal.      Adnexa: Right adnexa normal and left adnexa normal.  Neurological:     Mental Status: She is alert.  Psychiatric:        Mood and Affect: Mood normal.        Thought Content: Thought content normal.        Judgment: Judgment normal.      Darice Hoit, CMA present for exam  Starting weight:234 Current weight:232 First goal:210 Overall weight loss  goal:190  Assessment/Plan:   1. Severe obstructive sleep apnea-hypopnea syndrome (Primary) Will increase dose, tolerating well. - tirzepatide  (ZEPBOUND ) 5 MG/0.5ML Pen; Inject 5 mg into the skin once a week.  Dispense: 2 mL; Refill: 0  2. Chlamydia contact, treated - SURESWAB CT/NG/T. vaginalis     Risks and benefits discussed. Continue to focus on protein and fiber. Small frequent meals. Adequate hydration and electrolyte replacement. Will continue to exercise for at least a week including weight bearing exercise.   No follow-ups on file.  GINETTE COZIER B WHNP-BC 8:48 AM 02/14/2024

## 2024-02-15 LAB — SURESWAB CT/NG/T. VAGINALIS
C. trachomatis RNA, TMA: NOT DETECTED
N. gonorrhoeae RNA, TMA: NOT DETECTED
Trichomonas vaginalis RNA: NOT DETECTED

## 2024-02-18 ENCOUNTER — Ambulatory Visit: Payer: Self-pay | Admitting: Radiology

## 2024-02-19 ENCOUNTER — Ambulatory Visit: Admitting: Occupational Therapy

## 2024-02-19 DIAGNOSIS — G8113 Spastic hemiplegia affecting right nondominant side: Secondary | ICD-10-CM

## 2024-02-19 NOTE — Therapy (Signed)
 [facility] [address] [place], [place], [address] Phone: [phone]   Fax:  [phone]  Patient Details  Name: Elizabeth Stanton MRN: [id number] Date of Birth: 1991/02/08 Referring Provider:  Corlis Pagan, NP  Encounter Date: 02/19/2024  Pt arrived, however has not been able to purchase elbow brace yet, and therefore this visit is not needed as all other goals have been addressed. Pt gets botox tomorrow, and is scheduled to return to O.T. 03/04/24 for follow up s/p botox, and hopefully will have brace at that time.   No charge for today   Burnard JINNY Roads, OT 02/19/2024, 8:08 AM  [facility] [address] [place], [place], [address] Phone: [phone]   Fax:  [phone]

## 2024-02-20 ENCOUNTER — Encounter: Payer: Self-pay | Admitting: Neurology

## 2024-02-20 ENCOUNTER — Ambulatory Visit: Admitting: Neurology

## 2024-02-20 ENCOUNTER — Telehealth: Payer: Self-pay | Admitting: Neurology

## 2024-02-20 VITALS — BP 123/76 | HR 74 | Ht 65.5 in | Wt 238.0 lb

## 2024-02-20 DIAGNOSIS — G809 Cerebral palsy, unspecified: Secondary | ICD-10-CM | POA: Diagnosis not present

## 2024-02-20 MED ORDER — INCOBOTULINUMTOXINA 100 UNITS IM SOLR: 400.0000 [IU] | INTRAMUSCULAR | Status: DC

## 2024-02-20 NOTE — Progress Notes (Signed)
 xeomin  100units x 4 vial  Ndc-0259-1610-01 Onu-[id number] Exp-2027/09  s/p Bacteriostatic 0.9% Sodium Chloride - 8mL  Onu:[id number] Expiration: 05/23/25 NDC: 9590803397 Dx: G80.9  WITNESSED BY:A JOSHUA

## 2024-02-20 NOTE — Progress Notes (Signed)
 Chief Complaint  Patient presents with   Injections    Room 15 Pt is spouse  Injection for xeomin     ASSESSMENT AND PLAN  Elizabeth Stanton is a 33 y.o. female   Cerebral palsy with spastic right hemiparesis   Electrical stimulation guided Xeomin  injection, use 400 units, (100 units/2 cc of normal saline)  Right brachialis 100 units Right pectoralis major 100 units Right latissimus dorsi 50 units Right pronator teres 25 units Right flexor digitorum profundus 25 units Left flexor digitorum superficialis 25 units Right brachial radialis 50 units Right palmaris longus 25 units     DIAGNOSTIC DATA (LABS, IMAGING, TESTING) - I reviewed patient records, labs, notes, testing and imaging myself where available.   MEDICAL HISTORY:  Elizabeth Stanton is a 33 year old female, seen in request by her primary care at [facility] NP  Corlis Pagan and my colleague Dr. Chalice for evaluation of Xeomin  injection for spastic right hemiparesis,  History is obtained from the patient and review of electronic medical records. I personally reviewed pertinent available imaging films in PACS.   PMHx of  OSA-CPAP Depression, anxiety Cerebral palsy- Spastic right hemiparesis  She suffered cerebral palsy since birth, with spastic right hemiparesis, involving right upper extremity more than lower extremity, she is independent in her daily activity, work at education, as a Designer, industrial/product, mild unsteady gait, but the most bothersome symptoms is her tight right shoulder, right shoulder pain, limited range of motion of right elbow, right hand spasm,  She is receiving occupational therapy, recommended botulinumtoxin injection, she did have some injection when she was 33 years old, which was helpful, it was interrupted after she moved  CT head in February 2021, focal area of left periventricular white matter hypoattenuation, consistent with remote infarction,  PHYSICAL EXAM:   Vitals:    02/20/24 1419  BP: 123/76  Pulse: 74  Weight: 238 lb (108 kg)  Height: 5' 5.5 (1.664 m)   Body mass index is 39 kg/m.  PHYSICAL EXAMNIATION:  Gen: NAD, conversant, well nourised, well groomed                     Cardiovascular: Regular rate rhythm, no peripheral edema, warm, nontender. Eyes: Conjunctivae clear without exudates or hemorrhage Neck: Supple, no carotid bruits. Pulmonary: Clear to auscultation bilaterally   NEUROLOGICAL EXAM:  MENTAL STATUS: Speech/cognition: Awake, alert, oriented to history taking and casual conversation CRANIAL NERVES: CN II: Visual fields are full to confrontation. Pupils are round equal and briskly reactive to light. CN III, IV, VI: extraocular movement are normal. No ptosis. CN V: Facial sensation is intact to light touch CN VII: Face is symmetric with normal eye closure  CN VIII: Hearing is normal to causal conversation. CN IX, X: Phonation is normal. CN XI: Head turning and shoulder shrug are intact  MOTOR: Hypoplasia of right upper extremity, tendency for right finger flexion, limited range of motion of right elbow, maximum extension 150 degree, pronation, tight right shoulder, significant tenderness of right pectoralis major, latissimus dorsi  REFLEXES: Hyperreflexia of right upper and lower extremity  SENSORY: Intact to light touch  COORDINATION: There is no trunk or limb dysmetria noted.  GAIT/STANCE: Dragging right leg some  REVIEW OF SYSTEMS:  Full 14 system review of systems performed and notable only for as above All other review of systems were negative.   ALLERGIES: Allergies  Allergen Reactions   Lavender Oil Hives    HOME MEDICATIONS: Current Outpatient Medications  Medication  Sig Dispense Refill   citalopram  (CELEXA ) 10 MG tablet Take 1 tablet (10 mg total) by mouth daily. Add to the 20 mg tablet for a total dose of 30 mg daily 90 tablet 3   citalopram  (CELEXA ) 20 MG tablet Take one tablet a day. Add to  the 10 mg tablet for a total dose of 30 mg a day. 90 tablet 3   levonorgestrel  (MIRENA ) 20 MCG/24HR IUD 1 each by Intrauterine route once.     tirzepatide  (ZEPBOUND ) 5 MG/0.5ML Pen Inject 5 mg into the skin once a week. 2 mL 0   doxycycline  (VIBRA -TABS) 100 MG tablet Take 1 tablet (100 mg total) by mouth 2 (two) times daily. 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)     PAST SURGICAL HISTORY: Past Surgical History:  Procedure Laterality Date   COLPOSCOPY      FAMILY HISTORY: Family History  Problem Relation Age of Onset   Hypertension Mother    Diabetes Mother    Sleep apnea Father    Hypertension Maternal Grandmother    Cancer Paternal Grandfather        skin    SOCIAL HISTORY: Social History   Socioeconomic History   Marital status: Married    Spouse name: Not on file   Number of children: Not on file   Years of education: Not on file   Highest education level: Not on file  Occupational History   Not on file  Tobacco Use   Smoking status: Never    Passive exposure: Past   Smokeless tobacco: Never  Vaping Use   Vaping status: Never Used  Substance and Sexual Activity   Alcohol use: Yes    Alcohol/week: 1.0 standard drink of alcohol    Types: 1 Standard drinks or equivalent per week    Comment: wine   Drug use: No   Sexual activity: Yes    Partners: Male    Birth control/protection: I.U.D.     Comment: menarche 33yo, sexual deubt 20's  Other Topics Concern   Not on file  Social History Narrative   Not on file   Social Drivers of Health   Financial Resource Strain: Not on file  Food Insecurity: No Food Insecurity (12/20/2023)   Hunger Vital Sign    Worried About Running Out of Food in the Last Year: Never true    Ran Out of Food in the Last Year: Never true  Transportation Needs: No Transportation Needs (12/20/2023)   PRAPARE - Administrator, Civil Service (Medical): No    Lack of Transportation (Non-Medical): No  Physical Activity: Not on file  Stress: Not on file  Social Connections: Not on file  Intimate Partner Violence: Not At Risk (12/20/2023)   Humiliation, Afraid, Rape, and Kick questionnaire    Fear of Current or Ex-Partner: No    Emotionally  Abused: No    Physically Abused: No    Sexually Abused: No      Modena Callander, M.D. Ph.D.  [facility] [address] [place], [place] [address] Ph: [phone] Fax: [phone]  CC:  Corlis Pagan, NP [address] [place],  [place] [address]  Corlis Pagan, NP

## 2024-02-20 NOTE — Telephone Encounter (Signed)
 Please do not dissolve xeomin  for next injection in Oct 2025, until I finish talking with her

## 2024-02-25 ENCOUNTER — Other Ambulatory Visit (HOSPITAL_BASED_OUTPATIENT_CLINIC_OR_DEPARTMENT_OTHER): Payer: Self-pay

## 2024-03-04 ENCOUNTER — Encounter: Payer: Self-pay | Admitting: Occupational Therapy

## 2024-03-04 ENCOUNTER — Ambulatory Visit: Attending: Student | Admitting: Occupational Therapy

## 2024-03-04 DIAGNOSIS — G8113 Spastic hemiplegia affecting right nondominant side: Secondary | ICD-10-CM | POA: Diagnosis present

## 2024-03-04 DIAGNOSIS — M6281 Muscle weakness (generalized): Secondary | ICD-10-CM | POA: Diagnosis present

## 2024-03-04 DIAGNOSIS — R293 Abnormal posture: Secondary | ICD-10-CM | POA: Insufficient documentation

## 2024-03-04 DIAGNOSIS — M25621 Stiffness of right elbow, not elsewhere classified: Secondary | ICD-10-CM | POA: Insufficient documentation

## 2024-03-04 NOTE — Patient Instructions (Signed)
  Sit to Rt of table - slide Rt arm out to side along table (towel underneath elbow) until stretch is felt. Hold 5-10 sec  2. Closed Chain: Shoulder Abduction - Counter Level    One hand on counter, step to side. Stepping out causes shoulder to abduct. Hold 10 sec Step _5__ times, each side, __2_ times per day.  3. Extension (Passive)    Place thick telephone book on table and rest upper arm on it. Grasp forearm with other hand and use a steady downward and outward pull to straighten elbow. Hold __10__ seconds. Try stretching with palm down, then palm/thumb up as able Repeat _5___ times. Do __2__ sessions per day.

## 2024-03-04 NOTE — Therapy (Signed)
 OUTPATIENT OCCUPATIONAL THERAPY NEURO TREATMENT/RENEWAL/DISCHARGE  Patient Name: Elizabeth Stanton MRN: [id number] DOB:1991/06/15, 33 y.o., female Today's Date: 03/04/2024  PCP: Corlis Pagan, NP REFERRING PROVIDER: Andris Estefana BRAVO, PA-C  OCCUPATIONAL THERAPY DISCHARGE SUMMARY  Visits from Start of Care: 6  Current functional level related to goals / functional outcomes: SEE BELOW   Remaining deficits: Spasticity RUE   Education / Equipment: Pt provided with HEP's, A/E recommendations, splint wear and care and brace ino   Patient agrees to discharge. Patient goals were met. Patient is being discharged due to meeting the stated rehab goals..     END OF SESSION:  OT End of Session - 03/04/24 0805     Visit Number 6    Number of Visits 8    Date for OT Re-Evaluation 03/04/24    Authorization Type [employer] - no auth required    OT Start Time 0802    OT Stop Time 0835    OT Time Calculation (min) 33 min    Activity Tolerance Patient tolerated treatment well    Behavior During Therapy [facility] for tasks assessed/performed          Past Medical History:  Diagnosis Date   Allergy Lavender   Anxiety 11/02/2020   Cerebral palsy (HCC)    Depression 11/02/2020   Dysmenorrhea    History of abnormal cervical Pap smear    + HPV   History of cerebral palsy    History of pulmonary embolism    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 [facility])    Past Surgical History:  Procedure Laterality Date   COLPOSCOPY     Patient Active Problem List   Diagnosis Date Noted   Symptomatic mammary hypertrophy 11/05/2023   Personal history of pulmonary embolism 05/17/2023   Severe obstructive sleep apnea-hypopnea syndrome 02/14/2023   Snoring 01/16/2023   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 Splint still going well, but haven't purchased the elbow brace yet d/t finances. I got the botox - it was painful, but it helped, my arm feels looser.   Chronic pain both hips and knees (Rt side worse) and Rt elbow, upper traps, Rt side of spine and lower back 5-7/10 My arm from elbow down gets cold and fingernails turn blue b/c I forget to move it.  Pt accompanied by: self  PERTINENT HISTORY: baseline hemiplegia d/t CP, CP, anxiety, depression, h/o PE, Mammary hypertrophy  PRECAUTIONS: Fall  WEIGHT BEARING RESTRICTIONS: No  PAIN:  Are you having pain? Chronic (see subjective)   FALLS: Has patient fallen in last 6 months? No  LIVING ENVIRONMENT: Lives with: lives with their spouse Lives in: first floor apt Has following equipment at home: None  PLOF: Independent with basic ADLs, Vocation/Vocational requirements: office work, and Leisure: reading *Uses RUE as stabilizer to min assist  PATIENT GOALS: to get brace/splint for day and night  OBJECTIVE:  Note: Objective measures were completed at Evaluation unless otherwise noted.  HAND DOMINANCE: Left  ADLs: Overall ADLs: mod I to occasional min assist Transfers/ambulation related to ADLs: independent Eating: husband cuts food Grooming: difficulty styling hair UB Dressing: bra difficult and gets husband to hook at times, wearing sports bras now LB Dressing: difficulty pulling up pants/underwear on Rt side, has husband tie shoes or wears slip on shoes Toileting: wiping difficult, difficulty pulling up clothes on Rt side Bathing: mod I with grab bars, LH  sponge Tub Shower transfers: mod I w/ use of grab bars Equipment: Grab bars - would benefit from shower chair or bench for balance  IADLs: Shopping: pt/husband do together Light housekeeping: husband doing the last 5 years Meal Prep: Pt does minimal cooking, husband does most cooking Community mobility: pt drives Medication management: independent Landscape architect: independent Handwriting: Lt handed - no issues  MOBILITY STATUS: Independent   UPPER EXTREMITY ROM:  LUE AROM WNL's. RUE AROM WFL's at shoulder. Elbow ext approx -45*, wrist ext to neutral but compensating in fingers w/ hyperextension in PIP joints and sometimes in MP joints. Pt with difficulty making fist d/t spasticity.  However, passively can get pt easily into resting hand position  HAND FUNCTION: Pt uses RUE more proximally as stabilizer to min assist but does not use Rt hand  COORDINATION: Doe not use Rt hand for function  SENSATION: Diminished, tingling and sometimes cold Rt hand  EDEMA: mild dorsal Rt wrist  MUSCLE TONE: RUE: Hypertonic and dystonia   COGNITION: Overall cognitive status: Within functional limits for tasks assessed  VISION: Subjective report: vision ok right now Baseline vision: Wears glasses all the time Visual history: mild scotomas  PERCEPTION: Not tested  PRAXIS: Not tested  OBSERVATIONS: very pleasant, good insight, Rt spastic hemiplegia from CP (elbow distally worse)                                                                                                                              TREATMENT DATE: 03/04/24   Renewal to cover today's visit due to outside original POC, but also d/c today per previous plan  Pt issued additional stretches following botox (botox to RUE on 02/20/24)  - see pt instructions for details.   Pt also given info on Posey elbow splint that she can purchase until she can financially go through [employer] for more custom dynamic brace.  Pt fitted for medium Posey elbow splint     PATIENT EDUCATION: Education details: see above Person educated: Patient Education method: Explanation, Demonstration, Verbal cues, and Handouts Education comprehension: verbalized understanding and returned demonstration  HOME EXERCISE PROGRAM: 01/28/24: RUE HEP  03/04/24: Additional stretches following botox   GOALS: Goals reviewed with patient? Yes  SHORT TERM GOALS: Target date: 01/25/24  Independent with splint wear and care for Rt hand/wrist (daytime and pm wear)  Baseline: Goal status: MET  2.  Pt to verbalize understanding of A/E and DME needs to increase ease/independence and safety with ADLS (rocker knife, one handed cutting board, shoe  lace options, one handed can and jar openers, shower chair, adapted hair clips, and bra angel)  Baseline:  Goal status: MET    LONG TERM GOALS: Target date: 02/15/24  Independent with HEP for RUE Baseline:  Goal status: MET  2.  Pt to obtain elbow orthotic for spasticity management and to hopefully increase elbow extension RUE Baseline:  Goal status: DEFERRED for now d/t pt finances - however pt given info on more cost efficient elbow brace she can order (Posey)    ASSESSMENT:  CLINICAL IMPRESSION: Patient was seen today for last visit following botox to RUE on 02/20/24, however today's visit was outside original plan of care, therefore today will be renewal for today's visit and discharge. Pt has met all goals and agrees with d/c   PERFORMANCE DEFICITS: in functional skills including ADLs, IADLs, coordination, sensation, edema, tone, ROM, mobility, balance, body mechanics, decreased knowledge of use of DME, and UE functional use.   IMPAIRMENTS: are limiting patient from ADLs and IADLs.   CO-MORBIDITIES: may have co-morbidities  that affects occupational performance. Patient will benefit from skilled OT to address above impairments and improve overall function.  MODIFICATION OR  ASSISTANCE TO COMPLETE EVALUATION: No modification of tasks or assist necessary to complete an evaluation.  OT OCCUPATIONAL PROFILE AND HISTORY: Problem focused assessment: Including review of records relating to presenting problem.  CLINICAL DECISION MAKING: LOW - limited treatment options, no task modification necessary  REHAB POTENTIAL: Good  EVALUATION COMPLEXITY: Low    PLAN:  OT FREQUENCY: one time visit  OT DURATION: other: today (03/04/24)   PLANNED INTERVENTIONS: 97535 self care/ADL training, 02889 therapeutic exercise, 97530 therapeutic activity, 97112 neuromuscular re-education, 97140 manual therapy, 97760 Orthotic Initial, [id number] Prosthetic Initial, [id number] Orthotic/Prosthetic subsequent, passive range of motion, functional mobility training, patient/family education, and DME and/or AE instructions  RECOMMENDED OTHER SERVICES: Recommended pt discuss with neurologist possible botox for RUE elbow and wrist flexors  CONSULTED AND AGREED WITH PLAN OF CARE: Patient  PLAN  D/C  Burnard JINNY Roads, OT 03/04/2024, 8:36 AM

## 2024-03-10 ENCOUNTER — Telehealth (INDEPENDENT_AMBULATORY_CARE_PROVIDER_SITE_OTHER): Payer: Self-pay | Admitting: Otolaryngology

## 2024-03-10 NOTE — Telephone Encounter (Signed)
 LVM to confirm appt & location [phone] afm

## 2024-03-11 ENCOUNTER — Institutional Professional Consult (permissible substitution) (INDEPENDENT_AMBULATORY_CARE_PROVIDER_SITE_OTHER): Admitting: Otolaryngology

## 2024-03-12 ENCOUNTER — Ambulatory Visit: Admitting: Radiology

## 2024-03-17 ENCOUNTER — Other Ambulatory Visit: Payer: Self-pay

## 2024-03-17 DIAGNOSIS — F418 Other specified anxiety disorders: Secondary | ICD-10-CM

## 2024-03-17 NOTE — Telephone Encounter (Signed)
 When Patient called this morning, she said she was requesting 30 mgs.

## 2024-03-17 NOTE — Telephone Encounter (Signed)
 Need clarification as to what dose she is taking. Please contact patient.

## 2024-03-17 NOTE — Telephone Encounter (Addendum)
 Pt called to request both the 10 mg and 20 mg refills of the following: Citalopram  (Celexa )   Date Started: 01/10/23 - #90 tablets with 3 refills    Last AEX: 01/10/24 Next AEX: not yet scheduled  Refill authorized? Please advise.

## 2024-03-18 ENCOUNTER — Other Ambulatory Visit: Payer: Self-pay | Admitting: Radiology

## 2024-03-18 DIAGNOSIS — F418 Other specified anxiety disorders: Secondary | ICD-10-CM

## 2024-03-18 MED ORDER — CITALOPRAM HYDROBROMIDE 20 MG PO TABS: ORAL_TABLET | ORAL | 3 refills | Status: AC

## 2024-03-18 MED ORDER — CITALOPRAM HYDROBROMIDE 10 MG PO TABS
10.0000 mg | ORAL_TABLET | Freq: Every day | ORAL | 3 refills | Status: AC
Start: 2024-03-18 — End: ?

## 2024-03-18 NOTE — Telephone Encounter (Addendum)
 Thank you :)

## 2024-04-08 ENCOUNTER — Other Ambulatory Visit: Payer: Self-pay

## 2024-04-24 ENCOUNTER — Telehealth (INDEPENDENT_AMBULATORY_CARE_PROVIDER_SITE_OTHER): Payer: Self-pay | Admitting: Otolaryngology

## 2024-04-24 NOTE — Telephone Encounter (Signed)
 LVM for patient to call back to reschedule - Provider not in office

## 2024-04-25 ENCOUNTER — Institutional Professional Consult (permissible substitution) (INDEPENDENT_AMBULATORY_CARE_PROVIDER_SITE_OTHER): Admitting: Otolaryngology

## 2024-04-25 ENCOUNTER — Encounter (INDEPENDENT_AMBULATORY_CARE_PROVIDER_SITE_OTHER): Payer: Self-pay

## 2024-04-25 ENCOUNTER — Telehealth (INDEPENDENT_AMBULATORY_CARE_PROVIDER_SITE_OTHER): Payer: Self-pay | Admitting: Otolaryngology

## 2024-04-25 NOTE — Telephone Encounter (Signed)
 Called patient and LVM requesting a call back to r/s due to Provider out of office. Sent MyChart msg.

## 2024-04-28 ENCOUNTER — Telehealth: Payer: Self-pay | Admitting: Neurology

## 2024-04-28 ENCOUNTER — Other Ambulatory Visit (HOSPITAL_COMMUNITY): Payer: Self-pay

## 2024-05-07 ENCOUNTER — Institutional Professional Consult (permissible substitution) (INDEPENDENT_AMBULATORY_CARE_PROVIDER_SITE_OTHER): Admitting: Otolaryngology

## 2024-05-13 ENCOUNTER — Telehealth (INDEPENDENT_AMBULATORY_CARE_PROVIDER_SITE_OTHER): Payer: Self-pay | Admitting: Otolaryngology

## 2024-05-13 NOTE — Telephone Encounter (Signed)
 LVM to r/s appointment 05/29/24

## 2024-05-19 ENCOUNTER — Ambulatory Visit: Payer: BC Managed Care – PPO | Admitting: Neurology

## 2024-05-20 ENCOUNTER — Other Ambulatory Visit (HOSPITAL_COMMUNITY): Payer: Self-pay

## 2024-05-20 ENCOUNTER — Other Ambulatory Visit: Payer: Self-pay

## 2024-05-20 MED ORDER — INCOBOTULINUMTOXINA 100 UNITS IM SOLR
400.0000 [IU] | INTRAMUSCULAR | 3 refills | Status: DC
  Filled 2024-05-20: qty 4, 84d supply, fill #0

## 2024-05-20 MED ORDER — INCOBOTULINUMTOXINA 100 UNITS IM SOLR
400.0000 [IU] | INTRAMUSCULAR | 3 refills | Status: DC
  Filled 2024-05-20: qty 4, 90d supply, fill #0

## 2024-05-20 NOTE — Addendum Note (Signed)
 Addended by: ONEITA HOIST E on: 05/20/2024 11:27 AM   Modules accepted: Orders

## 2024-05-20 NOTE — Telephone Encounter (Signed)
 Resent order for xeomin 

## 2024-05-20 NOTE — Telephone Encounter (Signed)
 Refill sent as requested.

## 2024-05-20 NOTE — Addendum Note (Signed)
 Addended by: ONEITA NEVELYN BRAVO on: 05/20/2024 09:22 AM   Modules accepted: Orders

## 2024-05-20 NOTE — Telephone Encounter (Signed)
 Please send Xeomin  refill to [facility]. She gets 400 units total.

## 2024-05-20 NOTE — Progress Notes (Signed)
 Specialty Pharmacy Refill Coordination Note  Clear Bag Patient  Elizabeth Stanton is a 33 y.o. female contacted today regarding refills of specialty medication(s) IncobotulinumtoxinA  (XEOMIN )  Doses on hand: 0   Injection appointment: 05/28/24  Patient requested: Courier to Provider Office   Delivery date: 05/26/24   Verified address: [facility], [address] [place] [address]  Medication will be filled on 05/23/24 .

## 2024-05-22 ENCOUNTER — Other Ambulatory Visit: Payer: Self-pay

## 2024-05-28 ENCOUNTER — Ambulatory Visit: Admitting: Neurology

## 2024-05-28 ENCOUNTER — Encounter: Payer: Self-pay | Admitting: Neurology

## 2024-05-28 DIAGNOSIS — J351 Hypertrophy of tonsils: Secondary | ICD-10-CM | POA: Insufficient documentation

## 2024-05-28 DIAGNOSIS — G809 Cerebral palsy, unspecified: Secondary | ICD-10-CM

## 2024-05-28 NOTE — Progress Notes (Addendum)
 Chief Complaint  Patient presents with   RM15/XEOMIN     Pt is here Alone.     ASSESSMENT AND PLAN  Elizabeth Stanton is a 33 y.o. female   Cerebral palsy with spastic right hemiparesis   Electrical stimulation guided Xeomin  injection, use 400 units, (100 units/2 cc of normal saline)  Right brachialis 50 units Right pectoralis major 100 units Right latissimus dorsi 100 units Right pronator teres 25 units Right flexor digitorum profundus 25 units Left flexor digitorum superficialis 25 units Right brachial radialis 50 units Right palmaris longus 25 units  Will decrease to lower dose of Xeomin  at next injection 300 units  DIAGNOSTIC DATA (LABS, IMAGING, TESTING) - I reviewed patient records, labs, notes, testing and imaging myself where available.   MEDICAL HISTORY:  Elizabeth Stanton is a 33 year old female, seen in request by her primary care at [facility] NP  Corlis Pagan and my colleague Dr. Chalice for evaluation of Xeomin  injection for spastic right hemiparesis,  History is obtained from the patient and review of electronic medical records. I personally reviewed pertinent available imaging films in PACS.   PMHx of  OSA-CPAP Depression, anxiety Cerebral palsy- Spastic right hemiparesis  She suffered cerebral palsy since birth, with spastic right hemiparesis, involving right upper extremity more than lower extremity, she is independent in her daily activity, work at education, as a designer, industrial/product, mild unsteady gait, but the most bothersome symptoms is her tight right shoulder, right shoulder pain, limited range of motion of right elbow, right hand spasm,  She is receiving occupational therapy, recommended botulinumtoxin injection, she did have some injection when she was 33 years old, which was helpful, it was interrupted after she moved  CT head in February 2021, focal area of left periventricular white matter hypoattenuation, consistent with remote  infarction,  UPDATE Nov 5th 2025: She had her first injection on February 20, 2024, which was helpful, much relaxed right shoulder, also better range of motion of right elbow, wrist and fingers   PHYSICAL EXAM:    MOTOR: Hypoplasia of right upper extremity, tendency for right finger flexion, limited range of motion of right elbow, maximum extension 150 degree, pronation, tight right shoulder, significant tenderness of right pectoralis major, latissimus dorsi   COORDINATION: There is no trunk or limb dysmetria noted.  GAIT/STANCE: Dragging right leg some, but fairly steady  REVIEW OF SYSTEMS:  Full 14 system review of systems performed and notable only for as above All other review of systems were negative.   ALLERGIES: Allergies  Allergen Reactions   Lavender Oil Hives    HOME MEDICATIONS: Current Outpatient Medications  Medication Sig Dispense Refill   citalopram  (CELEXA ) 10 MG tablet Take 1 tablet (10 mg total) by mouth daily. Add to the 20 mg tablet for a total dose of 30 mg daily 90 tablet 3   citalopram  (CELEXA ) 20 MG tablet Take one tablet a day. Add to the 10 mg tablet for a total dose of 30 mg a day. 90 tablet 3   incobotulinumtoxinA  (XEOMIN ) 100 units SOLR injection Inject 400 Units into the muscle every 3 (three) months. 4 each 3   levonorgestrel  (MIRENA ) 20 MCG/24HR IUD 1 each by Intrauterine route once.     doxycycline  (VIBRA -TABS) 100 MG tablet Take 1 tablet (100 mg total) by mouth 2 (two) times daily. (Patient not taking: Reported on 02/14/2024) 14 tablet 0   tirzepatide  (ZEPBOUND ) 5 MG/0.5ML Pen Inject 5 mg into the skin once a week. 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)     PAST SURGICAL HISTORY: Past Surgical History:  Procedure Laterality Date   COLPOSCOPY      FAMILY HISTORY: Family History  Problem Relation Age of Onset   Hypertension Mother    Diabetes Mother    Sleep apnea Father    Hypertension Maternal Grandmother    Cancer Paternal Grandfather        skin    SOCIAL HISTORY: Social History   Socioeconomic History   Marital status: Legally Separated    Spouse name: Not on file   Number of children: Not on file   Years of education: Not on file   Highest education level: Not on file  Occupational History   Not on file  Tobacco Use   Smoking status: Never    Passive exposure: Past   Smokeless tobacco: Never  Vaping Use   Vaping status: Never Used  Substance and Sexual Activity   Alcohol use: Yes    Alcohol/week: 1.0 standard drink of alcohol    Types: 1 Standard drinks or equivalent per week    Comment: wine   Drug use: No   Sexual activity: Yes    Partners: Male    Birth control/protection: I.U.D.    Comment: menarche 33yo, sexual deubt 20's  Other Topics Concern   Not on file  Social History Narrative   Not on file   Social Drivers of Health   Financial Resource Strain: Not on file  Food Insecurity: No Food Insecurity (12/20/2023)   Hunger Vital Sign    Worried About Running Out of Food in the Last Year: Never true    Ran Out of Food in the Last Year: Never true  Transportation Needs: No Transportation Needs (12/20/2023)   PRAPARE - Administrator, Civil Service (Medical): No    Lack of Transportation (Non-Medical): No  Physical Activity: Not on file  Stress: Not on file  Social Connections: Not on file  Intimate Partner  Violence: Not At Risk (12/20/2023)   Humiliation, Afraid, Rape, and Kick questionnaire    Fear of Current or Ex-Partner: No    Emotionally Abused: No    Physically Abused: No    Sexually Abused: No      Duos Onita, M.D. Ph.D.  [facility] [address], [address] [place], [place] [address] Ph: [phone] Fax: [phone]  CC:  Corlis Pagan, NP [address] [place],  [place] [address]  Corlis Pagan, NP

## 2024-05-29 ENCOUNTER — Encounter (INDEPENDENT_AMBULATORY_CARE_PROVIDER_SITE_OTHER): Payer: Self-pay

## 2024-05-29 ENCOUNTER — Institutional Professional Consult (permissible substitution) (INDEPENDENT_AMBULATORY_CARE_PROVIDER_SITE_OTHER): Admitting: Otolaryngology

## 2024-05-29 MED ORDER — INCOBOTULINUMTOXINA 100 UNITS IM SOLR: 400.0000 [IU] | INTRAMUSCULAR | Status: DC

## 2024-05-29 NOTE — Progress Notes (Signed)
 xeomin  100units x 4 vial  [phone] Exp-2027/12 s/p Bacteriostatic 0.9% Sodium Chloride - 8mL  Onu:[id number] Expiration: 05/23/25 NDC: [id number] Dx: G80.9 WITNESSED BY:DR. YAN

## 2024-05-31 ENCOUNTER — Telehealth: Payer: Self-pay | Admitting: Neurology

## 2024-05-31 NOTE — Telephone Encounter (Signed)
  Will decrease to lower dose of Xeomin  at next injection 300 units

## 2024-06-02 ENCOUNTER — Other Ambulatory Visit: Payer: Self-pay

## 2024-06-02 MED ORDER — INCOBOTULINUMTOXINA 100 UNITS IM SOLR
300.0000 [IU] | INTRAMUSCULAR | 3 refills | Status: AC
  Filled 2024-06-02 – 2024-08-13 (×2): qty 3, 90d supply, fill #0

## 2024-06-02 NOTE — Addendum Note (Signed)
 Addended by: JOSHUA MAURILIO CROME on: 06/02/2024 09:22 AM   Modules accepted: Orders

## 2024-06-02 NOTE — Telephone Encounter (Signed)
Updated rx sent in.

## 2024-06-27 ENCOUNTER — Encounter: Payer: Self-pay | Admitting: Nurse Practitioner

## 2024-06-27 ENCOUNTER — Ambulatory Visit: Admitting: Nurse Practitioner

## 2024-06-27 VITALS — BP 110/76 | HR 87

## 2024-06-27 DIAGNOSIS — N898 Other specified noninflammatory disorders of vagina: Secondary | ICD-10-CM

## 2024-06-27 DIAGNOSIS — B9689 Other specified bacterial agents as the cause of diseases classified elsewhere: Secondary | ICD-10-CM | POA: Diagnosis not present

## 2024-06-27 DIAGNOSIS — N760 Acute vaginitis: Secondary | ICD-10-CM | POA: Diagnosis not present

## 2024-06-27 DIAGNOSIS — Z113 Encounter for screening for infections with a predominantly sexual mode of transmission: Secondary | ICD-10-CM | POA: Diagnosis not present

## 2024-06-27 LAB — WET PREP FOR TRICH, YEAST, CLUE

## 2024-06-27 MED ORDER — METRONIDAZOLE 500 MG PO TABS: 500.0000 mg | ORAL_TABLET | Freq: Two times a day (BID) | ORAL | 0 refills | Status: AC

## 2024-06-27 NOTE — Progress Notes (Signed)
   Acute Office Visit  Subjective:    Patient ID: Elizabeth Stanton, female    DOB: [birth year], 33 y.o.   MRN: [id number]   HPI 33 y.o. presents today for vaginal discharge. Denies itching or odor. Would like STD screening. New partner.   Patient's last menstrual period was 06/21/2024 (exact date). Period Duration (Days): 4-5 Period Pattern: Regular Menstrual Flow: Light, Moderate Menstrual Control: Tampon Dysmenorrhea: (!) Moderate Dysmenorrhea Symptoms: Cramping  Review of Systems  Constitutional: Negative.   Genitourinary:  Positive for vaginal discharge. Negative for vaginal pain.       Objective:    Physical Exam Constitutional:      Appearance: Normal appearance.  Genitourinary:    General: Normal vulva.     Vagina: Vaginal discharge present. No erythema.     Cervix: Normal.     BP 110/76   Pulse 87   LMP 06/21/2024 (Exact Date)   SpO2 99%  Wt Readings from Last 3 Encounters:  02/20/24 238 lb (108 kg)  02/14/24 232 lb (105.2 kg)  01/10/24 234 lb 3.2 oz (106.2 kg)        Elizabeth Stanton, CMA present as chaperone.   Wet prep + clue cells (+ odor)  Assessment & Plan:   Problem List Items Addressed This Visit   None Visit Diagnoses       Bacterial vaginosis    -  Primary   Relevant Medications   metroNIDAZOLE  (FLAGYL ) 500 MG tablet     Vaginal discharge       Relevant Orders   WET PREP FOR TRICH, YEAST, CLUE     Screening examination for STD (sexually transmitted disease)       Relevant Orders   C. trachomatis/N. gonorrhoeae RNA   RPR W/RFLX TO RPR TITER, TREPONEMAL AB, SCREEN AND DIAGNOSIS   HIV Antibody (routine testing w rflx)      Plan: Wet prep positive for clue cells - Flagyl  500 mg BID x 7 days. STD panel pending.   Return if symptoms worsen or fail to improve.    Elizabeth DELENA Shutter DNP, 10:02 AM 06/27/2024

## 2024-06-28 LAB — HIV ANTIBODY (ROUTINE TESTING W REFLEX)
HIV 1&2 Ab, 4th Generation: NONREACTIVE
HIV FINAL INTERPRETATION: NEGATIVE

## 2024-06-28 LAB — SYPHILIS: RPR W/REFLEX TO RPR TITER AND TREPONEMAL ANTIBODIES, TRADITIONAL SCREENING AND DIAGNOSIS ALGORITHM: RPR Ser Ql: NONREACTIVE

## 2024-06-28 LAB — C. TRACHOMATIS/N. GONORRHOEAE RNA
C. trachomatis RNA, TMA: NOT DETECTED
N. gonorrhoeae RNA, TMA: NOT DETECTED

## 2024-06-29 ENCOUNTER — Ambulatory Visit: Payer: Self-pay | Admitting: Nurse Practitioner

## 2024-08-13 ENCOUNTER — Other Ambulatory Visit: Payer: Self-pay | Admitting: Pharmacy Technician

## 2024-08-13 ENCOUNTER — Other Ambulatory Visit: Payer: Self-pay

## 2024-08-13 NOTE — Progress Notes (Signed)
 Specialty Pharmacy Refill Coordination Note  Elizabeth Stanton is a 34 y.o. female assessed today regarding refills of clinic administered specialty medication(s) IncobotulinumtoxinA  (XEOMIN )   Clinic requested Courier to Provider Office   Delivery date: 08/27/24   Verified address: [facility], [address] [place] [address]   Medication will be filled on: 08/26/24

## 2024-08-22 ENCOUNTER — Telehealth

## 2024-08-26 ENCOUNTER — Other Ambulatory Visit: Payer: Self-pay

## 2024-09-03 ENCOUNTER — Ambulatory Visit: Admitting: Neurology
